# Patient Record
Sex: Female | Born: 1935 | Race: White | Hispanic: No | Smoking: Never smoker
Health system: Southern US, Community
[De-identification: ages and names within clinical notes are randomized; demographics above are authoritative.]

## PROBLEM LIST (undated history)

## (undated) DIAGNOSIS — M810 Age-related osteoporosis without current pathological fracture: Secondary | ICD-10-CM

## (undated) DIAGNOSIS — I219 Acute myocardial infarction, unspecified: Secondary | ICD-10-CM

## (undated) DIAGNOSIS — Z8614 Personal history of Methicillin resistant Staphylococcus aureus infection: Secondary | ICD-10-CM

## (undated) DIAGNOSIS — G4700 Insomnia, unspecified: Secondary | ICD-10-CM

## (undated) DIAGNOSIS — K2100 Gastro-esophageal reflux disease with esophagitis, without bleeding: Secondary | ICD-10-CM

## (undated) DIAGNOSIS — K219 Gastro-esophageal reflux disease without esophagitis: Secondary | ICD-10-CM

## (undated) DIAGNOSIS — R911 Solitary pulmonary nodule: Secondary | ICD-10-CM

## (undated) DIAGNOSIS — R0609 Other forms of dyspnea: Secondary | ICD-10-CM

## (undated) DIAGNOSIS — J90 Pleural effusion, not elsewhere classified: Secondary | ICD-10-CM

## (undated) DIAGNOSIS — F329 Major depressive disorder, single episode, unspecified: Secondary | ICD-10-CM

## (undated) DIAGNOSIS — J209 Acute bronchitis, unspecified: Secondary | ICD-10-CM

## (undated) DIAGNOSIS — R531 Weakness: Secondary | ICD-10-CM

## (undated) DIAGNOSIS — L02838 Carbuncle of other sites: Secondary | ICD-10-CM

## (undated) DIAGNOSIS — D72829 Elevated white blood cell count, unspecified: Secondary | ICD-10-CM

## (undated) DIAGNOSIS — J449 Chronic obstructive pulmonary disease, unspecified: Secondary | ICD-10-CM

## (undated) DIAGNOSIS — I4891 Unspecified atrial fibrillation: Secondary | ICD-10-CM

## (undated) DIAGNOSIS — S0100XA Unspecified open wound of scalp, initial encounter: Secondary | ICD-10-CM

## (undated) DIAGNOSIS — I517 Cardiomegaly: Secondary | ICD-10-CM

## (undated) DIAGNOSIS — R0989 Other specified symptoms and signs involving the circulatory and respiratory systems: Secondary | ICD-10-CM

## (undated) DIAGNOSIS — D649 Anemia, unspecified: Secondary | ICD-10-CM

## (undated) DIAGNOSIS — K209 Esophagitis, unspecified without bleeding: Secondary | ICD-10-CM

## (undated) DIAGNOSIS — R627 Adult failure to thrive: Secondary | ICD-10-CM

## (undated) DIAGNOSIS — R269 Unspecified abnormalities of gait and mobility: Secondary | ICD-10-CM

## (undated) DIAGNOSIS — H6120 Impacted cerumen, unspecified ear: Secondary | ICD-10-CM

## (undated) DIAGNOSIS — F411 Generalized anxiety disorder: Secondary | ICD-10-CM

## (undated) DIAGNOSIS — K56609 Unspecified intestinal obstruction, unspecified as to partial versus complete obstruction: Secondary | ICD-10-CM

## (undated) DIAGNOSIS — L02828 Furuncle of other sites: Secondary | ICD-10-CM

## (undated) DIAGNOSIS — I2510 Atherosclerotic heart disease of native coronary artery without angina pectoris: Secondary | ICD-10-CM

## (undated) DIAGNOSIS — I10 Essential (primary) hypertension: Secondary | ICD-10-CM

## (undated) DIAGNOSIS — K269 Duodenal ulcer, unspecified as acute or chronic, without hemorrhage or perforation: Secondary | ICD-10-CM

## (undated) DIAGNOSIS — K449 Diaphragmatic hernia without obstruction or gangrene: Secondary | ICD-10-CM

## (undated) DIAGNOSIS — W19XXXA Unspecified fall, initial encounter: Secondary | ICD-10-CM

## (undated) DIAGNOSIS — J4489 Other specified chronic obstructive pulmonary disease: Secondary | ICD-10-CM

## (undated) DIAGNOSIS — R609 Edema, unspecified: Secondary | ICD-10-CM

## (undated) DIAGNOSIS — F419 Anxiety disorder, unspecified: Secondary | ICD-10-CM

## (undated) DIAGNOSIS — F3289 Other specified depressive episodes: Secondary | ICD-10-CM

## (undated) DIAGNOSIS — J3489 Other specified disorders of nose and nasal sinuses: Secondary | ICD-10-CM

## (undated) DIAGNOSIS — E119 Type 2 diabetes mellitus without complications: Secondary | ICD-10-CM

## (undated) DIAGNOSIS — K5900 Constipation, unspecified: Secondary | ICD-10-CM

## (undated) DIAGNOSIS — R7989 Other specified abnormal findings of blood chemistry: Secondary | ICD-10-CM

## (undated) DIAGNOSIS — R259 Unspecified abnormal involuntary movements: Secondary | ICD-10-CM

## (undated) DIAGNOSIS — I509 Heart failure, unspecified: Secondary | ICD-10-CM

## (undated) DIAGNOSIS — R634 Abnormal weight loss: Secondary | ICD-10-CM

## (undated) HISTORY — DX: Furuncle of other sites: L02.828

## (undated) HISTORY — DX: Adult failure to thrive: R62.7

## (undated) HISTORY — DX: Other specified abnormal findings of blood chemistry: R79.89

## (undated) HISTORY — DX: Unspecified abnormal involuntary movements: R25.9

## (undated) HISTORY — DX: Personal history of Methicillin resistant Staphylococcus aureus infection: Z86.14

## (undated) HISTORY — DX: Pleural effusion, not elsewhere classified: J90

## (undated) HISTORY — DX: Insomnia, unspecified: G47.00

## (undated) HISTORY — DX: Unspecified fall, initial encounter: W19.XXXA

## (undated) HISTORY — DX: Esophagitis, unspecified: K20.9

## (undated) HISTORY — DX: Abnormal weight loss: R63.4

## (undated) HISTORY — DX: Elevated white blood cell count, unspecified: D72.829

## (undated) HISTORY — DX: Cardiomegaly: I51.7

## (undated) HISTORY — DX: Other specified depressive episodes: F32.89

## (undated) HISTORY — DX: Carbuncle of other sites: L02.838

## (undated) HISTORY — DX: Age-related osteoporosis without current pathological fracture: M81.0

## (undated) HISTORY — DX: Impacted cerumen, unspecified ear: H61.20

## (undated) HISTORY — DX: Unspecified abnormalities of gait and mobility: R26.9

## (undated) HISTORY — DX: Atherosclerotic heart disease of native coronary artery without angina pectoris: I25.10

## (undated) HISTORY — DX: Gastro-esophageal reflux disease with esophagitis, without bleeding: K21.00

## (undated) HISTORY — DX: Diaphragmatic hernia without obstruction or gangrene: K44.9

## (undated) HISTORY — DX: Weakness: R53.1

## (undated) HISTORY — DX: Gastro-esophageal reflux disease with esophagitis: K21.0

## (undated) HISTORY — DX: Unspecified intestinal obstruction, unspecified as to partial versus complete obstruction: K56.609

## (undated) HISTORY — DX: Essential (primary) hypertension: I10

## (undated) HISTORY — DX: Constipation, unspecified: K59.00

## (undated) HISTORY — DX: Other specified symptoms and signs involving the circulatory and respiratory systems: R06.09

## (undated) HISTORY — DX: Unspecified atrial fibrillation: I48.91

## (undated) HISTORY — DX: Chronic obstructive pulmonary disease, unspecified: J44.9

## (undated) HISTORY — DX: Major depressive disorder, single episode, unspecified: F32.9

## (undated) HISTORY — DX: Other specified chronic obstructive pulmonary disease: J44.89

## (undated) HISTORY — DX: Unspecified open wound of scalp, initial encounter: S01.00XA

## (undated) HISTORY — DX: Esophagitis, unspecified without bleeding: K20.90

## (undated) HISTORY — DX: Other specified symptoms and signs involving the circulatory and respiratory systems: R09.89

## (undated) HISTORY — DX: Acute bronchitis, unspecified: J20.9

## (undated) HISTORY — DX: Duodenal ulcer, unspecified as acute or chronic, without hemorrhage or perforation: K26.9

## (undated) HISTORY — DX: Type 2 diabetes mellitus without complications: E11.9

## (undated) HISTORY — PX: HIP SURGERY: SHX245

## (undated) HISTORY — DX: Solitary pulmonary nodule: R91.1

## (undated) HISTORY — DX: Other specified disorders of nose and nasal sinuses: J34.89

## (undated) HISTORY — DX: Generalized anxiety disorder: F41.1

---

## 1989-07-03 HISTORY — PX: ABDOMINAL HYSTERECTOMY: SHX81

## 1999-11-24 ENCOUNTER — Encounter: Admission: RE | Admit: 1999-11-24 | Discharge: 1999-11-24 | Payer: Self-pay | Admitting: Internal Medicine

## 1999-11-24 ENCOUNTER — Encounter: Payer: Self-pay | Admitting: Internal Medicine

## 2000-08-02 ENCOUNTER — Ambulatory Visit (HOSPITAL_COMMUNITY): Admission: RE | Admit: 2000-08-02 | Discharge: 2000-08-02 | Payer: Self-pay | Admitting: Internal Medicine

## 2000-08-02 ENCOUNTER — Encounter: Payer: Self-pay | Admitting: Internal Medicine

## 2001-01-08 ENCOUNTER — Encounter: Payer: Self-pay | Admitting: Internal Medicine

## 2001-01-08 ENCOUNTER — Encounter: Admission: RE | Admit: 2001-01-08 | Discharge: 2001-01-08 | Payer: Self-pay | Admitting: Internal Medicine

## 2002-01-16 ENCOUNTER — Encounter: Payer: Self-pay | Admitting: Internal Medicine

## 2002-01-16 ENCOUNTER — Encounter: Admission: RE | Admit: 2002-01-16 | Discharge: 2002-01-16 | Payer: Self-pay | Admitting: Internal Medicine

## 2002-04-25 ENCOUNTER — Encounter: Admission: RE | Admit: 2002-04-25 | Discharge: 2002-04-25 | Payer: Self-pay | Admitting: Internal Medicine

## 2002-04-25 ENCOUNTER — Encounter: Payer: Self-pay | Admitting: Internal Medicine

## 2003-10-30 ENCOUNTER — Encounter: Admission: RE | Admit: 2003-10-30 | Discharge: 2003-10-30 | Payer: Self-pay | Admitting: Internal Medicine

## 2005-08-25 ENCOUNTER — Encounter: Admission: RE | Admit: 2005-08-25 | Discharge: 2005-08-25 | Payer: Self-pay | Admitting: Internal Medicine

## 2009-07-03 HISTORY — PX: FRACTURE SURGERY: SHX138

## 2010-02-26 ENCOUNTER — Emergency Department (HOSPITAL_COMMUNITY): Admission: EM | Admit: 2010-02-26 | Discharge: 2010-02-26 | Payer: Self-pay | Admitting: Emergency Medicine

## 2010-02-27 ENCOUNTER — Inpatient Hospital Stay (HOSPITAL_COMMUNITY): Admission: EM | Admit: 2010-02-27 | Discharge: 2010-03-04 | Payer: Self-pay | Admitting: Emergency Medicine

## 2010-03-06 ENCOUNTER — Ambulatory Visit: Payer: Self-pay | Admitting: Internal Medicine

## 2010-03-06 ENCOUNTER — Inpatient Hospital Stay (HOSPITAL_COMMUNITY): Admission: EM | Admit: 2010-03-06 | Discharge: 2010-03-21 | Payer: Self-pay | Admitting: Emergency Medicine

## 2010-03-12 ENCOUNTER — Encounter (INDEPENDENT_AMBULATORY_CARE_PROVIDER_SITE_OTHER): Payer: Self-pay | Admitting: Internal Medicine

## 2010-03-12 ENCOUNTER — Encounter: Payer: Self-pay | Admitting: Internal Medicine

## 2010-04-13 ENCOUNTER — Ambulatory Visit (HOSPITAL_COMMUNITY): Admission: RE | Admit: 2010-04-13 | Discharge: 2010-04-13 | Payer: Self-pay | Admitting: Internal Medicine

## 2010-04-17 ENCOUNTER — Inpatient Hospital Stay (HOSPITAL_COMMUNITY): Admission: EM | Admit: 2010-04-17 | Discharge: 2010-04-19 | Payer: Self-pay | Admitting: Emergency Medicine

## 2010-05-31 ENCOUNTER — Ambulatory Visit: Payer: Self-pay | Admitting: Oncology

## 2010-06-14 LAB — CBC WITH DIFFERENTIAL/PLATELET
BASO%: 0.3 % (ref 0.0–2.0)
EOS%: 0.9 % (ref 0.0–7.0)
HCT: 33.9 % — ABNORMAL LOW (ref 34.8–46.6)
LYMPH%: 19.8 % (ref 14.0–49.7)
MCH: 26.3 pg (ref 25.1–34.0)
MCHC: 32.7 g/dL (ref 31.5–36.0)
MONO#: 1.5 10*3/uL — ABNORMAL HIGH (ref 0.1–0.9)
NEUT%: 72.5 % (ref 38.4–76.8)
Platelets: 505 10*3/uL — ABNORMAL HIGH (ref 145–400)

## 2010-06-14 LAB — COMPREHENSIVE METABOLIC PANEL
AST: 12 U/L (ref 0–37)
BUN: 28 mg/dL — ABNORMAL HIGH (ref 6–23)
Calcium: 9.3 mg/dL (ref 8.4–10.5)
Chloride: 104 mEq/L (ref 96–112)
Creatinine, Ser: 1.33 mg/dL — ABNORMAL HIGH (ref 0.40–1.20)

## 2010-06-17 LAB — BCR/ABL (LIO MMD)

## 2010-06-17 LAB — JAK-2 V617F

## 2010-07-13 ENCOUNTER — Ambulatory Visit: Payer: Self-pay | Admitting: Oncology

## 2010-07-15 LAB — CBC WITH DIFFERENTIAL/PLATELET
BASO%: 0.8 % (ref 0.0–2.0)
Basophils Absolute: 0.2 10*3/uL — ABNORMAL HIGH (ref 0.0–0.1)
EOS%: 0.9 % (ref 0.0–7.0)
Eosinophils Absolute: 0.2 10*3/uL (ref 0.0–0.5)
HCT: 34.8 % (ref 34.8–46.6)
HGB: 11.6 g/dL (ref 11.6–15.9)
LYMPH%: 29.1 % (ref 14.0–49.7)
MCH: 27 pg (ref 25.1–34.0)
MCHC: 33.3 g/dL (ref 31.5–36.0)
MCV: 80.9 fL (ref 79.5–101.0)
MONO#: 1.9 10*3/uL — ABNORMAL HIGH (ref 0.1–0.9)
MONO%: 7.9 % (ref 0.0–14.0)
NEUT#: 14.5 10*3/uL — ABNORMAL HIGH (ref 1.5–6.5)
NEUT%: 61.3 % (ref 38.4–76.8)
Platelets: 380 10*3/uL (ref 145–400)
RBC: 4.3 10*6/uL (ref 3.70–5.45)
RDW: 19.4 % — ABNORMAL HIGH (ref 11.2–14.5)
WBC: 23.6 10*3/uL — ABNORMAL HIGH (ref 3.9–10.3)
lymph#: 6.9 10*3/uL — ABNORMAL HIGH (ref 0.9–3.3)
nRBC: 0 % (ref 0–0)

## 2010-07-15 LAB — TECHNOLOGIST REVIEW

## 2010-07-16 ENCOUNTER — Inpatient Hospital Stay (HOSPITAL_COMMUNITY)
Admission: EM | Admit: 2010-07-16 | Discharge: 2010-07-20 | Disposition: A | Discharge status: Rehabilitation Facility | Payer: Self-pay | Source: Home / Self Care | Attending: Internal Medicine | Admitting: Internal Medicine

## 2010-07-18 LAB — CBC
HCT: 33.1 % — ABNORMAL LOW (ref 36.0–46.0)
HCT: 28.3 % — ABNORMAL LOW (ref 36.0–46.0)
Hemoglobin: 10.9 g/dL — ABNORMAL LOW (ref 12.0–15.0)
Hemoglobin: 9.2 g/dL — ABNORMAL LOW (ref 12.0–15.0)
MCH: 27.5 pg (ref 26.0–34.0)
MCH: 27.4 pg (ref 26.0–34.0)
MCHC: 32.9 g/dL (ref 30.0–36.0)
MCHC: 32.5 g/dL (ref 30.0–36.0)
MCV: 83.6 fL (ref 78.0–100.0)
MCV: 84.2 fL (ref 78.0–100.0)
Platelets: 396 10*3/uL (ref 150–400)
Platelets: 367 10*3/uL (ref 150–400)
RBC: 3.96 MIL/uL (ref 3.87–5.11)
RBC: 3.36 MIL/uL — ABNORMAL LOW (ref 3.87–5.11)
RDW: 19.2 % — ABNORMAL HIGH (ref 11.5–15.5)
RDW: 19.2 % — ABNORMAL HIGH (ref 11.5–15.5)
WBC: 21.1 10*3/uL — ABNORMAL HIGH (ref 4.0–10.5)
WBC: 20.2 10*3/uL — ABNORMAL HIGH (ref 4.0–10.5)

## 2010-07-18 LAB — URINALYSIS, ROUTINE W REFLEX MICROSCOPIC
Bilirubin Urine: NEGATIVE
Hgb urine dipstick: NEGATIVE
Ketones, ur: NEGATIVE mg/dL
Nitrite: NEGATIVE
Protein, ur: NEGATIVE mg/dL
Specific Gravity, Urine: 1.022 (ref 1.005–1.030)
Urine Glucose, Fasting: NEGATIVE mg/dL
Urobilinogen, UA: 0.2 mg/dL (ref 0.0–1.0)
pH: 5 (ref 5.0–8.0)

## 2010-07-18 LAB — DIFFERENTIAL
Basophils Absolute: 0.2 10*3/uL — ABNORMAL HIGH (ref 0.0–0.1)
Basophils Relative: 1 % (ref 0–1)
Eosinophils Absolute: 0.2 10*3/uL (ref 0.0–0.7)
Eosinophils Relative: 1 % (ref 0–5)
Lymphocytes Relative: 20 % (ref 12–46)
Lymphs Abs: 4.2 10*3/uL — ABNORMAL HIGH (ref 0.7–4.0)
Monocytes Absolute: 2.1 10*3/uL — ABNORMAL HIGH (ref 0.1–1.0)
Monocytes Relative: 10 % (ref 3–12)
Neutro Abs: 14.4 10*3/uL — ABNORMAL HIGH (ref 1.7–7.7)
Neutrophils Relative %: 68 % (ref 43–77)

## 2010-07-18 LAB — COMPREHENSIVE METABOLIC PANEL
ALT: 16 U/L (ref 0–35)
AST: 21 U/L (ref 0–37)
Albumin: 3.3 g/dL — ABNORMAL LOW (ref 3.5–5.2)
Alkaline Phosphatase: 76 U/L (ref 39–117)
BUN: 31 mg/dL — ABNORMAL HIGH (ref 6–23)
CO2: 24 mEq/L (ref 19–32)
Calcium: 8.9 mg/dL (ref 8.4–10.5)
Chloride: 108 mEq/L (ref 96–112)
Creatinine, Ser: 1.21 mg/dL — ABNORMAL HIGH (ref 0.4–1.2)
GFR calc Af Amer: 50 mL/min — ABNORMAL LOW (ref >60)
GFR calc non Af Amer: 42 mL/min — ABNORMAL LOW (ref >60)
Glucose, Bld: 138 mg/dL — ABNORMAL HIGH (ref 70–99)
Potassium: 4.5 mEq/L (ref 3.5–5.1)
Sodium: 139 mEq/L (ref 135–145)
Total Bilirubin: 0.6 mg/dL (ref 0.3–1.2)
Total Protein: 6.2 g/dL (ref 6.0–8.3)

## 2010-07-18 LAB — PROTIME-INR
INR: 1.01 (ref 0.00–1.49)
Prothrombin Time: 13.5 seconds (ref 11.6–15.2)

## 2010-07-18 LAB — URINE MICROSCOPIC-ADD ON

## 2010-07-18 LAB — BASIC METABOLIC PANEL
BUN: 27 mg/dL — ABNORMAL HIGH (ref 6–23)
CO2: 24 mEq/L (ref 19–32)
Calcium: 8.6 mg/dL (ref 8.4–10.5)
Chloride: 106 mEq/L (ref 96–112)
Creatinine, Ser: 1.28 mg/dL — ABNORMAL HIGH (ref 0.4–1.2)
GFR calc Af Amer: 47 mL/min — ABNORMAL LOW (ref >60)
GFR calc non Af Amer: 39 mL/min — ABNORMAL LOW (ref >60)
Glucose, Bld: 139 mg/dL — ABNORMAL HIGH (ref 70–99)
Potassium: 4.4 mEq/L (ref 3.5–5.1)
Sodium: 138 mEq/L (ref 135–145)

## 2010-07-18 LAB — MRSA PCR SCREENING: MRSA by PCR: POSITIVE — AB

## 2010-07-20 LAB — PROTIME-INR
INR: 1.18 (ref 0.00–1.49)
INR: 1.14 (ref 0.00–1.49)
Prothrombin Time: 15.2 seconds (ref 11.6–15.2)
Prothrombin Time: 14.8 seconds (ref 11.6–15.2)

## 2010-07-20 LAB — URINE CULTURE
Colony Count: NO GROWTH
Culture  Setup Time: 201201150322
Culture: NO GROWTH

## 2010-07-20 LAB — CBC
HCT: 25.5 % — ABNORMAL LOW (ref 36.0–46.0)
HCT: 22.6 % — ABNORMAL LOW (ref 36.0–46.0)
Hemoglobin: 8.2 g/dL — ABNORMAL LOW (ref 12.0–15.0)
Hemoglobin: 7.2 g/dL — ABNORMAL LOW (ref 12.0–15.0)
MCH: 27.2 pg (ref 26.0–34.0)
MCH: 27.4 pg (ref 26.0–34.0)
MCHC: 32.2 g/dL (ref 30.0–36.0)
MCHC: 31.9 g/dL (ref 30.0–36.0)
MCV: 84.7 fL (ref 78.0–100.0)
MCV: 85.9 fL (ref 78.0–100.0)
Platelets: 283 10*3/uL (ref 150–400)
Platelets: 271 10*3/uL (ref 150–400)
RBC: 3.01 MIL/uL — ABNORMAL LOW (ref 3.87–5.11)
RBC: 2.63 MIL/uL — ABNORMAL LOW (ref 3.87–5.11)
RDW: 19.3 % — ABNORMAL HIGH (ref 11.5–15.5)
RDW: 19.3 % — ABNORMAL HIGH (ref 11.5–15.5)
WBC: 20.5 10*3/uL — ABNORMAL HIGH (ref 4.0–10.5)
WBC: 21.5 10*3/uL — ABNORMAL HIGH (ref 4.0–10.5)

## 2010-07-20 LAB — BASIC METABOLIC PANEL
BUN: 17 mg/dL (ref 6–23)
BUN: 19 mg/dL (ref 6–23)
CO2: 25 mEq/L (ref 19–32)
CO2: 25 mEq/L (ref 19–32)
Calcium: 8.5 mg/dL (ref 8.4–10.5)
Calcium: 8.4 mg/dL (ref 8.4–10.5)
Chloride: 103 mEq/L (ref 96–112)
Chloride: 108 mEq/L (ref 96–112)
Creatinine, Ser: 1.2 mg/dL (ref 0.4–1.2)
Creatinine, Ser: 1.2 mg/dL (ref 0.4–1.2)
GFR calc Af Amer: 51 mL/min — ABNORMAL LOW (ref >60)
GFR calc Af Amer: 51 mL/min — ABNORMAL LOW (ref >60)
GFR calc non Af Amer: 42 mL/min — ABNORMAL LOW (ref >60)
GFR calc non Af Amer: 42 mL/min — ABNORMAL LOW (ref >60)
Glucose, Bld: 158 mg/dL — ABNORMAL HIGH (ref 70–99)
Glucose, Bld: 163 mg/dL — ABNORMAL HIGH (ref 70–99)
Potassium: 4.3 mEq/L (ref 3.5–5.1)
Potassium: 4.4 mEq/L (ref 3.5–5.1)
Sodium: 136 mEq/L (ref 135–145)
Sodium: 138 mEq/L (ref 135–145)

## 2010-07-22 NOTE — H&P (Signed)
NAME:  Bianca Khan, Bianca Khan                  ACCOUNT NO.:  [id number]  MEDICAL RECORD NO.:  [id number]          PATIENT TYPE:  INP  LOCATION:  0102                         FACILITY:  [facility]  PHYSICIAN:  Ruthy Dick, MD    DATE OF BIRTH:  12/12/1914  DATE OF ADMISSION:  07/16/2010 DATE OF DISCHARGE:                             HISTORY & PHYSICAL   The patient seen and examined in the emergency room.  CHIEF COMPLAINT:  Fall with injury to right hip today.  PRIMARY CARE PHYSICIAN:  Bufford Spikes, DO  HISTORY OF PRESENTING ILLNESS:  Ms. Jeancharles is a pleasant 77 year old lady with past medical history significant for chronic leukocytosis, chronic anemia, COPD, hypertension, several admissions last year for healthcare-associated pneumonia, coronary artery disease, and admission for left hip fracture last year in October.  This lady presented to the hospital today after falling in the assisted living facility where she stays.  According to her, she was watching the game on television and for some reason, she felt that somebody was at the door knocking and she decided to go to door with her walker.  While trying to negotiate a bend going towards the door, she slipped over and fell on the right hip. According to her, there were no other problems before this. Specifically, she had no chest pain and no other shortness of breath apart from what she gets with COPD.  She denies having any palpitations. Denied blacking out during this episode.  Denies fever or chills. Denies dysuria, frequency, and urgency.  Denies diarrhea, constipation, melanotic stool, or hematochezia.  Also denies nausea and vomiting.  PAST MEDICAL HISTORY: 1. Left hip fracture, October last year. 2. Recurrent admission for healthcare-associated pneumonia, last year. 3. Coronary artery disease. 4. COPD. 5. Chronic anemia. 6. Chronic leukocytosis of unclear etiology. 7. Anxiety disorder. 8. Hypertension. 9. History of upper  GI bleed secondary to ulcer.  SOCIAL HISTORY:  Resident of assisted living facility.  Denies alcohol, tobacco, and illicit drug use.  MEDICATIONS:  She is on, 1. Tylenol 650 mg at q.4 hours as needed for pain. 2. Alprazolam 0.5 mg every 12 hours as needed for anxiety. 3. Omeprazole 20 mg b.i.d. 4. Methocarbamol 500 mg b.i.d. 5. Carafate 1 g b.i.d. 6. Spiriva 1 inhale daily. 7. Megace 400 mg daily. 8. Cardizem 240 mg daily.  FAMILY HISTORY:  Father died of coronary artery disease, otherwise noncontributory.  ALLERGIES:  No known drug allergies.  REVIEW OF SYSTEMS:  All system reviewed, but negative except noted in the history of present illness.  PHYSICAL EXAMINATION:  The patient seen and examined in the emergency room. GENERAL:  She is alert and oriented x3, in no acute cardiopulmonary distress, in no painful distress either. HEENT:  Normocephalic, atraumatic.  Pupils equal, round, reactive to light.  Extraocular muscles intact.  Nares patent. NECK:  Supple.  No JVD.  No lymphadenopathy.  No thyromegaly. CHEST:  Clear to auscultation bilaterally.  No rhonchi.  No rales.  No wheezing. ABDOMEN:  Soft, nontender.  No hepatosplenomegaly. EXTREMITIES:  No clubbing.  No cyanosis.  No edema. CARDIOVASCULAR:  Some first and  second heart sounds only. CENTRAL NERVOUS SYSTEM:  Nonfocal.  Cranial nerves intact II through XII.  LABORATORY DATA AND INVESTIGATIONS:  WBC is 21.1 this is close to her baseline, hemoglobin is 10.9, hematocrit is 33.  Sodium 139, potassium 4.5, glucose 138, BUN 31, creatinine 1.21.  EKG shows sinus rhythm with some Q waves noted in the inferior leads.  No old EKG to compare at this time, but there is no acute ST-T wave abnormality noted at this time. There is also a slight left axis deviation.  Chest x-ray shows no acute disease at this time.  X-ray of the right hip shows acute right intertrochanteric fracture and old left femoral neck fracture.  DEXA scan  recommended and to be done as an outpatient by primary care physician.  ASSESSMENT: 1. Right femoral neck fracture. 2. History of left hip femoral neck fracture. 3. History of coronary artery disease. 4. Chronic obstructive pulmonary disease. 5. History of upper gastrointestinal bleed secondary to duodenal     ulcers. 6. Dehydration. 7. Chronic anemia. 8. Persistent leukocytosis of unclear etiology. 9. Anxiety disorder. 10.Hypertension.  PLAN OF CARE:  The patient will be admitted and DVT prophylaxis will be provided.  Pain management will also be covered.  We will give her nebulizations as needed.  Orthopedic consult is already in place and the patient was seen by Orthopedic and is being planned for an open reduction and internal fixation.  At this stage, we do realize that patient has mild-to-moderate risk as far as this surgery is concerned and we have discussed these risks with the patient and the patient's daughter.  They do understand and willing to proceed with the surgery. With this in mind, we approve this surgery having the above risk in mind and the EKG has been seen not to show any acute findings.  The patient also had a 2-D echo recently, which showed a normal ejection fraction. At that time, in August 2011, the ejection fraction was 55% to 60%.  The patient has no chest pain and shortness of breath is at baseline because of her COPD.  The patient remains a DNI/DNR, but this will be put aside if the patient must have some surgery.  I have also discussed this with the patient's daughter and the patient and they are both in agreement.  Total time used for admitting the patient is 1 hour.     Ruthy Dick, MD     GU/MEDQ  D:  07/16/2010  T:  07/16/2010  Job:  [id number]  cc:   Bufford Spikes, DO  Electronically Signed by Ruthy Dick  on 07/21/2010 06:58:38 PM

## 2010-07-23 ENCOUNTER — Encounter (HOSPITAL_BASED_OUTPATIENT_CLINIC_OR_DEPARTMENT_OTHER): Payer: Self-pay | Admitting: Internal Medicine

## 2010-07-24 ENCOUNTER — Encounter: Payer: Self-pay | Admitting: Internal Medicine

## 2010-07-25 LAB — TYPE AND SCREEN
ABO/RH(D): O POS
Antibody Screen: NEGATIVE
Unit division: 0
Unit division: 0
Unit division: 0

## 2010-07-25 LAB — BASIC METABOLIC PANEL
BUN: 20 mg/dL (ref 6–23)
Calcium: 8.5 mg/dL (ref 8.4–10.5)
Creatinine, Ser: 1.05 mg/dL (ref 0.4–1.2)
GFR calc non Af Amer: 49 mL/min — ABNORMAL LOW (ref >60)
Glucose, Bld: 139 mg/dL — ABNORMAL HIGH (ref 70–99)

## 2010-07-25 LAB — CBC
MCH: 28.6 pg (ref 26.0–34.0)
MCHC: 33.5 g/dL (ref 30.0–36.0)
Platelets: 277 10*3/uL (ref 150–400)
RBC: 3.98 MIL/uL (ref 3.87–5.11)

## 2010-07-25 LAB — PROTIME-INR: Prothrombin Time: 16 seconds — ABNORMAL HIGH (ref 11.6–15.2)

## 2010-07-25 NOTE — Discharge Summary (Addendum)
NAME:  Bianca Khan, Bianca Khan                  ACCOUNT NO.:  [id number]  MEDICAL RECORD NO.:  [id number]          PATIENT TYPE:  INP  LOCATION:  1612                         FACILITY:  [facility]  PHYSICIAN:  Erick Blinks, MD     DATE OF BIRTH:  July 10, 1914  DATE OF ADMISSION:  07/16/2010 DATE OF DISCHARGE:                              DISCHARGE SUMMARY   PRIMARY CARE PHYSICIAN:  Tiffany Renato Gails, D. O.  DISCHARGE DIAGNOSES: 1. Right hip intertrochanteric fracture status post open reduction     internal fixation by Dr. Turner Daniels, status post fall, mechanical. 2. Urinary tract infection. 3. Coronary artery disease. 4. Chronic leukocytosis without focus of infection, improving. 5. Acute blood loss anemia, improved after 2 units of packed red blood     cells. 6. Hypertension. 7. Chronic obstructive pulmonary disease. 8. Deconditioning.  DISCHARGE MEDICATIONS: 1. Albuterol nebulizers q.6 h p.r.n. 2. Vicodin 5/325 mg 1-2 tablets p.o. q.4 h p.r.n. 3. Ferrous sulfate 325 mg p.o. t.i.d. 4. Robaxin 500 mg p.o. q.6 h p.r.n. 5. MiraLax 17 grams p.o. daily p.r.n. 6. Lovenox 30 mg subcutaneous q.24 h until INR between 2 to 2.5. 7. Coumadin 4 mg p.o. daily for DVT prophylaxis. 8. Diltiazem CD 240 mg p.o. daily. 9. Spiriva 18 mcg 1 capsule p.o. daily. 10.Omeprazole 20 mg p.o. b.i.d. 11.Carafate 10 mL p.o. b.i.d. 12.Tylenol 325 mg 2 tablets p.o. q.4 h p.r.n. 13.Megace 10 mL p.o. daily. 14.Alprazolam 0.5 mg 1 tablet p.o. q.12 h p.r.n. 15.Viactiv chews one tablet p.o. b.i.d.  ADMISSION HISTORY:  This is a 77 year old female with history of chronic leukocytosis, anemia, COPD, hypertension, coronary artery disease who presents to the emergency room after having a fall at her assisted- living facility.  The patient had a mechanical fall. In the ER she was found to have a right hip fracture was subsequently referred for admission. For further details please refer the history and physical dictated by Dr. Abram Sander  on January 14th.  HOSPITAL COURSE: 1. Right hip fracture.  The patient was seen in consultation by Dr.     Turner Daniels from Orthopedics and underwent open reduction internal     fixation on January 15th without complications.  She will need to     follow up with orthopedic service after 10 days. 2. Urinary tract infection. The patient initially had a positive UA on     admission. Her urine culture did not show any growth.  She has     received 5 days of Rocephin and is currently afebrile. We will not     continue any further antibiotics at this time. 3. Anemia.  The patient had a hemoglobin of 10.9 on admission.  This     has trended down to 7.2 postoperatively. She was transfused a total     of 2 units of PRBCs and currently her hemoglobin is 11.4.  This can     further be followed as an outpatient. 4. Chronic leukocytosis.  The patient had a white count of 21.1 on     admission.  Currently this is trended down to 16.1.  Looking back  her leukocytosis has chronically ranged between 14 and 20.  This     can be further followed as an outpatient.  CONSULTATIONS:  Orthopedics, Dr. Turner Daniels.  PROCEDURES:  Open reduction internal fixation of the right hip on January 14th.  DIAGNOSTIC IMAGING: 1. Chest x-ray January 14th shows no acute disease. 2. X-ray of the right hip on January 14th shows acute right     intertrochanteric fracture, old left femoral neck fracture. 3. X-ray of the right hip on January 15th shows post ORIF of     intertrochanteric fracture right femur.  DISCHARGE INSTRUCTIONS:  The patient should continue on a heart-healthy low-calorie diet, conduct her activity as tolerated.  Her weightbearing status is touchdown weightbearing.  She will follow up with orthopedic service in the next 10 days.  She will need follow-up with primary care physician once she is discharged from skilled nursing facility.  Plan was discussed with the patient as well as her son who are in  agreement.     Erick Blinks, MD     JM/MEDQ  D:  07/20/2010  T:  07/20/2010  Job:  [id number]  cc:   Bufford Spikes, DO  Electronically Signed by Erick Blinks  on 07/25/2010 12:43:35 PM

## 2010-08-02 NOTE — Procedures (Signed)
Summary: Upper Endoscopy  Patient: Bianca Khan Note: All result statuses are Final unless otherwise noted.  Tests: (1) Upper Endoscopy (EGD)   EGD Upper Endoscopy       DONE     [facility]     [address]     [place], [place]  [address]           ENDOSCOPY PROCEDURE REPORT           PATIENT:  Bianca, Khan  MR#:  [id number]     BIRTHDATE:  August 21, 1914, 89 yrs. old  GENDER:  female           ENDOSCOPIST:  Hedwig Morton. Juanda Chance, MD     Referred by:  Jeani Hawking, M.D.           PROCEDURE DATE:  03/12/2010     PROCEDURE:  EGD with biopsy     ASA CLASS:  Class III     INDICATIONS:  anemia gradual drop in H/H 13 to 6.7. Hgb 9.6 today     after a blood Tx, pt at [facility] with pneumonia           MEDICATIONS:   Versed 2 mg, Fentanyl 12 mcg     TOPICAL ANESTHETIC:  Cetacaine Spray           DESCRIPTION OF PROCEDURE:   After the risks benefits and     alternatives of the procedure were thoroughly explained, informed     consent was obtained.  The EG-[id number] ([id number]) endoscope was     introduced through the mouth and advanced to the second portion of     the duodenum, without limitations.  The instrument was slowly     withdrawn as the mucosa was fully examined.     <<PROCEDUREIMAGES>>           Esophagitis was found in the mid esophagus (see image017). severe     chronic and acute esophagitis due to free reflux into the     esophagus, increase fibrosis  A stricture was found in the distal     esophagus. fibrotid benign appearing concentric stricture at g-e     junction at 31 cm, admitted the scope without difficulty, friable     mucosa at the stricture With standard forceps, a biopsy was     obtained and sent to pathology (see image014, image015, image016,     image013, image012, and image004).  A hiatal hernia was found (see     image003). large nonreducible hiatal hernia 31-38 cm, containing     blood  Multiple ulcers were found in the bulb of the duodenum (see  image010, image009, image008, image007, and image006). two 1 cm     clean base ulcer in the bulb and several erosions, no stigmata of     bleeding,no blood in the duodenum  The stomach was entered and     closely examined. The antrum, angularis, and lesser curvature were     well visualized, including a retroflexed view of the cardia and     fundus. The stomach wall was normally distensable. The scope     passed easily through the pylorus into the duodenum. With standard     forceps, a biopsy was obtained and sent to pathology. r/o H (see     image011 and image005).Pylori    Retroflexed views revealed no     abnormalities.    The scope was then withdrawn from the patient  and the procedure completed.           COMPLICATIONS:  None           ENDOSCOPIC IMPRESSION:     1) Esophagitis in the mid esophagus     2) Stricture in the distal esophagus     3) Hiatal hernia     4) Ulcers, multiple in the bulb of duodenum     5) Normal stomach     low grade gi bleed from esophagitis, giant hiatal hernia is     contributing to the GERD and possible aspiration     RECOMMENDATIONS:     1) Await pathology results     2) Anti-reflux regimen to be follow     strict HOB elevation     Carafate slurry     long term PPI's, may need speech path eval for aspirattion           REPEAT EXAM:  In 0 year(s) for.           ______________________________     Hedwig Morton. Juanda Chance, MD           CC:           n.     eSIGNED:   Hedwig Morton. Brodie at 03/12/2010 09:34 AM           Page 2 of 3   Floyd, [place], [phone]  Note: An exclamation mark (!) indicates a result that was not dispersed into the flowsheet. Document Creation Date: 03/14/2010 8:57 AM _______________________________________________________________________  (1) Order result status: Final Collection or observation date-time: 03/12/2010 09:18 Requested date-time:  Receipt date-time:  Reported date-time:  Referring Physician:   Ordering Physician:  Lina Sar [id number]) Specimen Source:  Source: Launa Grill Order Number: [id number] Lab site:

## 2010-08-25 ENCOUNTER — Other Ambulatory Visit: Payer: Self-pay | Admitting: Surgery

## 2010-09-14 LAB — URINALYSIS, ROUTINE W REFLEX MICROSCOPIC
Bilirubin Urine: NEGATIVE
Nitrite: NEGATIVE
Specific Gravity, Urine: 1.014 (ref 1.005–1.030)
pH: 6.5 (ref 5.0–8.0)

## 2010-09-14 LAB — BASIC METABOLIC PANEL
BUN: 7 mg/dL (ref 6–23)
CO2: 29 mEq/L (ref 19–32)
Calcium: 8.3 mg/dL — ABNORMAL LOW (ref 8.4–10.5)
Creatinine, Ser: 0.89 mg/dL (ref 0.4–1.2)
GFR calc Af Amer: >60 mL/min (ref >60)
GFR calc non Af Amer: 57 mL/min — ABNORMAL LOW (ref >60)
Glucose, Bld: 119 mg/dL — ABNORMAL HIGH (ref 70–99)
Potassium: 3.9 mEq/L (ref 3.5–5.1)
Sodium: 138 mEq/L (ref 135–145)

## 2010-09-14 LAB — MAGNESIUM
Magnesium: 1.5 mg/dL (ref 1.5–2.5)
Magnesium: 1.5 mg/dL (ref 1.5–2.5)

## 2010-09-14 LAB — URINE CULTURE: Culture  Setup Time: 201110161142

## 2010-09-14 LAB — ABO/RH: ABO/RH(D): O POS

## 2010-09-14 LAB — CBC
HCT: 24.5 % — ABNORMAL LOW (ref 36.0–46.0)
Hemoglobin: 8.3 g/dL — ABNORMAL LOW (ref 12.0–15.0)
Hemoglobin: 9.7 g/dL — ABNORMAL LOW (ref 12.0–15.0)
MCH: 28.6 pg (ref 26.0–34.0)
MCH: 28.6 pg (ref 26.0–34.0)
MCHC: 33.9 g/dL (ref 30.0–36.0)
MCV: 86.4 fL (ref 78.0–100.0)
Platelets: 534 10*3/uL — ABNORMAL HIGH (ref 150–400)
RBC: 2.85 MIL/uL — ABNORMAL LOW (ref 3.87–5.11)
RBC: 3.3 MIL/uL — ABNORMAL LOW (ref 3.87–5.11)
RBC: 3.4 MIL/uL — ABNORMAL LOW (ref 3.87–5.11)
RDW: 15.1 % (ref 11.5–15.5)
WBC: 17.2 10*3/uL — ABNORMAL HIGH (ref 4.0–10.5)

## 2010-09-14 LAB — DIFFERENTIAL
Basophils Absolute: 0.1 10*3/uL (ref 0.0–0.1)
Eosinophils Relative: 9 % — ABNORMAL HIGH (ref 0–5)
Lymphocytes Relative: 10 % — ABNORMAL LOW (ref 12–46)
Lymphocytes Relative: 11 % — ABNORMAL LOW (ref 12–46)
Lymphs Abs: 1.7 10*3/uL (ref 0.7–4.0)
Monocytes Absolute: 1.4 10*3/uL — ABNORMAL HIGH (ref 0.1–1.0)
Monocytes Relative: 8 % (ref 3–12)
Neutro Abs: 11.8 10*3/uL — ABNORMAL HIGH (ref 1.7–7.7)
Neutro Abs: 13.2 10*3/uL — ABNORMAL HIGH (ref 1.7–7.7)

## 2010-09-14 LAB — COMPREHENSIVE METABOLIC PANEL
BUN: 6 mg/dL (ref 6–23)
CO2: 28 mEq/L (ref 19–32)
Chloride: 101 mEq/L (ref 96–112)
Creatinine, Ser: 0.96 mg/dL (ref 0.4–1.2)
GFR calc non Af Amer: 54 mL/min — ABNORMAL LOW (ref >60)
Total Bilirubin: 0.7 mg/dL (ref 0.3–1.2)

## 2010-09-14 LAB — TYPE AND SCREEN
Antibody Screen: NEGATIVE
Unit division: 0

## 2010-09-14 LAB — APTT: aPTT: 37 seconds (ref 24–37)

## 2010-09-14 LAB — TSH: TSH: 1.987 u[IU]/mL (ref 0.350–4.500)

## 2010-09-15 LAB — EXPECTORATED SPUTUM ASSESSMENT W GRAM STAIN, RFLX TO RESP C

## 2010-09-15 LAB — LIPID PANEL
Total CHOL/HDL Ratio: 3.4 RATIO
VLDL: 27 mg/dL (ref 0–40)
VLDL: 37 mg/dL (ref 0–40)

## 2010-09-15 LAB — CBC
HCT: 23.2 % — ABNORMAL LOW (ref 36.0–46.0)
HCT: 24.6 % — ABNORMAL LOW (ref 36.0–46.0)
HCT: 20.6 % — ABNORMAL LOW (ref 36.0–46.0)
HCT: 23.7 % — ABNORMAL LOW (ref 36.0–46.0)
HCT: 26.8 % — ABNORMAL LOW (ref 36.0–46.0)
HCT: 28.3 % — ABNORMAL LOW (ref 36.0–46.0)
HCT: 28.6 % — ABNORMAL LOW (ref 36.0–46.0)
HCT: 29.5 % — ABNORMAL LOW (ref 36.0–46.0)
HCT: 33.8 % — ABNORMAL LOW (ref 36.0–46.0)
HCT: 34.5 % — ABNORMAL LOW (ref 36.0–46.0)
Hemoglobin: 7.7 g/dL — ABNORMAL LOW (ref 12.0–15.0)
Hemoglobin: 8 g/dL — ABNORMAL LOW (ref 12.0–15.0)
Hemoglobin: 8.1 g/dL — ABNORMAL LOW (ref 12.0–15.0)
Hemoglobin: 10.1 g/dL — ABNORMAL LOW (ref 12.0–15.0)
Hemoglobin: 10.2 g/dL — ABNORMAL LOW (ref 12.0–15.0)
Hemoglobin: 11.1 g/dL — ABNORMAL LOW (ref 12.0–15.0)
Hemoglobin: 11.3 g/dL — ABNORMAL LOW (ref 12.0–15.0)
Hemoglobin: 6.7 g/dL — CL (ref 12.0–15.0)
Hemoglobin: 8.8 g/dL — ABNORMAL LOW (ref 12.0–15.0)
Hemoglobin: 8.9 g/dL — ABNORMAL LOW (ref 12.0–15.0)
Hemoglobin: 9 g/dL — ABNORMAL LOW (ref 12.0–15.0)
Hemoglobin: 9.6 g/dL — ABNORMAL LOW (ref 12.0–15.0)
MCH: 30.2 pg (ref 26.0–34.0)
MCH: 30.2 pg (ref 26.0–34.0)
MCH: 30.8 pg (ref 26.0–34.0)
MCH: 30.8 pg (ref 26.0–34.0)
MCH: 31.4 pg (ref 26.0–34.0)
MCH: 30.4 pg (ref 26.0–34.0)
MCH: 30.5 pg (ref 26.0–34.0)
MCH: 30.8 pg (ref 26.0–34.0)
MCH: 31 pg (ref 26.0–34.0)
MCH: 31 pg (ref 26.0–34.0)
MCH: 31 pg (ref 26.0–34.0)
MCH: 31.4 pg (ref 26.0–34.0)
MCH: 31.5 pg (ref 26.0–34.0)
MCHC: 32.9 g/dL (ref 30.0–36.0)
MCHC: 33.2 g/dL (ref 30.0–36.0)
MCHC: 33.8 g/dL (ref 30.0–36.0)
MCHC: 33.8 g/dL (ref 30.0–36.0)
MCHC: 34.3 g/dL (ref 30.0–36.0)
MCHC: 32.5 g/dL (ref 30.0–36.0)
MCHC: 32.8 g/dL (ref 30.0–36.0)
MCHC: 32.9 g/dL (ref 30.0–36.0)
MCHC: 33.2 g/dL (ref 30.0–36.0)
MCHC: 33.6 g/dL (ref 30.0–36.0)
MCHC: 33.6 g/dL (ref 30.0–36.0)
MCHC: 33.9 g/dL (ref 30.0–36.0)
MCHC: 33.9 g/dL (ref 30.0–36.0)
MCV: 91 fL (ref 78.0–100.0)
MCV: 91.8 fL (ref 78.0–100.0)
MCV: 90.9 fL (ref 78.0–100.0)
MCV: 91.1 fL (ref 78.0–100.0)
MCV: 91.8 fL (ref 78.0–100.0)
MCV: 91.9 fL (ref 78.0–100.0)
MCV: 92.6 fL (ref 78.0–100.0)
MCV: 93.5 fL (ref 78.0–100.0)
MCV: 93.8 fL (ref 78.0–100.0)
MCV: 95.4 fL (ref 78.0–100.0)
Platelets: 417 10*3/uL — ABNORMAL HIGH (ref 150–400)
Platelets: 483 10*3/uL — ABNORMAL HIGH (ref 150–400)
Platelets: 295 10*3/uL (ref 150–400)
Platelets: 305 10*3/uL (ref 150–400)
Platelets: 323 10*3/uL (ref 150–400)
Platelets: 325 10*3/uL (ref 150–400)
Platelets: 347 10*3/uL (ref 150–400)
Platelets: 351 10*3/uL (ref 150–400)
Platelets: 355 10*3/uL (ref 150–400)
Platelets: 355 10*3/uL (ref 150–400)
Platelets: 356 10*3/uL (ref 150–400)
RBC: 2.86 MIL/uL — ABNORMAL LOW (ref 3.87–5.11)
RBC: 2.56 MIL/uL — ABNORMAL LOW (ref 3.87–5.11)
RBC: 2.86 MIL/uL — ABNORMAL LOW (ref 3.87–5.11)
RBC: 2.92 MIL/uL — ABNORMAL LOW (ref 3.87–5.11)
RBC: 2.96 MIL/uL — ABNORMAL LOW (ref 3.87–5.11)
RBC: 3.25 MIL/uL — ABNORMAL LOW (ref 3.87–5.11)
RDW: 14.6 % (ref 11.5–15.5)
RDW: 14.7 % (ref 11.5–15.5)
RDW: 12.4 % (ref 11.5–15.5)
RDW: 12.5 % (ref 11.5–15.5)
RDW: 12.6 % (ref 11.5–15.5)
RDW: 12.7 % (ref 11.5–15.5)
RDW: 13.2 % (ref 11.5–15.5)
RDW: 14 % (ref 11.5–15.5)
RDW: 14.7 % (ref 11.5–15.5)
RDW: 15.1 % (ref 11.5–15.5)
RDW: 15.1 % (ref 11.5–15.5)
RDW: 15.3 % (ref 11.5–15.5)
RDW: 15.4 % (ref 11.5–15.5)
WBC: 14.7 10*3/uL — ABNORMAL HIGH (ref 4.0–10.5)
WBC: 17.4 10*3/uL — ABNORMAL HIGH (ref 4.0–10.5)
WBC: 18.3 10*3/uL — ABNORMAL HIGH (ref 4.0–10.5)
WBC: 18.6 10*3/uL — ABNORMAL HIGH (ref 4.0–10.5)
WBC: 23.5 10*3/uL — ABNORMAL HIGH (ref 4.0–10.5)
WBC: 23.8 10*3/uL — ABNORMAL HIGH (ref 4.0–10.5)
WBC: 23.9 10*3/uL — ABNORMAL HIGH (ref 4.0–10.5)
WBC: 26.5 10*3/uL — ABNORMAL HIGH (ref 4.0–10.5)
WBC: 29.2 10*3/uL — ABNORMAL HIGH (ref 4.0–10.5)
WBC: 32.8 10*3/uL — ABNORMAL HIGH (ref 4.0–10.5)

## 2010-09-15 LAB — GLUCOSE, CAPILLARY
Glucose-Capillary: 101 mg/dL — ABNORMAL HIGH (ref 70–99)
Glucose-Capillary: 108 mg/dL — ABNORMAL HIGH (ref 70–99)
Glucose-Capillary: 110 mg/dL — ABNORMAL HIGH (ref 70–99)
Glucose-Capillary: 111 mg/dL — ABNORMAL HIGH (ref 70–99)
Glucose-Capillary: 144 mg/dL — ABNORMAL HIGH (ref 70–99)
Glucose-Capillary: 152 mg/dL — ABNORMAL HIGH (ref 70–99)
Glucose-Capillary: 96 mg/dL (ref 70–99)
Glucose-Capillary: 101 mg/dL — ABNORMAL HIGH (ref 70–99)
Glucose-Capillary: 102 mg/dL — ABNORMAL HIGH (ref 70–99)
Glucose-Capillary: 103 mg/dL — ABNORMAL HIGH (ref 70–99)
Glucose-Capillary: 119 mg/dL — ABNORMAL HIGH (ref 70–99)
Glucose-Capillary: 120 mg/dL — ABNORMAL HIGH (ref 70–99)
Glucose-Capillary: 131 mg/dL — ABNORMAL HIGH (ref 70–99)
Glucose-Capillary: 137 mg/dL — ABNORMAL HIGH (ref 70–99)
Glucose-Capillary: 138 mg/dL — ABNORMAL HIGH (ref 70–99)
Glucose-Capillary: 139 mg/dL — ABNORMAL HIGH (ref 70–99)
Glucose-Capillary: 139 mg/dL — ABNORMAL HIGH (ref 70–99)
Glucose-Capillary: 140 mg/dL — ABNORMAL HIGH (ref 70–99)
Glucose-Capillary: 148 mg/dL — ABNORMAL HIGH (ref 70–99)
Glucose-Capillary: 149 mg/dL — ABNORMAL HIGH (ref 70–99)
Glucose-Capillary: 153 mg/dL — ABNORMAL HIGH (ref 70–99)
Glucose-Capillary: 156 mg/dL — ABNORMAL HIGH (ref 70–99)
Glucose-Capillary: 161 mg/dL — ABNORMAL HIGH (ref 70–99)
Glucose-Capillary: 163 mg/dL — ABNORMAL HIGH (ref 70–99)
Glucose-Capillary: 182 mg/dL — ABNORMAL HIGH (ref 70–99)
Glucose-Capillary: 190 mg/dL — ABNORMAL HIGH (ref 70–99)
Glucose-Capillary: 196 mg/dL — ABNORMAL HIGH (ref 70–99)
Glucose-Capillary: 98 mg/dL (ref 70–99)

## 2010-09-15 LAB — BASIC METABOLIC PANEL
BUN: 6 mg/dL (ref 6–23)
BUN: 9 mg/dL (ref 6–23)
BUN: 23 mg/dL (ref 6–23)
BUN: 30 mg/dL — ABNORMAL HIGH (ref 6–23)
BUN: 36 mg/dL — ABNORMAL HIGH (ref 6–23)
BUN: 40 mg/dL — ABNORMAL HIGH (ref 6–23)
CO2: 27 mEq/L (ref 19–32)
CO2: 27 mEq/L (ref 19–32)
CO2: 27 mEq/L (ref 19–32)
CO2: 29 mEq/L (ref 19–32)
CO2: 27 mEq/L (ref 19–32)
Calcium: 7.5 mg/dL — ABNORMAL LOW (ref 8.4–10.5)
Calcium: 7.6 mg/dL — ABNORMAL LOW (ref 8.4–10.5)
Calcium: 7.9 mg/dL — ABNORMAL LOW (ref 8.4–10.5)
Calcium: 7.8 mg/dL — ABNORMAL LOW (ref 8.4–10.5)
Calcium: 8.1 mg/dL — ABNORMAL LOW (ref 8.4–10.5)
Calcium: 8.5 mg/dL (ref 8.4–10.5)
Chloride: 106 mEq/L (ref 96–112)
Chloride: 106 mEq/L (ref 96–112)
Chloride: 104 mEq/L (ref 96–112)
Chloride: 107 mEq/L (ref 96–112)
Chloride: 109 mEq/L (ref 96–112)
Chloride: 109 mEq/L (ref 96–112)
Creatinine, Ser: 0.89 mg/dL (ref 0.4–1.2)
Creatinine, Ser: 0.97 mg/dL (ref 0.4–1.2)
Creatinine, Ser: 0.99 mg/dL (ref 0.4–1.2)
Creatinine, Ser: 1.07 mg/dL (ref 0.4–1.2)
Creatinine, Ser: 1.32 mg/dL — ABNORMAL HIGH (ref 0.4–1.2)
GFR calc Af Amer: >60 mL/min (ref >60)
GFR calc non Af Amer: 38 mL/min — ABNORMAL LOW (ref >60)
GFR calc non Af Amer: 42 mL/min — ABNORMAL LOW (ref >60)
GFR calc non Af Amer: 46 mL/min — ABNORMAL LOW (ref >60)
GFR calc non Af Amer: 47 mL/min — ABNORMAL LOW (ref >60)
GFR calc non Af Amer: 48 mL/min — ABNORMAL LOW (ref >60)
GFR calc non Af Amer: 48 mL/min — ABNORMAL LOW (ref >60)
Glucose, Bld: 102 mg/dL — ABNORMAL HIGH (ref 70–99)
Glucose, Bld: 108 mg/dL — ABNORMAL HIGH (ref 70–99)
Glucose, Bld: 95 mg/dL (ref 70–99)
Glucose, Bld: 98 mg/dL (ref 70–99)
Glucose, Bld: 116 mg/dL — ABNORMAL HIGH (ref 70–99)
Glucose, Bld: 137 mg/dL — ABNORMAL HIGH (ref 70–99)
Glucose, Bld: 145 mg/dL — ABNORMAL HIGH (ref 70–99)
Glucose, Bld: 207 mg/dL — ABNORMAL HIGH (ref 70–99)
Potassium: 2.4 mEq/L — CL (ref 3.5–5.1)
Potassium: 3.8 mEq/L (ref 3.5–5.1)
Potassium: 3.9 mEq/L (ref 3.5–5.1)
Potassium: 3.9 mEq/L (ref 3.5–5.1)
Potassium: 4.6 mEq/L (ref 3.5–5.1)
Sodium: 138 mEq/L (ref 135–145)
Sodium: 141 mEq/L (ref 135–145)
Sodium: 141 mEq/L (ref 135–145)
Sodium: 140 mEq/L (ref 135–145)
Sodium: 140 mEq/L (ref 135–145)
Sodium: 146 mEq/L — ABNORMAL HIGH (ref 135–145)
Sodium: 146 mEq/L — ABNORMAL HIGH (ref 135–145)

## 2010-09-15 LAB — DIFFERENTIAL
Basophils Absolute: 0 10*3/uL (ref 0.0–0.1)
Basophils Absolute: 0.1 10*3/uL (ref 0.0–0.1)
Basophils Absolute: 0.3 10*3/uL — ABNORMAL HIGH (ref 0.0–0.1)
Lymphocytes Relative: 13 % (ref 12–46)
Lymphs Abs: 2.4 10*3/uL (ref 0.7–4.0)
Lymphs Abs: 3.3 10*3/uL (ref 0.7–4.0)
Monocytes Absolute: 3 10*3/uL — ABNORMAL HIGH (ref 0.1–1.0)
Monocytes Absolute: 3.1 10*3/uL — ABNORMAL HIGH (ref 0.1–1.0)
Monocytes Relative: 13 % — ABNORMAL HIGH (ref 3–12)
Monocytes Relative: 14 % — ABNORMAL HIGH (ref 3–12)
Neutro Abs: 16.5 10*3/uL — ABNORMAL HIGH (ref 1.7–7.7)
Neutro Abs: 17.8 10*3/uL — ABNORMAL HIGH (ref 1.7–7.7)
Neutrophils Relative %: 74 % (ref 43–77)

## 2010-09-15 LAB — POCT CARDIAC MARKERS
CKMB, poc: 1.3 ng/mL (ref 1.0–8.0)
Myoglobin, poc: 106 ng/mL (ref 12–200)
Troponin i, poc: <0.05 ng/mL (ref 0.00–0.09)
Troponin i, poc: <0.05 ng/mL (ref 0.00–0.09)

## 2010-09-15 LAB — CROSSMATCH

## 2010-09-15 LAB — COMPREHENSIVE METABOLIC PANEL
ALT: 15 U/L (ref 0–35)
Alkaline Phosphatase: 54 U/L (ref 39–117)
CO2: 27 mEq/L (ref 19–32)
Calcium: 8 mg/dL — ABNORMAL LOW (ref 8.4–10.5)
Chloride: 110 mEq/L (ref 96–112)
GFR calc non Af Amer: 51 mL/min — ABNORMAL LOW (ref >60)
Glucose, Bld: 150 mg/dL — ABNORMAL HIGH (ref 70–99)
Potassium: 4 mEq/L (ref 3.5–5.1)
Sodium: 141 mEq/L (ref 135–145)
Total Bilirubin: 0.3 mg/dL (ref 0.3–1.2)

## 2010-09-15 LAB — CULTURE, BLOOD (ROUTINE X 2)

## 2010-09-15 LAB — HEMOCCULT GUIAC POC 1CARD (OFFICE): Fecal Occult Bld: POSITIVE

## 2010-09-15 LAB — T4, FREE: Free T4: 1.65 ng/dL (ref 0.80–1.80)

## 2010-09-15 LAB — HEMOGLOBIN A1C
Hgb A1c MFr Bld: 6.4 % — ABNORMAL HIGH (ref <5.7)
Hgb A1c MFr Bld: 6.4 % — ABNORMAL HIGH (ref <5.7)
Mean Plasma Glucose: 137 mg/dL — ABNORMAL HIGH (ref <117)

## 2010-09-15 LAB — PHOSPHORUS: Phosphorus: 2.5 mg/dL (ref 2.3–4.6)

## 2010-09-15 LAB — VANCOMYCIN, TROUGH
Vancomycin Tr: 24.8 ug/mL — ABNORMAL HIGH (ref 10.0–20.0)
Vancomycin Tr: 5.4 ug/mL — ABNORMAL LOW (ref 10.0–20.0)

## 2010-09-15 LAB — BRAIN NATRIURETIC PEPTIDE: Pro B Natriuretic peptide (BNP): 330 pg/mL — ABNORMAL HIGH (ref 0.0–100.0)

## 2010-09-16 LAB — CBC
HCT: 33.6 % — ABNORMAL LOW (ref 36.0–46.0)
HCT: 44 % (ref 36.0–46.0)
Hemoglobin: 11.2 g/dL — ABNORMAL LOW (ref 12.0–15.0)
Hemoglobin: 12.4 g/dL (ref 12.0–15.0)
Hemoglobin: 15.3 g/dL — ABNORMAL HIGH (ref 12.0–15.0)
MCH: 29.9 pg (ref 26.0–34.0)
MCH: 30.5 pg (ref 26.0–34.0)
MCH: 30.8 pg (ref 26.0–34.0)
MCV: 87.9 fL (ref 78.0–100.0)
MCV: 89.4 fL (ref 78.0–100.0)
MCV: 89.6 fL (ref 78.0–100.0)
Platelets: 331 10*3/uL (ref 150–400)
Platelets: 346 10*3/uL (ref 150–400)
Platelets: 365 10*3/uL (ref 150–400)
RBC: 3.75 MIL/uL — ABNORMAL LOW (ref 3.87–5.11)
RBC: 4.06 MIL/uL (ref 3.87–5.11)
RBC: 4.23 MIL/uL (ref 3.87–5.11)
RBC: 4.97 MIL/uL (ref 3.87–5.11)
RDW: 12.5 % (ref 11.5–15.5)
WBC: 18.2 10*3/uL — ABNORMAL HIGH (ref 4.0–10.5)
WBC: 21.2 10*3/uL — ABNORMAL HIGH (ref 4.0–10.5)
WBC: 22 10*3/uL — ABNORMAL HIGH (ref 4.0–10.5)
WBC: 29.5 10*3/uL — ABNORMAL HIGH (ref 4.0–10.5)
WBC: 32.4 10*3/uL — ABNORMAL HIGH (ref 4.0–10.5)

## 2010-09-16 LAB — DIFFERENTIAL
Basophils Absolute: 0 10*3/uL (ref 0.0–0.1)
Basophils Relative: 0 % (ref 0–1)
Eosinophils Absolute: 0 10*3/uL (ref 0.0–0.7)
Eosinophils Relative: 0 % (ref 0–5)
Eosinophils Relative: 0 % (ref 0–5)
Eosinophils Relative: 0 % (ref 0–5)
Eosinophils Relative: 3 % (ref 0–5)
Lymphocytes Relative: 3 % — ABNORMAL LOW (ref 12–46)
Lymphocytes Relative: 4 % — ABNORMAL LOW (ref 12–46)
Lymphocytes Relative: 7 % — ABNORMAL LOW (ref 12–46)
Lymphs Abs: 0.9 10*3/uL (ref 0.7–4.0)
Monocytes Absolute: 1.9 10*3/uL — ABNORMAL HIGH (ref 0.1–1.0)
Monocytes Relative: 6 % (ref 3–12)
Monocytes Relative: 7 % (ref 3–12)
Neutro Abs: 17.2 10*3/uL — ABNORMAL HIGH (ref 1.7–7.7)
Neutrophils Relative %: 81 % — ABNORMAL HIGH (ref 43–77)
Neutrophils Relative %: 90 % — ABNORMAL HIGH (ref 43–77)
Neutrophils Relative %: 94 % — ABNORMAL HIGH (ref 43–77)

## 2010-09-16 LAB — BASIC METABOLIC PANEL
BUN: 17 mg/dL (ref 6–23)
BUN: 24 mg/dL — ABNORMAL HIGH (ref 6–23)
BUN: 46 mg/dL — ABNORMAL HIGH (ref 6–23)
CO2: 24 mEq/L (ref 19–32)
Calcium: 8.5 mg/dL (ref 8.4–10.5)
Calcium: 8.8 mg/dL (ref 8.4–10.5)
Calcium: 8.9 mg/dL (ref 8.4–10.5)
Chloride: 101 mEq/L (ref 96–112)
Chloride: 103 mEq/L (ref 96–112)
Chloride: 106 mEq/L (ref 96–112)
Chloride: 109 mEq/L (ref 96–112)
Creatinine, Ser: 1.11 mg/dL (ref 0.4–1.2)
Creatinine, Ser: 1.31 mg/dL — ABNORMAL HIGH (ref 0.4–1.2)
Creatinine, Ser: 1.43 mg/dL — ABNORMAL HIGH (ref 0.4–1.2)
Creatinine, Ser: 1.46 mg/dL — ABNORMAL HIGH (ref 0.4–1.2)
GFR calc Af Amer: 40 mL/min — ABNORMAL LOW (ref >60)
GFR calc Af Amer: 41 mL/min — ABNORMAL LOW (ref >60)
GFR calc Af Amer: 46 mL/min — ABNORMAL LOW (ref >60)
GFR calc Af Amer: 50 mL/min — ABNORMAL LOW (ref >60)
GFR calc non Af Amer: 33 mL/min — ABNORMAL LOW (ref >60)
GFR calc non Af Amer: 46 mL/min — ABNORMAL LOW (ref >60)
Glucose, Bld: 172 mg/dL — ABNORMAL HIGH (ref 70–99)
Potassium: 3.1 mEq/L — ABNORMAL LOW (ref 3.5–5.1)
Potassium: 3.3 mEq/L — ABNORMAL LOW (ref 3.5–5.1)
Sodium: 138 mEq/L (ref 135–145)
Sodium: 138 mEq/L (ref 135–145)

## 2010-09-16 LAB — POCT CARDIAC MARKERS
Myoglobin, poc: 114 ng/mL (ref 12–200)
Troponin i, poc: <0.05 ng/mL (ref 0.00–0.09)

## 2010-09-16 LAB — GLUCOSE, CAPILLARY
Glucose-Capillary: 160 mg/dL — ABNORMAL HIGH (ref 70–99)
Glucose-Capillary: 234 mg/dL — ABNORMAL HIGH (ref 70–99)

## 2010-09-16 LAB — BRAIN NATRIURETIC PEPTIDE
Pro B Natriuretic peptide (BNP): 205 pg/mL — ABNORMAL HIGH (ref 0.0–100.0)
Pro B Natriuretic peptide (BNP): 436 pg/mL — ABNORMAL HIGH (ref 0.0–100.0)

## 2010-09-16 LAB — CARDIAC PANEL(CRET KIN+CKTOT+MB+TROPI)
CK, MB: 10 ng/mL (ref 0.3–4.0)
Relative Index: 8.5 — ABNORMAL HIGH (ref 0.0–2.5)
Total CK: 157 U/L (ref 7–177)
Total CK: 359 U/L — ABNORMAL HIGH (ref 7–177)
Troponin I: 0.16 ng/mL — ABNORMAL HIGH (ref 0.00–0.06)

## 2010-09-16 LAB — CK TOTAL AND CKMB (NOT AT ARMC): Total CK: 135 U/L (ref 7–177)

## 2010-09-16 LAB — TSH: TSH: 0.644 u[IU]/mL (ref 0.350–4.500)

## 2010-09-16 LAB — CULTURE, BLOOD (ROUTINE X 2)
Culture: NO GROWTH
Culture: NO GROWTH

## 2010-09-16 LAB — HEPATIC FUNCTION PANEL
ALT: 24 U/L (ref 0–35)
AST: 27 U/L (ref 0–37)
Alkaline Phosphatase: 57 U/L (ref 39–117)
Bilirubin, Direct: 0.2 mg/dL (ref 0.0–0.3)
Indirect Bilirubin: 0.6 mg/dL (ref 0.3–0.9)
Total Bilirubin: 0.8 mg/dL (ref 0.3–1.2)

## 2010-10-14 ENCOUNTER — Encounter (HOSPITAL_BASED_OUTPATIENT_CLINIC_OR_DEPARTMENT_OTHER): Payer: Medicare Other | Admitting: Oncology

## 2010-10-14 ENCOUNTER — Other Ambulatory Visit: Payer: Self-pay | Admitting: Oncology

## 2010-10-14 DIAGNOSIS — D473 Essential (hemorrhagic) thrombocythemia: Secondary | ICD-10-CM

## 2010-10-14 DIAGNOSIS — D649 Anemia, unspecified: Secondary | ICD-10-CM

## 2010-10-14 DIAGNOSIS — D72829 Elevated white blood cell count, unspecified: Secondary | ICD-10-CM

## 2010-10-14 LAB — CBC WITH DIFFERENTIAL/PLATELET
Basophils Absolute: 0 10*3/uL (ref 0.0–0.1)
EOS%: 3.7 % (ref 0.0–7.0)
HGB: 12.1 g/dL (ref 11.6–15.9)
MCH: 30.2 pg (ref 25.1–34.0)
MONO#: 1.9 10*3/uL — ABNORMAL HIGH (ref 0.1–0.9)
NEUT#: 10 10*3/uL — ABNORMAL HIGH (ref 1.5–6.5)
RDW: 14.8 % — ABNORMAL HIGH (ref 11.2–14.5)
WBC: 14.9 10*3/uL — ABNORMAL HIGH (ref 3.9–10.3)
lymph#: 2.5 10*3/uL (ref 0.9–3.3)

## 2011-01-12 ENCOUNTER — Other Ambulatory Visit: Payer: Self-pay | Admitting: Dermatology

## 2011-03-02 ENCOUNTER — Emergency Department (HOSPITAL_COMMUNITY)
Admission: EM | Admit: 2011-03-02 | Discharge: 2011-03-02 | Disposition: A | Discharge status: Home / Self Care | Payer: Medicare Other | Attending: Emergency Medicine | Admitting: Emergency Medicine

## 2011-03-02 DIAGNOSIS — I4891 Unspecified atrial fibrillation: Secondary | ICD-10-CM | POA: Insufficient documentation

## 2011-03-02 DIAGNOSIS — S0990XA Unspecified injury of head, initial encounter: Secondary | ICD-10-CM | POA: Insufficient documentation

## 2011-03-02 DIAGNOSIS — S0100XA Unspecified open wound of scalp, initial encounter: Secondary | ICD-10-CM | POA: Insufficient documentation

## 2011-03-02 DIAGNOSIS — J4489 Other specified chronic obstructive pulmonary disease: Secondary | ICD-10-CM | POA: Insufficient documentation

## 2011-03-02 DIAGNOSIS — W010XXA Fall on same level from slipping, tripping and stumbling without subsequent striking against object, initial encounter: Secondary | ICD-10-CM | POA: Insufficient documentation

## 2011-03-02 DIAGNOSIS — J449 Chronic obstructive pulmonary disease, unspecified: Secondary | ICD-10-CM | POA: Insufficient documentation

## 2011-03-02 DIAGNOSIS — Z79899 Other long term (current) drug therapy: Secondary | ICD-10-CM | POA: Insufficient documentation

## 2011-03-02 DIAGNOSIS — I10 Essential (primary) hypertension: Secondary | ICD-10-CM | POA: Insufficient documentation

## 2011-03-09 ENCOUNTER — Encounter (HOSPITAL_BASED_OUTPATIENT_CLINIC_OR_DEPARTMENT_OTHER): Payer: Medicare Other | Admitting: Oncology

## 2011-03-09 ENCOUNTER — Other Ambulatory Visit: Payer: Self-pay | Admitting: Oncology

## 2011-03-09 DIAGNOSIS — D473 Essential (hemorrhagic) thrombocythemia: Secondary | ICD-10-CM

## 2011-03-09 DIAGNOSIS — D72829 Elevated white blood cell count, unspecified: Secondary | ICD-10-CM

## 2011-03-09 LAB — CBC WITH DIFFERENTIAL/PLATELET
Basophils Absolute: 0 10*3/uL (ref 0.0–0.1)
Eosinophils Absolute: 0.8 10*3/uL — ABNORMAL HIGH (ref 0.0–0.5)
HCT: 38.3 % (ref 34.8–46.6)
HGB: 13.1 g/dL (ref 11.6–15.9)
MCV: 88.8 fL (ref 79.5–101.0)
MONO%: 10.2 % (ref 0.0–14.0)
NEUT#: 8.4 10*3/uL — ABNORMAL HIGH (ref 1.5–6.5)
RDW: 12.9 % (ref 11.2–14.5)
lymph#: 2.9 10*3/uL (ref 0.9–3.3)

## 2011-05-12 IMAGING — CR DG CHEST 2V
2 series · 2 of 2 positions shown · non-contrast
Comparison: 03/15/2010

CLINICAL DATA: Follow-up pneumonia

CHEST - 2 VIEW

[w chest pa]
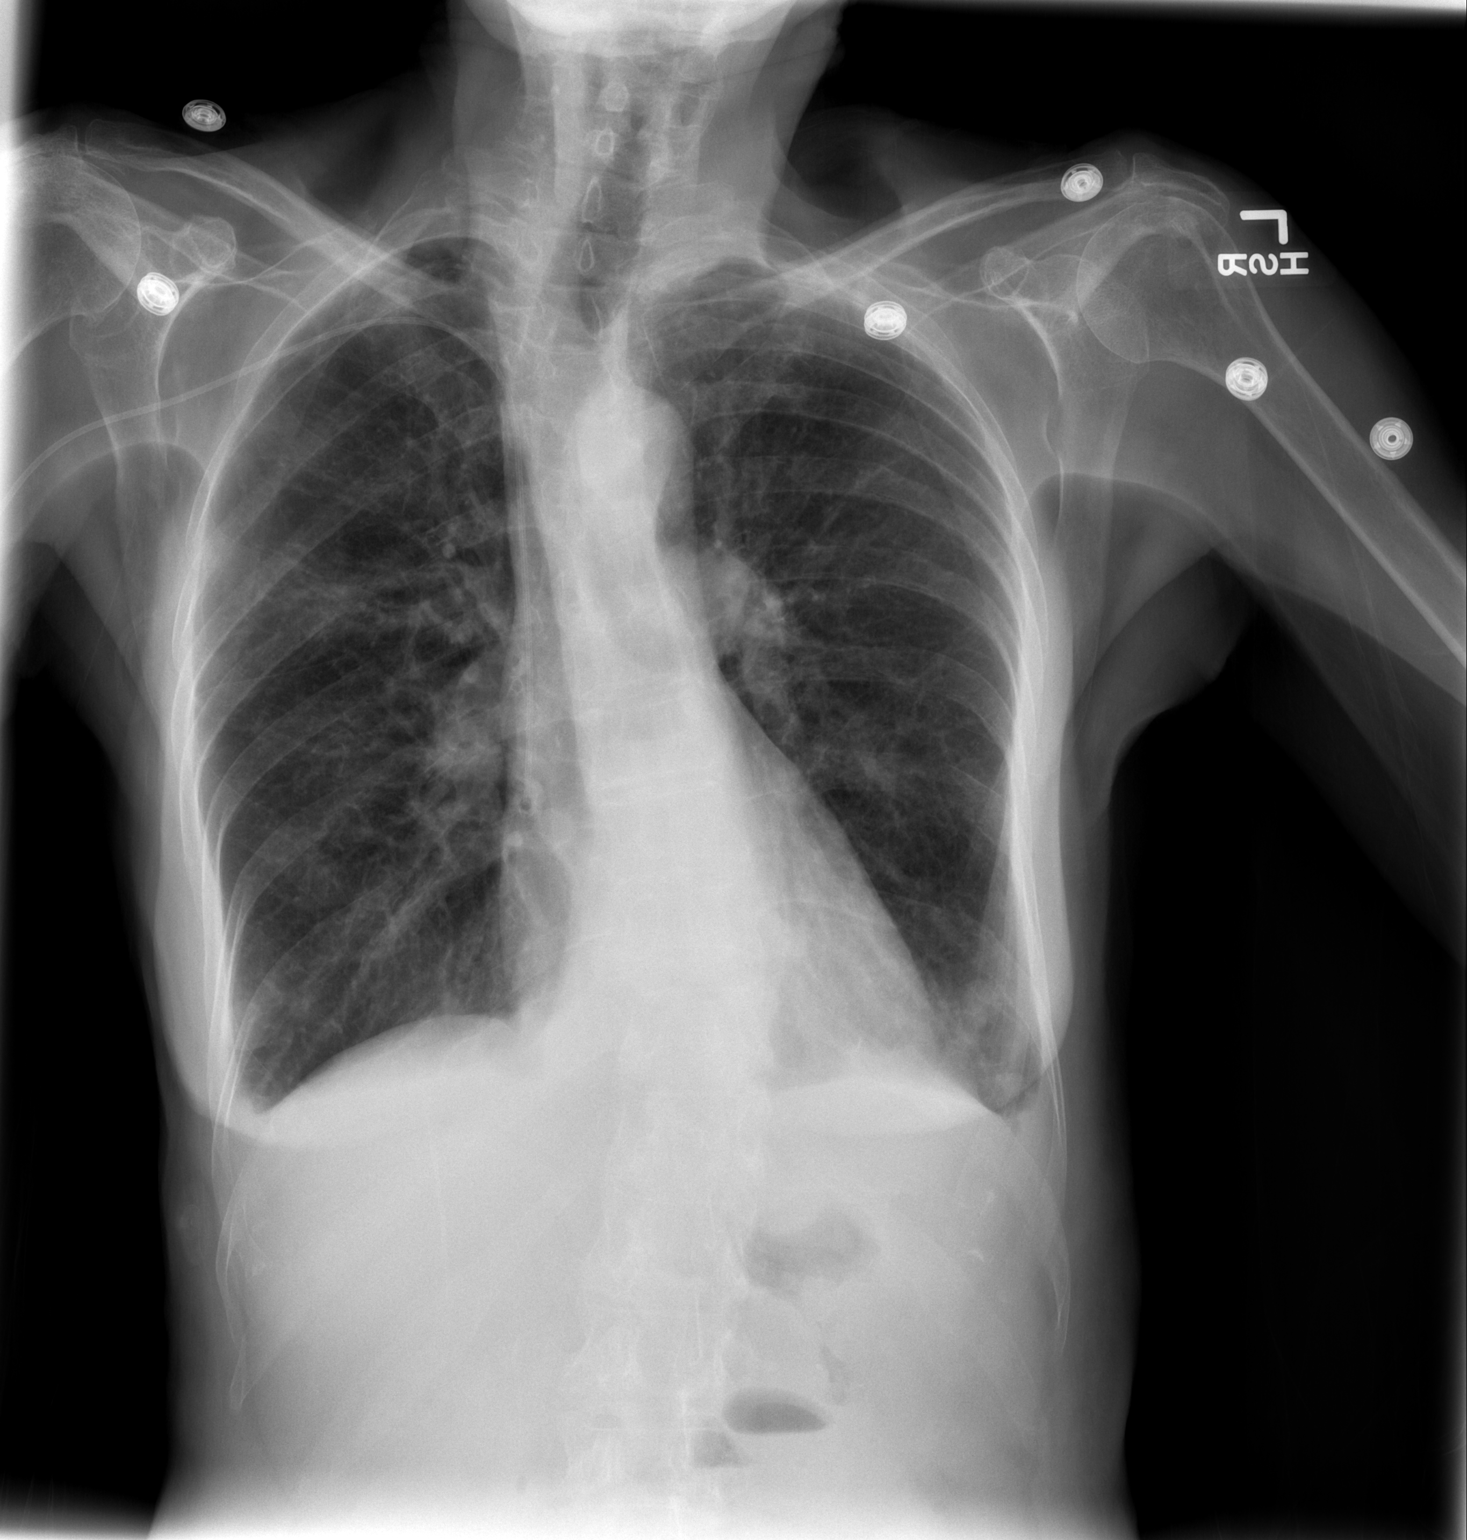

[w chest lat]
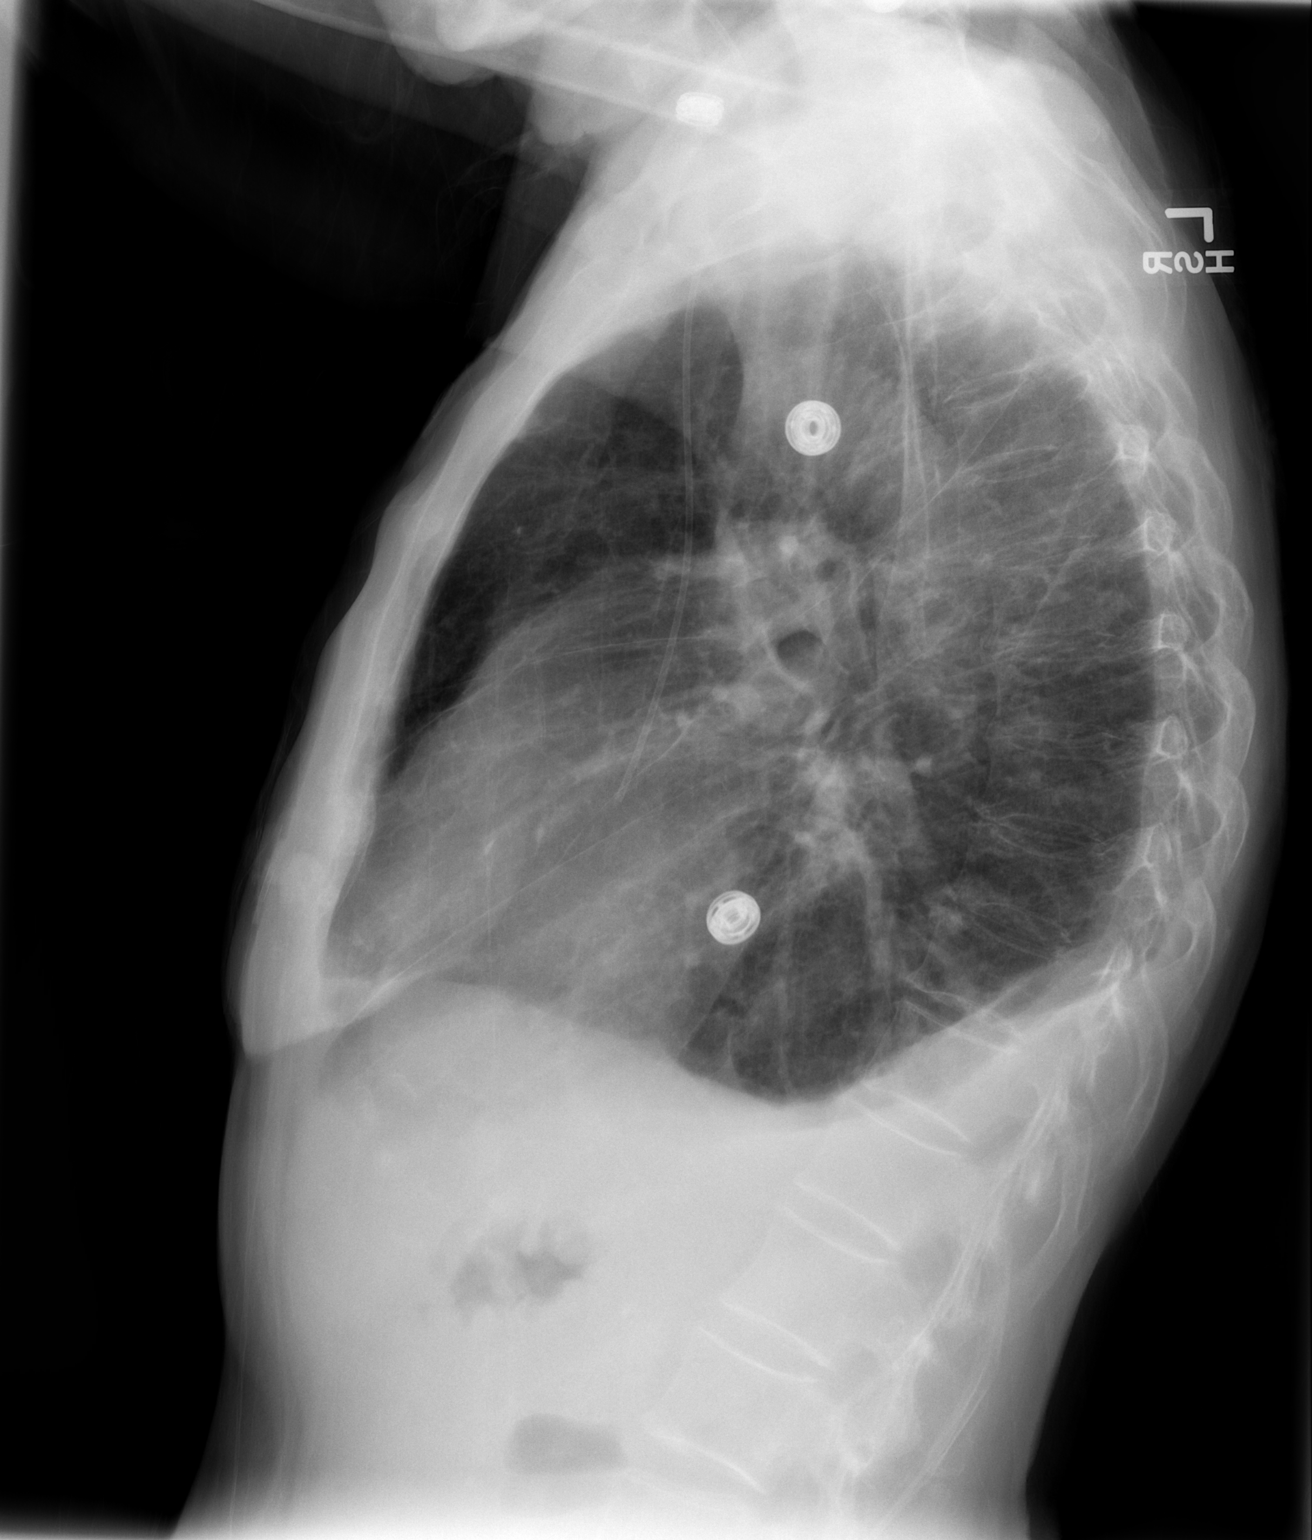

[2 of 2 positions shown; findings below may reference images not displayed]

FINDINGS: There is significant improved aeration at the right lung
base with minimal persistent increased opacity in the right upper
lobe.  Increased opacity seen in the left lung base, likely related
to atelectasis.  Small bilateral pleural effusions are seen.  There
is a background of hyperexpansion.  Right upper extremity PICC
remains in place with the tip at the cavoatrial junction.  The
heart is normal in size and contour.  The upper abdomen and osseous
structures are unchanged.
IMPRESSION: 1.  Persistent but improved airspace disease as detailed above with
small bilateral pleural effusions.
2.  Emphysema.

## 2011-05-14 ENCOUNTER — Emergency Department (HOSPITAL_COMMUNITY)
Admission: EM | Admit: 2011-05-14 | Discharge: 2011-05-14 | Disposition: A | Discharge status: Home / Self Care | Payer: Medicare Other | Attending: Emergency Medicine | Admitting: Emergency Medicine

## 2011-05-14 ENCOUNTER — Encounter: Payer: Self-pay | Admitting: *Deleted

## 2011-05-14 DIAGNOSIS — J4489 Other specified chronic obstructive pulmonary disease: Secondary | ICD-10-CM | POA: Insufficient documentation

## 2011-05-14 DIAGNOSIS — I252 Old myocardial infarction: Secondary | ICD-10-CM | POA: Insufficient documentation

## 2011-05-14 DIAGNOSIS — S99929A Unspecified injury of unspecified foot, initial encounter: Secondary | ICD-10-CM

## 2011-05-14 DIAGNOSIS — I10 Essential (primary) hypertension: Secondary | ICD-10-CM | POA: Insufficient documentation

## 2011-05-14 DIAGNOSIS — J449 Chronic obstructive pulmonary disease, unspecified: Secondary | ICD-10-CM | POA: Insufficient documentation

## 2011-05-14 DIAGNOSIS — S8990XA Unspecified injury of unspecified lower leg, initial encounter: Secondary | ICD-10-CM | POA: Insufficient documentation

## 2011-05-14 DIAGNOSIS — W278XXA Contact with other nonpowered hand tool, initial encounter: Secondary | ICD-10-CM | POA: Insufficient documentation

## 2011-05-14 HISTORY — DX: Chronic obstructive pulmonary disease, unspecified: J44.9

## 2011-05-14 HISTORY — DX: Edema, unspecified: R60.9

## 2011-05-14 HISTORY — DX: Essential (primary) hypertension: I10

## 2011-05-14 HISTORY — DX: Anemia, unspecified: D64.9

## 2011-05-14 HISTORY — DX: Acute myocardial infarction, unspecified: I21.9

## 2011-05-14 HISTORY — DX: Anxiety disorder, unspecified: F41.9

## 2011-05-14 HISTORY — DX: Elevated white blood cell count, unspecified: D72.829

## 2011-05-14 HISTORY — DX: Heart failure, unspecified: I50.9

## 2011-05-14 NOTE — ED Notes (Signed)
ZOX:[id number]<AV> Expected date:05/14/11<BR> Expected time: 3:40 PM<BR> Means of arrival:Ambulance<BR> Comments:<BR> EMS 40 GC - toe injury

## 2011-05-14 NOTE — ED Notes (Signed)
[facility] called for transport back to [facility], will monitor.

## 2011-05-14 NOTE — ED Provider Notes (Signed)
History     CSN: [id number] Arrival date & time: 05/14/2011  3:56 PM   First MD Initiated Contact with Patient 05/14/11 1622      Chief Complaint  Patient presents with  . Toe Injury    Right great toe    (Consider location/radiation/quality/duration/timing/severity/associated sxs/prior treatment) HPI Comments: Cutting toenails and accidentally cut her toe  Patient is a 77 y.o. female presenting with toe pain.  Toe Pain This is a new problem. The current episode started less than 1 hour ago. The problem occurs constantly. The problem has not changed since onset.The symptoms are aggravated by nothing. The symptoms are relieved by nothing. She has tried nothing for the symptoms.    Past Medical History  Diagnosis Date  . COPD (chronic obstructive pulmonary disease)   . CHF (congestive heart failure)   . Edema   . Myocardial infarction   . Anxiety   . Hypertension   . Anemia   . Leukocytosis     No past surgical history on file.  No family history on file.  History  Substance Use Topics  . Smoking status: Not on file  . Smokeless tobacco: Not on file  . Alcohol Use:     OB History    Grav Para Term Preterm Abortions TAB SAB Ect Mult Living                  Review of Systems  All other systems reviewed and are negative.    Allergies  Review of patient's allergies indicates no known allergies.  Home Medications  No current outpatient prescriptions on file.  BP 150/70  Pulse 80  Resp 18  Physical Exam  Nursing note and vitals reviewed. Constitutional: She is oriented to person, place, and time. She appears well-developed and well-nourished. No distress.  HENT:  Head: Normocephalic and atraumatic.  Eyes: EOM are normal. Pupils are equal, round, and reactive to light.  Musculoskeletal:       Feet:       Superficial laceration to the right great toe that is hemostatic without other injury  Neurological: She is alert and oriented to person, place,  and time.  Skin: Skin is warm and dry.    ED Course  Procedures (including critical care time)  Labs Reviewed - No data to display No results found.   No diagnosis found.    MDM   Pt cut her toe while trimming her nail and it started to bleed.  Pt denies any other injury.  Wound is superficial and does not need repair.        Gwyneth Sprout, MD 05/14/11 [phone]

## 2011-05-14 NOTE — ED Notes (Signed)
Per EMS and pt, pt was trimming toenails and accidentally cut skin instead of the toenail on the side of the right great toe, area wrapped and bleeding controlled.

## 2011-06-05 ENCOUNTER — Other Ambulatory Visit: Payer: Self-pay | Admitting: Internal Medicine

## 2011-06-05 DIAGNOSIS — Z1231 Encounter for screening mammogram for malignant neoplasm of breast: Secondary | ICD-10-CM

## 2011-06-05 DIAGNOSIS — Z780 Asymptomatic menopausal state: Secondary | ICD-10-CM

## 2011-06-30 ENCOUNTER — Telehealth: Payer: Self-pay | Admitting: Oncology

## 2011-06-30 NOTE — Telephone Encounter (Signed)
l/m with appt info for 1/29 and 2/6.  also info for ct sent     aom

## 2011-07-11 ENCOUNTER — Other Ambulatory Visit: Payer: 59

## 2011-07-11 ENCOUNTER — Ambulatory Visit: Payer: 59

## 2011-07-20 ENCOUNTER — Other Ambulatory Visit: Payer: Self-pay | Admitting: Internal Medicine

## 2011-07-20 ENCOUNTER — Ambulatory Visit
Admission: RE | Admit: 2011-07-20 | Discharge: 2011-07-20 | Disposition: A | Discharge status: Home / Self Care | Payer: Medicare Other | Source: Ambulatory Visit | Attending: Internal Medicine | Admitting: Internal Medicine

## 2011-07-20 DIAGNOSIS — M549 Dorsalgia, unspecified: Secondary | ICD-10-CM

## 2011-07-20 DIAGNOSIS — M545 Low back pain: Secondary | ICD-10-CM

## 2011-07-20 DIAGNOSIS — M810 Age-related osteoporosis without current pathological fracture: Secondary | ICD-10-CM | POA: Diagnosis not present

## 2011-07-20 DIAGNOSIS — M4120 Other idiopathic scoliosis, site unspecified: Secondary | ICD-10-CM | POA: Diagnosis not present

## 2011-07-20 DIAGNOSIS — W19XXXA Unspecified fall, initial encounter: Secondary | ICD-10-CM | POA: Diagnosis not present

## 2011-07-20 DIAGNOSIS — M4310 Spondylolisthesis, site unspecified: Secondary | ICD-10-CM | POA: Diagnosis not present

## 2011-07-21 DIAGNOSIS — M546 Pain in thoracic spine: Secondary | ICD-10-CM | POA: Diagnosis not present

## 2011-07-21 DIAGNOSIS — M545 Low back pain, unspecified: Secondary | ICD-10-CM | POA: Diagnosis not present

## 2011-07-31 ENCOUNTER — Encounter: Payer: Self-pay | Admitting: *Deleted

## 2011-07-31 DIAGNOSIS — M810 Age-related osteoporosis without current pathological fracture: Secondary | ICD-10-CM | POA: Diagnosis not present

## 2011-07-31 DIAGNOSIS — W19XXXA Unspecified fall, initial encounter: Secondary | ICD-10-CM | POA: Diagnosis not present

## 2011-08-06 DIAGNOSIS — G309 Alzheimer's disease, unspecified: Secondary | ICD-10-CM | POA: Insufficient documentation

## 2011-08-06 DIAGNOSIS — F0280 Dementia in other diseases classified elsewhere without behavioral disturbance: Secondary | ICD-10-CM | POA: Insufficient documentation

## 2011-08-14 ENCOUNTER — Ambulatory Visit
Admission: RE | Admit: 2011-08-14 | Discharge: 2011-08-14 | Disposition: A | Discharge status: Home / Self Care | Payer: 59 | Source: Ambulatory Visit | Attending: Internal Medicine | Admitting: Internal Medicine

## 2011-08-14 DIAGNOSIS — Z780 Asymptomatic menopausal state: Secondary | ICD-10-CM | POA: Diagnosis not present

## 2011-08-14 DIAGNOSIS — Z13820 Encounter for screening for osteoporosis: Secondary | ICD-10-CM | POA: Diagnosis not present

## 2011-08-14 DIAGNOSIS — Z1231 Encounter for screening mammogram for malignant neoplasm of breast: Secondary | ICD-10-CM

## 2011-08-14 LAB — HM DEXA SCAN

## 2011-08-15 ENCOUNTER — Other Ambulatory Visit: Payer: Medicare Other | Admitting: Lab

## 2011-08-15 ENCOUNTER — Telehealth: Payer: Self-pay | Admitting: Oncology

## 2011-08-15 ENCOUNTER — Ambulatory Visit (HOSPITAL_BASED_OUTPATIENT_CLINIC_OR_DEPARTMENT_OTHER): Payer: Medicare Other | Admitting: Oncology

## 2011-08-15 VITALS — BP 175/89 | HR 89 | Temp 96.9°F | Ht 61.0 in | Wt 92.5 lb

## 2011-08-15 DIAGNOSIS — D72829 Elevated white blood cell count, unspecified: Secondary | ICD-10-CM | POA: Diagnosis not present

## 2011-08-15 LAB — CBC WITH DIFFERENTIAL/PLATELET
BASO%: 0.3 % (ref 0.0–2.0)
EOS%: 8.6 % — ABNORMAL HIGH (ref 0.0–7.0)
HGB: 13.9 g/dL (ref 11.6–15.9)
MCH: 30.9 pg (ref 25.1–34.0)
MCHC: 34.5 g/dL (ref 31.5–36.0)
RDW: 13.1 % (ref 11.2–14.5)
WBC: 14.6 10*3/uL — ABNORMAL HIGH (ref 3.9–10.3)
lymph#: 2.9 10*3/uL (ref 0.9–3.3)

## 2011-08-15 NOTE — Telephone Encounter (Signed)
appts made and printed for 01/2013   aom

## 2011-08-15 NOTE — Progress Notes (Signed)
Hematology and Oncology Follow Up Visit  Bianca Khan [id number] April 18, 1915 78 y.o. 08/15/2011 10:02 AM GREEN, Bianca Ishihara, MD, MDGreen, Bianca Ishihara, MD   PRINCIPAL DIAGNOSIS:  This is a 78 year old female with leukocytosis, most likely reactive versus early sign of myeloproliferative disorder.  INTERIM HISTORY:  Bianca Khan presents today for an office followup visit.  Her son accompanies her.  Overall, Ms. Alexie reports she has been doing relatively well.  She recently had a fall and had to have some stitches placed in the back of her head, but other than that, has been doing quite well.  She has not reported any recent hospitalizations, any new medications,she continues on Pradaxa. She remains at [facility] and has continued to do rehab quite nicely.  Overall performance status and activity level continue to improve slowly.  She does use a rolling walker to get around with and ambulate.  She otherwise is not reporting any headaches, visual changes, any dysphagia, odynophagia, any nausea, vomiting, diarrhea, constipation, chest pain, shortness of breath, productive cough, abdominal pain, abdominal swelling, lower extremity paresthesias, bowel or bladder  incontinence.  She has not reported any fevers, chills, night sweats or any obvious bleeding such as melena, hematochezia or hematuria.  REVIEW OF SYSTEMS:  She denies changes in her vision, hearing, adenopathy, fever, chills, nausea, vomiting, diarrhea, constipation, chest pain, shortness of breath, passing blood, blacking out, passing out, changes in skin, joints, neurologic or psychiatric except as noted.  Medications:  Current outpatient prescriptions:acetaminophen (TYLENOL) 650 MG CR tablet, Take 650 mg by mouth as needed., Disp: , Rfl: ;  albuterol (PROVENTIL) (2.5 MG/3ML) 0.083% nebulizer solution, Take 2.5 mg by nebulization every 6 (six) hours as needed., Disp: , Rfl: ;  ALPRAZolam (XANAX) 0.5 MG tablet, Take 0.5 mg by mouth at  bedtime as needed., Disp: , Rfl: ;  Calcium-Vitamin D-Vitamin K (VIACTIV PO), Take by mouth daily., Disp: , Rfl:  diltiazem (CARDIZEM CD) 240 MG 24 hr capsule, Take 240 mg by mouth daily., Disp: , Rfl: ;  doxycycline (VIBRAMYCIN) 100 MG capsule, Take 100 mg by mouth 2 (two) times daily., Disp: , Rfl: ;  ergocalciferol (VITAMIN D2) 50000 UNITS capsule, Take 50,000 Units by mouth once a week., Disp: , Rfl: ;  ferrous sulfate 325 (65 FE) MG tablet, Take 325 mg by mouth daily with breakfast., Disp: , Rfl:  HYDROcodone-acetaminophen (NORCO) 5-325 MG per tablet, Take 1 tablet by mouth every 6 (six) hours as needed., Disp: , Rfl: ;  methocarbamol (ROBAXIN) 500 MG tablet, Take 500 mg by mouth 4 (four) times daily., Disp: , Rfl: ;  mupirocin ointment (BACTROBAN) 2 %, Apply 1 application topically as directed., Disp: , Rfl: ;  omeprazole (PRILOSEC) 20 MG capsule, Take 20 mg by mouth daily., Disp: , Rfl:  polyethylene glycol (MIRALAX / GLYCOLAX) packet, Take 17 g by mouth daily., Disp: , Rfl: ;  Protein (PROCEL) POWD, Take by mouth 2 (two) times daily., Disp: , Rfl: ;  sertraline (ZOLOFT) 25 MG tablet, Take 25 mg by mouth daily., Disp: , Rfl: ;  sucralfate (CARAFATE) 1 G tablet, Take 1 g by mouth 4 (four) times daily., Disp: , Rfl: ;  tiotropium (SPIRIVA) 18 MCG inhalation capsule, Place 18 mcg into inhaler and inhale daily., Disp: , Rfl:  Warfarin Sodium (COUMADIN PO), Take by mouth as directed., Disp: , Rfl:   Allergies: No Known Allergies  Past Medical History, Surgical history, Social history, and Family History were reviewed and updated.  Physical Exam:  There were no vitals taken for this visit. ECOG:   Lab Results:   Lab 08/15/11 0941  WBC 14.6*  HGB 13.9  HCT 40.3  PLT 510*  MCV 89.5  MCH 30.9  MCHC 34.5  RDW 13.1  LYMPHSABS 2.9  MONOABS 1.7*  EOSABS 1.3*  BASOSABS 0.0  BANDABS --    CMP   No results found for this basename:  NA:5,K:5,CL:5,CO2:5,GLUCOSE:5,BUN:5,CREATININE:5,GFRCGP,:5,CALCIUM:5,MG:5,AST:5,ALT:5,ALKPHOS:5,BILITOT:5 in the last 168 hours      Component Value Date/Time   BILITOT 0.6 07/16/2010 1757   BILIDIR 0.2 03/02/2010 0550   IBILI 0.6 03/02/2010 0550    Radiological Studies:  Dg Bone Density  08/14/2011  *RADIOLOGY REPORT*  Clinical Data: 78 year old postmenopausal female on Fosamax.  The patient has a history of bilateral hip fractures.  DUAL X-RAY ABSORPTIOMETRY (DXA) FOR BONE MINERAL DENSITY  AP LUMBAR SPINE (L1-L4)  Bone Mineral Density (BMD):            0.605 g/cm2 Young Adult T Score:                          -4.0  LEFT FOREARM (1/3 RADIUS)  Bone Mineral Density (BMD):                     0.514 g/cm2 Young Adult T Score:                                  -3.0  ASSESSMENT:  Patient's diagnostic category is OSTEOPOROSIS by WHO Criteria.  FRACTURE RISK: INCREASED  FRAX: World Health Organization FRAX assessment of absolute fracture risk is not calculated for this patient because the patient has osteoporosis.  Comparison: None  RECOMMENDATIONS:  Effective therapies are available in the form of bisphosphonates, selective estrogen receptor modulators, biologic agents, and hormone replacement therapy (for women).  All patients should ensure an adequate intake of dietary calcium (1200mg  daily) and vitamin D (800 IU daily) unless contraindicated.  All treatment decisions require clinical judgement and consideration of individual patient factors, including patient preferences, co-morbidities, previous drug use, risk factors not captured in the FRAX model (e.g., frailty, falls, vitamin D deficiency, increased bone turnover, interval significant decline in bone density) and possible under-or over-estimation of fracture risk by FRAX.  The National Osteoporosis Foundation recommends that FDA-approved medical therapies be considered in postmenopausal women and mean age 14 or older with a:        1)     Hip or vertebral  (clinical or morphometric) fracture.           2)    T-score of -2.5 or lower at the spine or hip. 3)    Ten-year fracture probability by FRAX of 3% or greater for hip fracture or 20% or greater for major osteoporotic fracture. FOLLOW-UP:  People with diagnosed cases of osteoporosis or at high risk for fracture should have regular bone mineral density tests.  For patients eligible for Medicare, routine testing is allowed once every 2 years.  The testing frequency can be increased to one year for patients who have rapidly progressing disease, those who are receiving or discontinuing medical therapy to restore bone mass, or have additional risk factors.  World Health Organization [facility]) Criteria:  Normal: T scores from +1.0 to -1.0 Low Bone Mass (Osteopenia): T scores between -1.0 and -2.5 Osteoporosis: T scores -2.5 and below  Comparison to Reference  Population:  T score is the key measure used in the diagnosis of osteoporosis and relative risk determination for fracture.  It provides a value for bone mass relative to the mean bone mass of a young adult reference population expressed in terms of standard deviation (SD).  Z score is the age-matched score showing the patient's values compared to a population matched for age, sex, and race.  This is also expressed in terms of standard deviation.  The patient may have values that compare favorably to the age-matched values and still be at increased risk for fracture.  Original Report Authenticated By: Littie Deeds. Judyann Munson, M.D.     Impression and Plan:  This is a 78 year old female with the following issues:   1. Leukocytosis and thrombocytosis, most likely reactive in nature.  She possibly could have signs of early myeloproliferative disorder.  However, her white count has actually decreased quite nicely from 26 down to  14.6.  Overall, there is really no indication for any further workup, and we will continue to monitor her for the time being.  2. Anemia.  This has  resolved.  3. Followup.  Bianca Khan will follow back up with Dr. Clelia Croft in 6 months but before then should there be questions or concerns.  Spent more than half the time coordinating care.    Marcos Eke, MD 2/12/201310:02 AM

## 2011-08-18 LAB — HM MAMMOGRAPHY

## 2011-09-08 DIAGNOSIS — R634 Abnormal weight loss: Secondary | ICD-10-CM | POA: Diagnosis not present

## 2011-09-08 DIAGNOSIS — I4891 Unspecified atrial fibrillation: Secondary | ICD-10-CM | POA: Diagnosis not present

## 2011-09-08 DIAGNOSIS — G4700 Insomnia, unspecified: Secondary | ICD-10-CM | POA: Diagnosis not present

## 2011-09-08 DIAGNOSIS — J209 Acute bronchitis, unspecified: Secondary | ICD-10-CM | POA: Diagnosis not present

## 2011-10-05 DIAGNOSIS — I2510 Atherosclerotic heart disease of native coronary artery without angina pectoris: Secondary | ICD-10-CM | POA: Diagnosis not present

## 2011-10-05 DIAGNOSIS — I4891 Unspecified atrial fibrillation: Secondary | ICD-10-CM | POA: Diagnosis not present

## 2011-10-05 DIAGNOSIS — R634 Abnormal weight loss: Secondary | ICD-10-CM | POA: Diagnosis not present

## 2011-10-05 DIAGNOSIS — D62 Acute posthemorrhagic anemia: Secondary | ICD-10-CM | POA: Diagnosis not present

## 2011-10-05 DIAGNOSIS — J4489 Other specified chronic obstructive pulmonary disease: Secondary | ICD-10-CM | POA: Diagnosis not present

## 2011-10-05 DIAGNOSIS — J449 Chronic obstructive pulmonary disease, unspecified: Secondary | ICD-10-CM | POA: Diagnosis not present

## 2011-10-10 DIAGNOSIS — R7989 Other specified abnormal findings of blood chemistry: Secondary | ICD-10-CM | POA: Diagnosis not present

## 2011-12-14 DIAGNOSIS — R0602 Shortness of breath: Secondary | ICD-10-CM | POA: Diagnosis not present

## 2011-12-19 ENCOUNTER — Telehealth: Payer: Self-pay | Admitting: Oncology

## 2011-12-19 NOTE — Telephone Encounter (Signed)
Returned dtr's call re r/s 8/13 appt. dtr not available. Lm w/relative for dtr to call back to r/s.

## 2011-12-21 ENCOUNTER — Telehealth: Payer: Self-pay | Admitting: Oncology

## 2011-12-21 NOTE — Telephone Encounter (Signed)
daughter l/m to r/s her 02/2012 to 01/16/12   aom

## 2011-12-22 DIAGNOSIS — R05 Cough: Secondary | ICD-10-CM | POA: Diagnosis not present

## 2011-12-23 ENCOUNTER — Encounter (HOSPITAL_COMMUNITY): Payer: Self-pay

## 2011-12-23 ENCOUNTER — Emergency Department (INDEPENDENT_AMBULATORY_CARE_PROVIDER_SITE_OTHER)
Admission: EM | Admit: 2011-12-23 | Discharge: 2011-12-23 | Disposition: A | Discharge status: Home / Self Care | Payer: Medicare Other | Source: Home / Self Care | Attending: Emergency Medicine | Admitting: Emergency Medicine

## 2011-12-23 DIAGNOSIS — R0982 Postnasal drip: Secondary | ICD-10-CM

## 2011-12-23 DIAGNOSIS — J329 Chronic sinusitis, unspecified: Secondary | ICD-10-CM

## 2011-12-23 HISTORY — DX: Gastro-esophageal reflux disease without esophagitis: K21.9

## 2011-12-23 NOTE — ED Notes (Signed)
Pt states she has had a sore throat for one week, states she just finished taking her antibiotic last night for bronchitis two week ago

## 2011-12-23 NOTE — Discharge Instructions (Signed)
Try using a neti pot to help your nasal drainage.  Use only distilled water and salt without iodine in it (such as kosher or sea salt).  You can also use the salt packets that come with some neti pots.  If you mix your own saline, the recipe is 1 cup warm water and 1/4 teaspoon salt.  You can use the neti pot as many times a day as you need to relieve your symptoms.    To soothe your sore throat, try chloraseptic spray or salt water gargles.  Fresh lemon is very soothing to sore throats.  You can squeeze it into water and drink the water or squeeze it into hot herbal tea; or you can just suck on a lemon slice.    Take Claritin (loratadine) for your nasal drainage once a day as currently scheduled.  Talk with Dr. Renato Gails on Tuesday if you are not feeling better.

## 2011-12-23 NOTE — ED Provider Notes (Signed)
History     CSN: [id number]  Arrival date & time 12/23/11  1143   First MD Initiated Contact with Patient 12/23/11 1404      Chief Complaint  Patient presents with  . Sore Throat    sore throat    (Consider location/radiation/quality/duration/timing/severity/associated sxs/prior treatment) HPI Comments: Pt reports sore throat all the time, worse when tries to eat food- feels "burning pain" when tries to eat food.  Better when drinks warm liquids like coffee or tea, also can drink milk and feels ok.  States has no appetite so hasn't been eating.  Also c/o coughing up clear mucus twice a day, in the evening and in the morning.    Patient is a 78 y.o. female presenting with pharyngitis. The history is provided by the patient and a relative.  Sore Throat This is a new problem. Episode onset: about 4-6 days ago. The problem occurs constantly. The problem has not changed since onset.Pertinent negatives include no chest pain, no abdominal pain, no headaches and no shortness of breath. Exacerbated by: eating. Relieved by: drinking warm liquids. Treatments tried: per pt's medicine record from assisted living, pt is taking claritin daily. The treatment provided no relief.    Past Medical History  Diagnosis Date  . COPD (chronic obstructive pulmonary disease)   . CHF (congestive heart failure)   . Edema   . Myocardial infarction   . Anxiety   . Hypertension   . Anemia   . Leukocytosis   . Acid reflux     Past Surgical History  Procedure Date  . Abdominal hysterectomy     History reviewed. No pertinent family history.  History  Substance Use Topics  . Smoking status: Not on file  . Smokeless tobacco: Not on file  . Alcohol Use:     OB History    Grav Para Term Preterm Abortions TAB SAB Ect Mult Living                  Review of Systems  Constitutional: Positive for appetite change. Negative for fever, chills and activity change.  HENT: Positive for sore throat and  postnasal drip. Negative for ear pain, congestion, rhinorrhea and sinus pressure.   Respiratory: Positive for cough. Negative for chest tightness and shortness of breath.   Cardiovascular: Negative for chest pain.  Gastrointestinal: Negative for abdominal pain.  Neurological: Negative for headaches.    Allergies  Review of patient's allergies indicates no known allergies.  Home Medications   Current Outpatient Rx  Name Route Sig Dispense Refill  . DABIGATRAN ETEXILATE MESYLATE 150 MG PO CAPS Oral Take 150 mg by mouth every 12 (twelve) hours.    Marland Kitchen DILTIAZEM HCL ER BEADS 240 MG PO CP24 Oral Take 240 mg by mouth daily.    Marland Kitchen DOXYCYCLINE HYCLATE 100 MG PO TBEC Oral Take 100 mg by mouth 2 (two) times daily.    . GUAIFENESIN ER 600 MG PO TB12 Oral Take 1,200 mg by mouth 2 (two) times daily.    Marland Kitchen HYDROCHLOROTHIAZIDE 25 MG PO TABS Oral Take 25 mg by mouth daily.    Marland Kitchen LORATADINE 10 MG PO TABS Oral Take 10 mg by mouth daily.    Marland Kitchen METFORMIN HCL 500 MG PO TABS Oral Take 500 mg by mouth 2 (two) times daily with a meal.    . MIRTAZAPINE 7.5 MG PO TABS Oral Take 7.5 mg by mouth at bedtime.    Marland Kitchen CEPASTAT 14.5 MG MT LOZG Oral Take 1  lozenge by mouth every 2 (two) hours as needed.    Marland Kitchen POLYETHYLENE GLYCOL 3350 PO PACK Oral Take 17 g by mouth daily.    Marland Kitchen TIOTROPIUM BROMIDE MONOHYDRATE 18 MCG IN CAPS Inhalation Place 18 mcg into inhaler and inhale daily.    . ACETAMINOPHEN ER 650 MG PO TBCR Oral Take 650 mg by mouth as needed.    . ALBUTEROL SULFATE (2.5 MG/3ML) 0.083% IN NEBU Nebulization Take 2.5 mg by nebulization every 6 (six) hours as needed.    . ALENDRONATE SODIUM 70 MG PO TABS Oral Take 70 mg by mouth every 7 (seven) days. Take with a full glass of water on an empty stomach.    . ALPRAZOLAM 0.5 MG PO TABS Oral Take 0.5 mg by mouth at bedtime as needed.    Marland Kitchen VIACTIV PO Oral Take by mouth daily.    Marland Kitchen DICLOFENAC SODIUM 1 % TD GEL Topical Apply topically.    Marland Kitchen DILTIAZEM HCL ER COATED BEADS 240 MG PO  CP24 Oral Take 240 mg by mouth daily.    Marland Kitchen DOXYCYCLINE HYCLATE 100 MG PO CAPS Oral Take 100 mg by mouth 2 (two) times daily.    . ERGOCALCIFEROL 50000 UNITS PO CAPS Oral Take 50,000 Units by mouth once a week.    Marland Kitchen FERROUS SULFATE 325 (65 FE) MG PO TABS Oral Take 325 mg by mouth daily with breakfast.    . HYDROCODONE-ACETAMINOPHEN 5-325 MG PO TABS Oral Take 1 tablet by mouth every 6 (six) hours as needed.    . METHOCARBAMOL 500 MG PO TABS Oral Take 500 mg by mouth 4 (four) times daily.    Marland Kitchen MUPIROCIN 2 % EX OINT Topical Apply 1 application topically as directed.    Marland Kitchen OMEPRAZOLE 20 MG PO CPDR Oral Take 20 mg by mouth daily.    Marland Kitchen ONDANSETRON HCL 4 MG PO TABS Oral Take 4 mg by mouth every 8 (eight) hours as needed.    Marland Kitchen PROCEL PO POWD Oral Take by mouth 2 (two) times daily.    . SENNA 8.6 MG PO TABS Oral Take 1 tablet by mouth.    . SERTRALINE HCL 25 MG PO TABS Oral Take 25 mg by mouth daily.    . SUCRALFATE 1 G PO TABS Oral Take 1 g by mouth 4 (four) times daily.    Marland Kitchen TIOTROPIUM BROMIDE MONOHYDRATE 18 MCG IN CAPS Inhalation Place 18 mcg into inhaler and inhale daily.    Marland Kitchen COUMADIN PO Oral Take by mouth as directed.      BP 125/56  Pulse 73  Temp 98.3 F (36.8 C) (Oral)  Resp 18  Wt 87 lb (39.463 kg)  SpO2 100%  Physical Exam  Constitutional: She appears well-developed and well-nourished. No distress.       Smiling, cooperative, relaxed during hx and exam.  Pt in no distress, does not appear ill.   HENT:  Right Ear: Tympanic membrane, external ear and ear canal normal.  Left Ear: External ear normal.  Nose: No mucosal edema or rhinorrhea. Right sinus exhibits no maxillary sinus tenderness and no frontal sinus tenderness. Left sinus exhibits no maxillary sinus tenderness and no frontal sinus tenderness.  Mouth/Throat: Oropharynx is clear and moist. No uvula swelling.       L canal with cerumen impaction, TM not seen.   Cardiovascular: Normal rate and regular rhythm.   Pulmonary/Chest:  Effort normal and breath sounds normal.  Lymphadenopathy:       Head (right side): No  submental, no submandibular, no tonsillar, no preauricular, no posterior auricular and no occipital adenopathy present.       Head (left side): No submental, no submandibular, no tonsillar, no preauricular, no posterior auricular and no occipital adenopathy present.    She has no cervical adenopathy.    ED Course  Procedures (including critical care time)  Labs Reviewed - No data to display No results found.   1. Post-nasal drainage       MDM  Exam benign, pt appears well.  Post nasal drainage (residual from recent URI) vs. GERD.  Pt already taking loratadine and prilosec; there are not any decongestants I would be comfortable using for this pt.  Recommended non-pharmacologic management strategies for nasal congestion/post nasal drainage.  Pt happy with tx plan.  Pt has f/u with pcp in 3 days and will discuss sx if no improvement.  Daughter in law concerned that pt doesn't have an appetite.  I suspect poor "appetite" is related to sore throat and that pt will be more interested in eating when not having throat pain or "burning" pain with eating.  Suggested soft, bland foods for now.        Cathlyn Parsons, NP 12/23/11 1526

## 2011-12-23 NOTE — ED Notes (Signed)
[facility] nurse called re: neti pot per daughter told to use 4 x a day - home needs this in writing - faxed discharge instructions wrote in  4x a day as needed per angela kabbe np

## 2011-12-24 NOTE — ED Provider Notes (Signed)
Medical screening examination/treatment/procedure(s) were performed by non-physician practitioner and as supervising physician I was immediately available for consultation/collaboration.  Luiz Blare MD   Luiz Blare, MD 12/24/11 2122

## 2011-12-26 DIAGNOSIS — R627 Adult failure to thrive: Secondary | ICD-10-CM | POA: Diagnosis not present

## 2011-12-26 DIAGNOSIS — R6889 Other general symptoms and signs: Secondary | ICD-10-CM | POA: Diagnosis not present

## 2011-12-26 DIAGNOSIS — R911 Solitary pulmonary nodule: Secondary | ICD-10-CM | POA: Diagnosis not present

## 2011-12-26 DIAGNOSIS — J209 Acute bronchitis, unspecified: Secondary | ICD-10-CM | POA: Diagnosis not present

## 2012-01-16 ENCOUNTER — Other Ambulatory Visit (HOSPITAL_BASED_OUTPATIENT_CLINIC_OR_DEPARTMENT_OTHER): Payer: Medicare Other

## 2012-01-16 ENCOUNTER — Ambulatory Visit (HOSPITAL_BASED_OUTPATIENT_CLINIC_OR_DEPARTMENT_OTHER): Payer: Medicare Other | Admitting: Oncology

## 2012-01-16 ENCOUNTER — Other Ambulatory Visit: Payer: Self-pay | Admitting: Oncology

## 2012-01-16 VITALS — BP 157/90 | HR 102 | Temp 98.7°F | Ht 61.0 in | Wt 86.5 lb

## 2012-01-16 DIAGNOSIS — D473 Essential (hemorrhagic) thrombocythemia: Secondary | ICD-10-CM | POA: Diagnosis not present

## 2012-01-16 DIAGNOSIS — D72829 Elevated white blood cell count, unspecified: Secondary | ICD-10-CM

## 2012-01-16 LAB — CBC WITH DIFFERENTIAL/PLATELET
Basophils Absolute: 0.1 10*3/uL (ref 0.0–0.1)
Eosinophils Absolute: 1.2 10*3/uL — ABNORMAL HIGH (ref 0.0–0.5)
HGB: 13.5 g/dL (ref 11.6–15.9)
LYMPH%: 22.6 % (ref 14.0–49.7)
MCV: 87.7 fL (ref 79.5–101.0)
MONO%: 11.9 % (ref 0.0–14.0)
NEUT#: 9 10*3/uL — ABNORMAL HIGH (ref 1.5–6.5)
Platelets: 406 10*3/uL — ABNORMAL HIGH (ref 145–400)

## 2012-01-16 LAB — COMPREHENSIVE METABOLIC PANEL
CO2: 30 mEq/L (ref 19–32)
Glucose, Bld: 99 mg/dL (ref 70–99)
Sodium: 138 mEq/L (ref 135–145)
Total Bilirubin: 0.4 mg/dL (ref 0.3–1.2)
Total Protein: 6 g/dL (ref 6.0–8.3)

## 2012-01-16 NOTE — Progress Notes (Signed)
Hematology and Oncology Follow Up Visit  Bianca Khan [id number] [birth year] 78 y.o. 01/16/2012 1:53 PM   PRINCIPAL DIAGNOSIS:  This is a 78 year old female with leukocytosis, most likely reactive versus early sign of myeloproliferative disorder.  INTERIM HISTORY:  Bianca Khan presents today for an office followup visit.  Overall, Bianca Khan reports she has been doing relatively well.  She has not reported any recent hospitalizations, any new medications,she continues on Pradaxa. [facility] at [facility] and has continued to do rehab quite nicely.  Overall performance status and activity level continue to improve slowly.  She does use a rolling walker to get around with and ambulate.  She otherwise is not reporting any headaches, visual changes, any dysphagia, odynophagia, any nausea, vomiting, diarrhea, constipation, chest pain, shortness of breath, productive cough, abdominal pain, abdominal swelling, lower extremity paresthesias, bowel or bladder  incontinence.  She has not reported any fevers, chills, night sweats or any obvious bleeding such as melena, hematochezia or hematuria.  REVIEW OF SYSTEMS:  She denies changes in her vision, hearing, adenopathy, fever, chills, nausea, vomiting, diarrhea, constipation, chest pain, shortness of breath, passing blood, blacking out, passing out, changes in skin, joints, neurologic or psychiatric except as noted.  Medications:  Current outpatient prescriptions:acetaminophen (TYLENOL) 650 MG CR tablet, Take 650 mg by mouth as needed., Disp: , Rfl: ;  albuterol (PROVENTIL) (2.5 MG/3ML) 0.083% nebulizer solution, Take 2.5 mg by nebulization every 6 (six) hours as needed., Disp: , Rfl: ;  alendronate (FOSAMAX) 70 MG tablet, Take 70 mg by mouth every 7 (seven) days. Take with a full glass of water on an empty stomach., Disp: , Rfl:  ALPRAZolam (XANAX) 0.5 MG tablet, Take 0.5 mg by mouth at bedtime as needed., Disp: , Rfl: ;  Calcium-Vitamin D-Vitamin  K (VIACTIV PO), Take by mouth daily., Disp: , Rfl: ;  dabigatran (PRADAXA) 150 MG CAPS, Take 150 mg by mouth every 12 (twelve) hours., Disp: , Rfl: ;  diclofenac sodium (VOLTAREN) 1 % GEL, Apply topically., Disp: , Rfl: ;  diltiazem (CARDIZEM CD) 240 MG 24 hr capsule, Take 240 mg by mouth daily., Disp: , Rfl:  diltiazem (TIAZAC) 240 MG 24 hr capsule, Take 240 mg by mouth daily., Disp: , Rfl: ;  doxycycline (DORYX) 100 MG EC tablet, Take 100 mg by mouth 2 (two) times daily., Disp: , Rfl: ;  doxycycline (VIBRAMYCIN) 100 MG capsule, Take 100 mg by mouth 2 (two) times daily., Disp: , Rfl: ;  ergocalciferol (VITAMIN D2) 50000 UNITS capsule, Take 50,000 Units by mouth once a week., Disp: , Rfl:  ferrous sulfate 325 (65 FE) MG tablet, Take 325 mg by mouth daily with breakfast., Disp: , Rfl: ;  guaiFENesin (MUCINEX) 600 MG 12 hr tablet, Take 1,200 mg by mouth 2 (two) times daily., Disp: , Rfl: ;  hydrochlorothiazide (HYDRODIURIL) 25 MG tablet, Take 25 mg by mouth daily., Disp: , Rfl: ;  HYDROcodone-acetaminophen (NORCO) 5-325 MG per tablet, Take 1 tablet by mouth every 6 (six) hours as needed., Disp: , Rfl:  loratadine (CLARITIN) 10 MG tablet, Take 10 mg by mouth daily., Disp: , Rfl: ;  metFORMIN (GLUCOPHAGE) 500 MG tablet, Take 500 mg by mouth 2 (two) times daily with a meal., Disp: , Rfl: ;  methocarbamol (ROBAXIN) 500 MG tablet, Take 500 mg by mouth 4 (four) times daily., Disp: , Rfl: ;  mirtazapine (REMERON) 7.5 MG tablet, Take 7.5 mg by mouth at bedtime., Disp: , Rfl:  mupirocin ointment (BACTROBAN)  2 %, Apply 1 application topically as directed., Disp: , Rfl: ;  omeprazole (PRILOSEC) 20 MG capsule, Take 20 mg by mouth daily., Disp: , Rfl: ;  ondansetron (ZOFRAN) 4 MG tablet, Take 4 mg by mouth every 8 (eight) hours as needed., Disp: , Rfl: ;  phenol-menthol (CEPASTAT) 14.5 MG lozenge, Take 1 lozenge by mouth every 2 (two) hours as needed., Disp: , Rfl:  polyethylene glycol (MIRALAX / GLYCOLAX) packet, Take 17 g  by mouth daily., Disp: , Rfl: ;  Protein (PROCEL) POWD, Take by mouth 2 (two) times daily., Disp: , Rfl: ;  senna (SENOKOT) 8.6 MG TABS, Take 1 tablet by mouth., Disp: , Rfl: ;  sertraline (ZOLOFT) 25 MG tablet, Take 25 mg by mouth daily., Disp: , Rfl: ;  sucralfate (CARAFATE) 1 G tablet, Take 1 g by mouth 4 (four) times daily., Disp: , Rfl:  tiotropium (SPIRIVA) 18 MCG inhalation capsule, Place 18 mcg into inhaler and inhale daily., Disp: , Rfl: ;  tiotropium (SPIRIVA) 18 MCG inhalation capsule, Place 18 mcg into inhaler and inhale daily., Disp: , Rfl: ;  Warfarin Sodium (COUMADIN PO), Take by mouth as directed., Disp: , Rfl:   Allergies: No Known Allergies  Past Medical History, Surgical history, Social history, and Family History were reviewed and updated.   Physical Exam:  Blood pressure 157/90, pulse 102, temperature 98.7 F (37.1 C), temperature source Oral, height 5\' 1"  (1.549 m), weight 86 lb 8 oz (39.236 kg). ECOG:   Lab Results:   Lab 01/16/12 1312  WBC 15.6*  HGB 13.5  HCT 39.1  PLT 406*  MCV 87.7  MCH 30.3  MCHC 34.5  RDW 13.0  LYMPHSABS 3.5*  MONOABS 1.9*  EOSABS 1.2*  BASOSABS 0.1  BANDABS --     Impression and Plan:  This is a 78 year old female with the following issues:   1. Leukocytosis and thrombocytosis, most likely reactive in nature.  She possibly could have signs of early myeloproliferative disorder.  However, her white count has actually decreased quite nicely from 26 down to  15.6.  Overall, there is really no indication for any further workup, and we will continue to monitor her for the time being. This has been noted for the last few years.  2. Anemia.  This has resolved.  3. Followup.  Bianca [facility] will follow up as needed. I do recommend annual CBC and if her counts change dramatically, we will revaluate her at that time.       Eli Hose, MD 7/16/20131:53 PM

## 2012-01-23 DIAGNOSIS — D62 Acute posthemorrhagic anemia: Secondary | ICD-10-CM | POA: Diagnosis not present

## 2012-01-23 DIAGNOSIS — I10 Essential (primary) hypertension: Secondary | ICD-10-CM | POA: Diagnosis not present

## 2012-01-25 DIAGNOSIS — I4891 Unspecified atrial fibrillation: Secondary | ICD-10-CM | POA: Diagnosis not present

## 2012-01-25 DIAGNOSIS — J4489 Other specified chronic obstructive pulmonary disease: Secondary | ICD-10-CM | POA: Diagnosis not present

## 2012-01-25 DIAGNOSIS — R609 Edema, unspecified: Secondary | ICD-10-CM | POA: Diagnosis not present

## 2012-01-25 DIAGNOSIS — R627 Adult failure to thrive: Secondary | ICD-10-CM | POA: Diagnosis not present

## 2012-01-25 DIAGNOSIS — J449 Chronic obstructive pulmonary disease, unspecified: Secondary | ICD-10-CM | POA: Diagnosis not present

## 2012-02-13 ENCOUNTER — Other Ambulatory Visit: Payer: Medicare Other | Admitting: Lab

## 2012-02-13 ENCOUNTER — Ambulatory Visit: Payer: Medicare Other | Admitting: Oncology

## 2012-03-12 DIAGNOSIS — J209 Acute bronchitis, unspecified: Secondary | ICD-10-CM | POA: Diagnosis not present

## 2012-03-14 DIAGNOSIS — R6889 Other general symptoms and signs: Secondary | ICD-10-CM | POA: Diagnosis not present

## 2012-03-22 DIAGNOSIS — R109 Unspecified abdominal pain: Secondary | ICD-10-CM | POA: Diagnosis not present

## 2012-03-23 ENCOUNTER — Emergency Department (HOSPITAL_COMMUNITY): Payer: Medicare Other

## 2012-03-23 ENCOUNTER — Inpatient Hospital Stay (HOSPITAL_COMMUNITY)
Admission: EM | Admit: 2012-03-23 | Discharge: 2012-04-11 | DRG: 335 | Disposition: A | Discharge status: Nursing Home (Includes ICF) | Payer: Medicare Other | Attending: Internal Medicine | Admitting: Internal Medicine

## 2012-03-23 ENCOUNTER — Encounter (HOSPITAL_COMMUNITY): Payer: Self-pay | Admitting: *Deleted

## 2012-03-23 DIAGNOSIS — Z01811 Encounter for preprocedural respiratory examination: Secondary | ICD-10-CM | POA: Diagnosis not present

## 2012-03-23 DIAGNOSIS — I10 Essential (primary) hypertension: Secondary | ICD-10-CM | POA: Diagnosis not present

## 2012-03-23 DIAGNOSIS — K56609 Unspecified intestinal obstruction, unspecified as to partial versus complete obstruction: Secondary | ICD-10-CM

## 2012-03-23 DIAGNOSIS — D473 Essential (hemorrhagic) thrombocythemia: Secondary | ICD-10-CM | POA: Diagnosis present

## 2012-03-23 DIAGNOSIS — J449 Chronic obstructive pulmonary disease, unspecified: Secondary | ICD-10-CM

## 2012-03-23 DIAGNOSIS — R1084 Generalized abdominal pain: Secondary | ICD-10-CM | POA: Diagnosis not present

## 2012-03-23 DIAGNOSIS — N179 Acute kidney failure, unspecified: Secondary | ICD-10-CM | POA: Diagnosis not present

## 2012-03-23 DIAGNOSIS — J438 Other emphysema: Secondary | ICD-10-CM | POA: Diagnosis not present

## 2012-03-23 DIAGNOSIS — E43 Unspecified severe protein-calorie malnutrition: Secondary | ICD-10-CM | POA: Diagnosis not present

## 2012-03-23 DIAGNOSIS — R112 Nausea with vomiting, unspecified: Secondary | ICD-10-CM | POA: Diagnosis not present

## 2012-03-23 DIAGNOSIS — K219 Gastro-esophageal reflux disease without esophagitis: Secondary | ICD-10-CM

## 2012-03-23 DIAGNOSIS — R110 Nausea: Secondary | ICD-10-CM | POA: Diagnosis not present

## 2012-03-23 DIAGNOSIS — K449 Diaphragmatic hernia without obstruction or gangrene: Secondary | ICD-10-CM | POA: Diagnosis not present

## 2012-03-23 DIAGNOSIS — E876 Hypokalemia: Secondary | ICD-10-CM | POA: Diagnosis not present

## 2012-03-23 DIAGNOSIS — R109 Unspecified abdominal pain: Secondary | ICD-10-CM

## 2012-03-23 DIAGNOSIS — Z09 Encounter for follow-up examination after completed treatment for conditions other than malignant neoplasm: Secondary | ICD-10-CM | POA: Diagnosis not present

## 2012-03-23 DIAGNOSIS — F411 Generalized anxiety disorder: Secondary | ICD-10-CM | POA: Diagnosis not present

## 2012-03-23 DIAGNOSIS — R000 Tachycardia, unspecified: Secondary | ICD-10-CM | POA: Diagnosis not present

## 2012-03-23 DIAGNOSIS — K565 Intestinal adhesions [bands], unspecified as to partial versus complete obstruction: Principal | ICD-10-CM | POA: Diagnosis present

## 2012-03-23 DIAGNOSIS — J4489 Other specified chronic obstructive pulmonary disease: Secondary | ICD-10-CM | POA: Diagnosis not present

## 2012-03-23 DIAGNOSIS — E119 Type 2 diabetes mellitus without complications: Secondary | ICD-10-CM | POA: Diagnosis present

## 2012-03-23 DIAGNOSIS — D72829 Elevated white blood cell count, unspecified: Secondary | ICD-10-CM | POA: Diagnosis not present

## 2012-03-23 DIAGNOSIS — E860 Dehydration: Secondary | ICD-10-CM | POA: Diagnosis not present

## 2012-03-23 DIAGNOSIS — E46 Unspecified protein-calorie malnutrition: Secondary | ICD-10-CM | POA: Diagnosis present

## 2012-03-23 DIAGNOSIS — R10819 Abdominal tenderness, unspecified site: Secondary | ICD-10-CM | POA: Diagnosis not present

## 2012-03-23 DIAGNOSIS — D649 Anemia, unspecified: Secondary | ICD-10-CM | POA: Diagnosis present

## 2012-03-23 DIAGNOSIS — D75839 Thrombocytosis, unspecified: Secondary | ICD-10-CM | POA: Diagnosis present

## 2012-03-23 DIAGNOSIS — I252 Old myocardial infarction: Secondary | ICD-10-CM | POA: Diagnosis not present

## 2012-03-23 DIAGNOSIS — K56600 Partial intestinal obstruction, unspecified as to cause: Secondary | ICD-10-CM | POA: Diagnosis present

## 2012-03-23 DIAGNOSIS — I509 Heart failure, unspecified: Secondary | ICD-10-CM | POA: Diagnosis not present

## 2012-03-23 DIAGNOSIS — J90 Pleural effusion, not elsewhere classified: Secondary | ICD-10-CM | POA: Diagnosis not present

## 2012-03-23 DIAGNOSIS — I272 Pulmonary hypertension, unspecified: Secondary | ICD-10-CM | POA: Diagnosis present

## 2012-03-23 DIAGNOSIS — I2510 Atherosclerotic heart disease of native coronary artery without angina pectoris: Secondary | ICD-10-CM | POA: Diagnosis present

## 2012-03-23 DIAGNOSIS — R143 Flatulence: Secondary | ICD-10-CM | POA: Diagnosis not present

## 2012-03-23 DIAGNOSIS — I5032 Chronic diastolic (congestive) heart failure: Secondary | ICD-10-CM | POA: Diagnosis not present

## 2012-03-23 DIAGNOSIS — F419 Anxiety disorder, unspecified: Secondary | ICD-10-CM | POA: Insufficient documentation

## 2012-03-23 DIAGNOSIS — Z9889 Other specified postprocedural states: Secondary | ICD-10-CM | POA: Diagnosis not present

## 2012-03-23 DIAGNOSIS — K2990 Gastroduodenitis, unspecified, without bleeding: Secondary | ICD-10-CM | POA: Diagnosis not present

## 2012-03-23 DIAGNOSIS — R1110 Vomiting, unspecified: Secondary | ICD-10-CM | POA: Diagnosis not present

## 2012-03-23 LAB — CBC WITH DIFFERENTIAL/PLATELET
Eosinophils Relative: 0 % (ref 0–5)
Lymphocytes Relative: 7 % — ABNORMAL LOW (ref 12–46)
Lymphs Abs: 1.6 10*3/uL (ref 0.7–4.0)
MCV: 90.3 fL (ref 78.0–100.0)
Neutro Abs: 19.3 10*3/uL — ABNORMAL HIGH (ref 1.7–7.7)
Neutrophils Relative %: 85 % — ABNORMAL HIGH (ref 43–77)
Platelets: 459 10*3/uL — ABNORMAL HIGH (ref 150–400)
RBC: 4.53 MIL/uL (ref 3.87–5.11)
WBC: 22.7 10*3/uL — ABNORMAL HIGH (ref 4.0–10.5)

## 2012-03-23 LAB — URINE MICROSCOPIC-ADD ON

## 2012-03-23 LAB — COMPREHENSIVE METABOLIC PANEL
ALT: 8 U/L (ref 0–35)
Alkaline Phosphatase: 77 U/L (ref 39–117)
CO2: 37 mEq/L — ABNORMAL HIGH (ref 19–32)
Chloride: 91 mEq/L — ABNORMAL LOW (ref 96–112)
GFR calc Af Amer: 46 mL/min — ABNORMAL LOW (ref >90)
GFR calc non Af Amer: 40 mL/min — ABNORMAL LOW (ref >90)
Glucose, Bld: 164 mg/dL — ABNORMAL HIGH (ref 70–99)
Potassium: 3.5 mEq/L (ref 3.5–5.1)
Sodium: 141 mEq/L (ref 135–145)
Total Bilirubin: 0.6 mg/dL (ref 0.3–1.2)

## 2012-03-23 LAB — POCT I-STAT, CHEM 8
BUN: 27 mg/dL — ABNORMAL HIGH (ref 6–23)
Calcium, Ion: 1.34 mmol/L — ABNORMAL HIGH (ref 1.13–1.30)
Chloride: 90 mEq/L — ABNORMAL LOW (ref 96–112)
Creatinine, Ser: 1.3 mg/dL — ABNORMAL HIGH (ref 0.50–1.10)
Glucose, Bld: 162 mg/dL — ABNORMAL HIGH (ref 70–99)

## 2012-03-23 LAB — CK: Total CK: 28 U/L (ref 7–177)

## 2012-03-23 LAB — URINALYSIS, ROUTINE W REFLEX MICROSCOPIC
Bilirubin Urine: NEGATIVE
Glucose, UA: NEGATIVE mg/dL
Hgb urine dipstick: NEGATIVE
Protein, ur: NEGATIVE mg/dL
Urobilinogen, UA: 1 mg/dL (ref 0.0–1.0)

## 2012-03-23 LAB — MRSA PCR SCREENING: MRSA by PCR: NEGATIVE

## 2012-03-23 LAB — TYPE AND SCREEN: Antibody Screen: NEGATIVE

## 2012-03-23 LAB — LACTIC ACID, PLASMA: Lactic Acid, Venous: 2.3 mmol/L — ABNORMAL HIGH (ref 0.5–2.2)

## 2012-03-23 LAB — GLUCOSE, CAPILLARY: Glucose-Capillary: 110 mg/dL — ABNORMAL HIGH (ref 70–99)

## 2012-03-23 LAB — POCT I-STAT TROPONIN I: Troponin i, poc: 0.01 ng/mL (ref 0.00–0.08)

## 2012-03-23 LAB — PROTIME-INR: INR: 1.96 — ABNORMAL HIGH (ref 0.00–1.49)

## 2012-03-23 MED ORDER — ENOXAPARIN SODIUM 40 MG/0.4ML ~~LOC~~ SOLN
40.0000 mg | Freq: Every day | SUBCUTANEOUS | Status: DC
  Filled 2012-03-23: qty 0.4

## 2012-03-23 MED ORDER — SODIUM CHLORIDE 0.9 % IV SOLN
INTRAVENOUS | Status: DC
  Administered 2012-03-23 – 2012-03-27 (×5): via INTRAVENOUS
  Administered 2012-03-28: 50 mL/h via INTRAVENOUS
  Administered 2012-03-29 – 2012-03-30 (×2): via INTRAVENOUS
  Administered 2012-03-31: 10 mL/h via INTRAVENOUS

## 2012-03-23 MED ORDER — IOHEXOL 300 MG/ML  SOLN
64.0000 mL | Freq: Once | INTRAMUSCULAR | Status: AC | PRN
  Administered 2012-03-23: 64 mL via INTRAVENOUS

## 2012-03-23 MED ORDER — ALBUTEROL SULFATE HFA 108 (90 BASE) MCG/ACT IN AERS: 2.0000 | INHALATION_SPRAY | Freq: Four times a day (QID) | RESPIRATORY_TRACT | Status: DC | PRN

## 2012-03-23 MED ORDER — ENOXAPARIN SODIUM 30 MG/0.3ML ~~LOC~~ SOLN
30.0000 mg | SUBCUTANEOUS | Status: DC
  Administered 2012-03-23 – 2012-04-01 (×10): 30 mg via SUBCUTANEOUS
  Filled 2012-03-23 (×12): qty 0.3

## 2012-03-23 MED ORDER — ONDANSETRON HCL 4 MG/2ML IJ SOLN
4.0000 mg | Freq: Four times a day (QID) | INTRAMUSCULAR | Status: DC | PRN
  Administered 2012-03-25 – 2012-04-02 (×5): 4 mg via INTRAVENOUS
  Filled 2012-03-23 (×5): qty 2

## 2012-03-23 MED ORDER — MORPHINE SULFATE 2 MG/ML IJ SOLN
0.5000 mg | INTRAMUSCULAR | Status: DC | PRN
  Administered 2012-03-23 – 2012-03-26 (×5): 0.5 mg via INTRAVENOUS
  Filled 2012-03-23 (×5): qty 1

## 2012-03-23 MED ORDER — SODIUM CHLORIDE 0.45 % IV SOLN: INTRAVENOUS | Status: DC

## 2012-03-23 MED ORDER — PANTOPRAZOLE SODIUM 40 MG IV SOLR
40.0000 mg | Freq: Every day | INTRAVENOUS | Status: DC
  Administered 2012-03-23 – 2012-04-01 (×10): 40 mg via INTRAVENOUS
  Filled 2012-03-23 (×10): qty 40

## 2012-03-23 MED ORDER — ONDANSETRON HCL 4 MG PO TABS
4.0000 mg | ORAL_TABLET | Freq: Four times a day (QID) | ORAL | Status: DC | PRN
  Administered 2012-04-02: 4 mg via ORAL
  Filled 2012-03-23 (×2): qty 1

## 2012-03-23 MED ORDER — POTASSIUM CHLORIDE 10 MEQ/100ML IV SOLN
10.0000 meq | INTRAVENOUS | Status: AC
  Administered 2012-03-23 (×2): 10 meq via INTRAVENOUS
  Filled 2012-03-23 (×2): qty 100

## 2012-03-23 MED ORDER — TIOTROPIUM BROMIDE MONOHYDRATE 18 MCG IN CAPS
18.0000 ug | ORAL_CAPSULE | Freq: Every day | RESPIRATORY_TRACT | Status: DC
  Administered 2012-03-23 – 2012-04-11 (×19): 18 ug via RESPIRATORY_TRACT
  Filled 2012-03-23 (×4): qty 5

## 2012-03-23 MED ORDER — SODIUM CHLORIDE 0.9 % IV BOLUS (SEPSIS)
1000.0000 mL | Freq: Once | INTRAVENOUS | Status: AC
  Administered 2012-03-23: 1000 mL via INTRAVENOUS

## 2012-03-23 MED ORDER — ONDANSETRON HCL 4 MG/2ML IJ SOLN
INTRAMUSCULAR | Status: AC
  Filled 2012-03-23: qty 2

## 2012-03-23 NOTE — Progress Notes (Signed)
Pt with decreased output since initiation to low suction on unit. Wall suction meter changed x 3 per maintenance. Tubing checked for placement via auscultation with second RN. Tubing flushed.  Abdomen soft, non tender. Telephoned rapid response for second eval  Of system. Question whether all has been drained out already. Continue to low intermittent suction per order.

## 2012-03-23 NOTE — ED Notes (Signed)
Pt lives at [facility] facility. She has been vomiting x3 days. Has been given PO zofran, unable to tolerate anything orally. IV established enroute, fluids infusing, zofran given.

## 2012-03-23 NOTE — Progress Notes (Signed)
Pt admitted to the unit. Pt is alert and oriented. Pt oriented to room, staff, and call bell. Bed in lowest position. Full assessment to Epic. Call bell with in reach. Told to call for assists. [facility] admitting paged via amion for notification of arrival to the floor. Will continue to monitor. Burley Saver, RN

## 2012-03-23 NOTE — ED Notes (Signed)
Pt. Currently does not have any pain or nausea.

## 2012-03-23 NOTE — ED Notes (Signed)
Patient is resting comfortably. 

## 2012-03-23 NOTE — ED Notes (Signed)
Gave report to nurse for [phone]

## 2012-03-23 NOTE — Plan of Care (Signed)
Name: STORMEE DUDA MRN: [id number]  PCP: Enrique Sack, MD  78 y/o with 24 hour history of nausea and vomiting. History of COPD and chronic leukocytosis. Imaging on CT of Abd and pelvis showed partial obstruction. Labs showed mild dehydration with elevated creatinine (acute renal insuffiencey). ED in the process of contacting general surgery. ED planning on placing NG.  Recommendation to the ED Admit to [facility] to Med-Surg Gentle hydration NPO Once we know the patient's unit will have the appropriate hospitalist service admit the patient.  BP 130/62  Pulse 79  Temp 97.7 F (36.5 C) (Oral)  Resp 18  SpO2 92%  CBC    Component Value Date/Time   WBC 22.7* 03/23/2012 0441   WBC 15.6* 01/16/2012 1312   RBC 4.53 03/23/2012 0441   RBC 4.46 01/16/2012 1312   HGB 14.6 03/23/2012 0453   HGB 13.5 01/16/2012 1312   HCT 43.0 03/23/2012 0453   HCT 39.1 01/16/2012 1312   PLT 459* 03/23/2012 0441   PLT 406* 01/16/2012 1312   MCV 90.3 03/23/2012 0441   MCV 87.7 01/16/2012 1312   MCH 31.3 03/23/2012 0441   MCH 30.3 01/16/2012 1312   MCHC 34.7 03/23/2012 0441   MCHC 34.5 01/16/2012 1312   RDW 12.3 03/23/2012 0441   RDW 13.0 01/16/2012 1312   LYMPHSABS 1.6 03/23/2012 0441   LYMPHSABS 3.5* 01/16/2012 1312   MONOABS 1.7* 03/23/2012 0441   MONOABS 1.9* 01/16/2012 1312   EOSABS 0.1 03/23/2012 0441   EOSABS 1.2* 01/16/2012 1312   BASOSABS 0.0 03/23/2012 0441   BASOSABS 0.1 01/16/2012 1312    BMET    Component Value Date/Time   NA 137 03/23/2012 0453   K 3.4* 03/23/2012 0453   CL 90* 03/23/2012 0453   CO2 37* 03/23/2012 0441   GLUCOSE 162* 03/23/2012 0453   BUN 27* 03/23/2012 0453   CREATININE 1.30* 03/23/2012 0453   CALCIUM 12.3* 03/23/2012 0441   GFRNONAA 40* 03/23/2012 0441   GFRAA 46* 03/23/2012 0441    Lilibeth Opie A, MD 03/23/2012, 7:58 AM

## 2012-03-23 NOTE — ED Provider Notes (Signed)
History     CSN: [id number]  Arrival date & time 03/23/12  0335   First MD Initiated Contact with Patient 03/23/12 0402      Chief Complaint  Patient presents with  . Emesis    (Consider location/radiation/quality/duration/timing/severity/associated sxs/prior treatment) HPI  Please note that this is a late entry. This patient was seen by me shortly after her arrival to the emergency department via EMS from her skilled nursing facility.  The patient presents with 24 hours of nausea, vomiting and diffuse but intermittent abdominal pain. The patient says she has bouts of abdominal pain which are relieved by vomiting. She is currently pain-free. She denies fever. She says she has been unable to keep down any solid or liquid by mouth intake for the past 24 hours. She denies history of small bowel obstruction. Her only previous abdominal surgery is total abdominal hysterectomy, remotely. The patient denies any genitourinary symptoms.  With regards to her abdominal pain, the patient describes her pain as cramping waxing and waning in severity. She has not really noticed any preciptiating or worsening factors. However, as noted above, the patient says that her pain is relieved by vomiting.    Past Medical History  Diagnosis Date  . COPD (chronic obstructive pulmonary disease)   . CHF (congestive heart failure)   . Edema   . Myocardial infarction   . Anxiety   . Hypertension   . Anemia   . Leukocytosis   . Acid reflux     Past Surgical History  Procedure Date  . Abdominal hysterectomy     No family history on file.  History  Substance Use Topics  . Smoking status: Not on file  . Smokeless tobacco: Not on file  . Alcohol Use:     OB History    Grav Para Term Preterm Abortions TAB SAB Ect Mult Living                  Review of Systems  Gen: no weight loss, fevers, chills, night sweats, the patient reports that she feels generally weak Eyes: no discharge or drainage,  no occular pain or visual changes Nose: no epistaxis or rhinorrhea Mouth: no dental pain, no sore throat Neck: no neck pain Lungs: no SOB, cough, wheezing CV: no chest pain, palpitations, dependent edema or orthopnea Abd: As per history of present illness, otherwise negative GU: no dysuria or gross hematuria MSK: no myalgias or arthralgias Neuro: no headache, no focal neurologic deficits Skin: no rash Psyche: negative.  Allergies  Review of patient's allergies indicates no known allergies.  Home Medications   Current Outpatient Rx  Name Route Sig Dispense Refill  . ACETAMINOPHEN 325 MG PO TABS Oral Take 650 mg by mouth every 4 (four) hours as needed. Headache or pain    . ALBUTEROL SULFATE HFA 108 (90 BASE) MCG/ACT IN AERS Inhalation Inhale 2 puffs into the lungs every 6 (six) hours as needed. For wheezing cough shortness of breath Use with adapter    . ALENDRONATE SODIUM 70 MG PO TABS Oral Take 70 mg by mouth every 7 (seven) days. Take with a full glass of water on an empty stomach. Usually on sunday    . BARRIER CREAM NON-SPECIFIED Topical Apply 1 application topically 2 (two) times daily as needed. Moister barrier cream  Apply to buttock as needed for redness    . VIACTIV PO Oral Take 1 each by mouth 2 (two) times daily.     Marland Kitchen DABIGATRAN ETEXILATE MESYLATE  150 MG PO CAPS Oral Take 150 mg by mouth every 12 (twelve) hours.    Marland Kitchen DICLOFENAC SODIUM 1 % TD GEL Topical Apply 2 g topically every 8 (eight) hours as needed. For joint pain    . DILTIAZEM HCL ER COATED BEADS 240 MG PO CP24 Oral Take 240 mg by mouth daily.    . ERGOCALCIFEROL 50000 UNITS PO CAPS Oral Take 50,000 Units by mouth once a week. Usually on Sunday    . GUAIFENESIN ER 600 MG PO TB12 Oral Take 1,200 mg by mouth 2 (two) times daily. Cough and congestion    . HYDROCHLOROTHIAZIDE 25 MG PO TABS Oral Take 25 mg by mouth daily.    Marland Kitchen MENTHOL 3 MG MT LOZG Oral Take 1 lozenge by mouth every 4 (four) hours as needed. For sore  throat    . METFORMIN HCL 500 MG PO TABS Oral Take 500 mg by mouth 2 (two) times daily with a meal.    . METHOCARBAMOL 500 MG PO TABS Oral Take 500 mg by mouth 4 (four) times daily.    Marland Kitchen MIRTAZAPINE 15 MG PO TABS Oral Take 15 mg by mouth at bedtime.    . OMEPRAZOLE 20 MG PO CPDR Oral Take 20 mg by mouth daily.    Marland Kitchen ONDANSETRON HCL 4 MG PO TABS Oral Take 4 mg by mouth every 8 (eight) hours as needed. nausea    . PHENOL 1.4 % MT LIQD Mouth/Throat Use as directed 1 spray in the mouth or throat as needed. Sore throat    . POLYETHYLENE GLYCOL 3350 PO PACK Oral Take 17 g by mouth daily as needed. constipation    . BENEPROTEIN PO POWD Oral Take 1 scoop by mouth 2 (two) times daily with a meal.    . SENNOSIDES-DOCUSATE SODIUM 8.6-50 MG PO TABS Oral Take 2 tablets by mouth daily as needed. For constipation    . TIOTROPIUM BROMIDE MONOHYDRATE 18 MCG IN CAPS Inhalation Place 18 mcg into inhaler and inhale daily.    Marland Kitchen DOXYCYCLINE HYCLATE 100 MG PO CAPS Oral Take 100 mg by mouth 2 (two) times daily.    Marland Kitchen PROCEL PO POWD Oral Take by mouth 2 (two) times daily.      BP 130/62  Pulse 79  Temp 97.7 F (36.5 C) (Oral)  Resp 18  SpO2 92%  Physical Exam  Gen: Thin and frail appearing. Alert and oriented, conversant and appropriate. Head: NCAT Eyes: PERL, EOMI Nose: no epistaixis or rhinorrhea Mouth/throat: mucosa is pink but dehydrated appearing Neck: supple, no stridor no JVD Lungs: Breath sounds are diminished at both bases but there is no wheezing, rhonchi or rales, respiratory rate is 16-20 per minute CV: RRR, no murmur, exrtemities appear well perfused.  Abd: soft, notender, nondistended Back: no ttp, no cva ttp, significant kyphosis present. Skin: no rashese, wnl Neuro: CN ii-xii grossly intact Psyche; normal affect,  calm and cooperative.   ED Course  Procedures (including critical care time)  Labs Reviewed  CBC WITH DIFFERENTIAL - Abnormal; Notable for the following:    WBC 22.7 (*)       Platelets 459 (*)     Neutrophils Relative 85 (*)     Neutro Abs 19.3 (*)     Lymphocytes Relative 7 (*)     Monocytes Absolute 1.7 (*)     All other components within normal limits  COMPREHENSIVE METABOLIC PANEL - Abnormal; Notable for the following:    Chloride 91 (*)  CO2 37 (*)     Glucose, Bld 164 (*)     BUN 28 (*)     Creatinine, Ser 1.14 (*)     Calcium 12.3 (*)     GFR calc non Af Amer 40 (*)     GFR calc Af Amer 46 (*)     All other components within normal limits  LACTIC ACID, PLASMA - Abnormal; Notable for the following:    Lactic Acid, Venous 2.3 (*)     All other components within normal limits  URINALYSIS, ROUTINE W REFLEX MICROSCOPIC - Abnormal; Notable for the following:    APPearance CLOUDY (*)     Leukocytes, UA TRACE (*)     All other components within normal limits  PROTIME-INR - Abnormal; Notable for the following:    Prothrombin Time 21.6 (*)     INR 1.96 (*)     All other components within normal limits  POCT I-STAT, CHEM 8 - Abnormal; Notable for the following:    Potassium 3.4 (*)     Chloride 90 (*)     BUN 27 (*)     Creatinine, Ser 1.30 (*)     Glucose, Bld 162 (*)     Calcium, Ion 1.34 (*)     All other components within normal limits  URINE MICROSCOPIC-ADD ON - Abnormal; Notable for the following:    Bacteria, UA FEW (*)     All other components within normal limits  LIPASE, BLOOD  TYPE AND SCREEN  CK  MAGNESIUM  POCT I-STAT TROPONIN I  CULTURE, BLOOD (ROUTINE X 2)  CULTURE, BLOOD (ROUTINE X 2)   Ct Abdomen Pelvis W Contrast  03/23/2012  *RADIOLOGY REPORT*  Clinical Data: Acute nausea and vomiting.  CT ABDOMEN AND PELVIS WITH CONTRAST  Technique:  Multidetector CT imaging of the abdomen and pelvis was performed following the standard protocol during bolus administration of intravenous contrast.  Contrast: 64mL OMNIPAQUE IOHEXOL 300 MG/ML  SOLN  Comparison: None  Findings: The lung bases are clear.  No pericardial or pleural effusion.   There is no focal liver abnormality.  The gallbladder appears normal.  Calcifications within the head and uncinate process of the pancreas identified.  The body and tail of pancreas appear normal.  The spleen is normal.  The adrenal glands are both unremarkable.  Small hypodensities within the inferior pole of the right kidney noted. Too small to characterize.  The left kidney is unremarkable. No evidence for obstructive uropathy.  The urinary bladder appears within normal limits.  Previous hysterectomy.  No enlarged upper abdominal lymph nodes.  No pelvic or inguinal adenopathy.  There is no free fluid or fluid collections within the upper abdomen.  No pelvic free fluid or pelvic fluid collections identified.  Large hiatal hernia is noted.  The mid small bowel loops are increased in caliber and contain multiple fluid levels.  These measure up to 2.5 cm.  The colon has a normal caliber.  There are multiple colonic diverticula without evidence for diverticulitis.  The appendix is not visualize separate from the right lower quadrant bowel loops. Review of the visualized osseous structures is significant for degenerative disc disease at the L5-S1 level.  The patient is status post bilateral hardware fixation of the proximal femurs.  IMPRESSION:  1.  Increased caliber of the small bowel loops which measure up to 2.5 cm.  Findings may reflect partial obstruction or small bowel ileus. 2.  No perforation or evidence of abscess formation.  Original Report Authenticated By: Rosealee Albee, M.D.    Dg Chest Port 1 View  03/23/2012  *RADIOLOGY REPORT*  Clinical Data: COPD, CHF  PORTABLE CHEST - 1 VIEW  Comparison: 07/16/2010  Findings: The heart size is normal.  No pleural effusion or edema. The lungs appear hyperinflated but clear.  No airspace consolidation.  Right posterior seventh rib fracture deformity is noted.  IMPRESSION:  1.  No acute cardiopulmonary abnormalities.   Original Report Authenticated By: Rosealee Albee, M.D.     EKG: NSR, no acute ischemic changes, normal intervals, normal QRS complex, no change from previous  DDX: gastritis, PUD, GERD, pancreatitis, gallbladder disease, SBO, colitis, UTI, enteritis.     MDM  ED work up is notable for significant leukocytosis to 22K. Patient has a history of chronically elevated WBC - usually in the 14K to 18K range. Etiology of this is thus far undetermined. Patient is noted to have an elevated lactic acid as well as acute kidney injury which is secondary to dehydration. CT scan suggests incomplete SBO. Fortunately, the patient has been comfortable in the ED and has not had any subsequent episodes of nausea, vomiting or abdominal pain. However, she has been NPO. She remains afebrile with normal VS. Order written for NG tube.   We are treating with gentle fluid resuscitation. Patient is on 2nd liter of NS. I have paged [facility] to request consultation and have not heard back. Page request sent > 40 minutes ago. I have discussed the patient with Dr. Betti Cruz of [facility] Hospitalist service. He has agreed to admit the patient and has asked me to write holding orders.   CRITICAL CARE Performed by: Brandt Loosen   Total critical care time: 40  Critical care time was exclusive of separately billable procedures and treating other patients.  Critical care was necessary to treat or prevent imminent or life-threatening deterioration.  Critical care was time spent personally by me on the following activities: development of treatment plan with patient and/or surrogate as well as nursing, discussions with consultants, evaluation of patient's response to treatment, examination of patient, obtaining history from patient or surrogate, ordering and performing treatments and interventions, ordering and review of laboratory studies, ordering and review of radiographic studies, pulse oximetry and re-evaluation of patient's condition.         Brandt Loosen, MD 03/23/12  [phone]

## 2012-03-23 NOTE — ED Notes (Signed)
Insert NG tube 12 Fr . Pt tolerated,  350 cc output

## 2012-03-23 NOTE — H&P (Addendum)
[facility]          History and Physical    PCP:   GREEN, [name], MD  Per patient her PCP is Dr. Bufford Spikes.  Chief Complaint:  Abdominal pain, nausea, vomiting  HPI: 78 y/o woman with PMH significant for COPD, HTN, GERD, reported CAD and MI, who presents to the hospital with a 3 day h/o epigastric abdominal pain with nausea and vomiting. Has vomited 3 times today already. In the ED a CT scan showed evidence for an SBO, an NG tube was inserted and we have been asked to admit her for further evaluation and management. Per ED notes a surgery consult was called, however, no notes in the system yet.  Allergies:  No Known Allergies    Past Medical History  Diagnosis Date  . COPD (chronic obstructive pulmonary disease)   . CHF (congestive heart failure)   . Edema   . Myocardial infarction   . Anxiety   . Hypertension   . Anemia   . Leukocytosis   . Acid reflux     Past Surgical History  Procedure Date  . Abdominal hysterectomy     Prior to Admission medications   Medication Sig Start Date End Date Taking? Authorizing Provider  acetaminophen (TYLENOL) 325 MG tablet Take 650 mg by mouth every 4 (four) hours as needed. Headache or pain   Yes Historical Provider, MD  albuterol (PROVENTIL HFA;VENTOLIN HFA) 108 (90 BASE) MCG/ACT inhaler Inhale 2 puffs into the lungs every 6 (six) hours as needed. For wheezing cough shortness of breath Use with adapter   Yes Historical Provider, MD  alendronate (FOSAMAX) 70 MG tablet Take 70 mg by mouth every 7 (seven) days. Take with a full glass of water on an empty stomach. Usually on sunday   Yes Historical Provider, MD  barrier cream (NON-SPECIFIED) CREA Apply 1 application topically 2 (two) times daily as needed. Moister barrier cream  Apply to buttock as needed for redness   Yes Historical Provider, MD  Calcium-Vitamin D-Vitamin K (VIACTIV PO) Take 1 each by mouth 2 (two) times daily.    Yes Historical Provider, MD    dabigatran (PRADAXA) 150 MG CAPS Take 150 mg by mouth every 12 (twelve) hours.   Yes Historical Provider, MD  diclofenac sodium (VOLTAREN) 1 % GEL Apply 2 g topically every 8 (eight) hours as needed. For joint pain   Yes Historical Provider, MD  diltiazem (CARDIZEM CD) 240 MG 24 hr capsule Take 240 mg by mouth daily.   Yes Historical Provider, MD  ergocalciferol (VITAMIN D2) 50000 UNITS capsule Take 50,000 Units by mouth once a week. Usually on Sunday   Yes Historical Provider, MD  guaiFENesin (MUCINEX) 600 MG 12 hr tablet Take 1,200 mg by mouth 2 (two) times daily. Cough and congestion   Yes Historical Provider, MD  hydrochlorothiazide (HYDRODIURIL) 25 MG tablet Take 25 mg by mouth daily.   Yes Historical Provider, MD  menthol-cetylpyridinium (CEPACOL) 3 MG lozenge Take 1 lozenge by mouth every 4 (four) hours as needed. For sore throat   Yes Historical Provider, MD  metFORMIN (GLUCOPHAGE) 500 MG tablet Take 500 mg by mouth 2 (two) times daily with a meal.   Yes Historical Provider, MD  methocarbamol (ROBAXIN) 500 MG tablet Take 500 mg by mouth 4 (four) times daily.   Yes Historical Provider, MD  mirtazapine (REMERON) 15 MG tablet Take 15 mg by mouth at bedtime.   Yes Historical Provider, MD  omeprazole (PRILOSEC) 20  MG capsule Take 20 mg by mouth daily.   Yes Historical Provider, MD  ondansetron (ZOFRAN) 4 MG tablet Take 4 mg by mouth every 8 (eight) hours as needed. nausea   Yes Historical Provider, MD  phenol (PHENASEPTIC) 1.4 % LIQD Use as directed 1 spray in the mouth or throat as needed. Sore throat   Yes Historical Provider, MD  polyethylene glycol (MIRALAX / GLYCOLAX) packet Take 17 g by mouth daily as needed. constipation   Yes Historical Provider, MD  protein supplement (RESOURCE BENEPROTEIN) POWD Take 1 scoop by mouth 2 (two) times daily with a meal.   Yes Historical Provider, MD  senna-docusate (SENOKOT-S) 8.6-50 MG per tablet Take 2 tablets by mouth daily as needed. For constipation    Yes Historical Provider, MD  tiotropium (SPIRIVA) 18 MCG inhalation capsule Place 18 mcg into inhaler and inhale daily.   Yes Historical Provider, MD  doxycycline (VIBRAMYCIN) 100 MG capsule Take 100 mg by mouth 2 (two) times daily.    Historical Provider, MD  Protein (PROCEL) POWD Take by mouth 2 (two) times daily.    Historical Provider, MD    Social History:  does not have a smoking history on file. She does not have any smokeless tobacco history on file. Her alcohol and drug histories not on file.  No family history on file.  Review of Systems:  Constitutional: Denies fever, chills, diaphoresis, and fatigue.  HEENT: Denies photophobia, eye pain, redness, hearing loss, ear pain, congestion, sore throat, rhinorrhea, sneezing, mouth sores, trouble swallowing, neck pain, neck stiffness and tinnitus.   Respiratory: Denies SOB, DOE, cough, chest tightness,  and wheezing.   Cardiovascular: Denies chest pain, palpitations and leg swelling.  Gastrointestinal: Denies diarrhea, constipation, blood in stool and abdominal distention.  Genitourinary: Denies dysuria, urgency, frequency, hematuria, flank pain and difficulty urinating.  Musculoskeletal: Denies myalgias, back pain, joint swelling, arthralgias and gait problem.  Skin: Denies pallor, rash and wound.  Neurological: Denies dizziness, seizures, syncope, weakness, light-headedness, numbness and headaches.  Hematological: Denies adenopathy. Easy bruising, personal or family bleeding history  Psychiatric/Behavioral: Denies suicidal ideation, mood changes, confusion, nervousness, sleep disturbance and agitation   Physical Exam: Blood pressure 155/79, pulse 94, temperature 97.9 F (36.6 C), temperature source Oral, resp. rate 16, weight 30 kg (66 lb 2.2 oz), SpO2 93.00%. Gen: AAOx3, appears in remarkable shape for her advanced age. Heent: [place]/AT/PERRL/EOMI/wears corrective lenses/dry mucous membranes. NG in place. Neck: supple, no JVD, no LAD,  no bruits/no goiter. CV: RRR, no M/R/G. Lungs: CTA B. Abd: S/NT to palpation/ND/hypoactive BS/no masses or organomegaly noted. Ext: no C/C/E, positive pedal pulses. Neuro: grossly intact and nonfocal. I have not ambulated her. Skin: No rashes identified on exam.  Labs on Admission:  Results for orders placed during the hospital encounter of 03/23/12 (from the past 48 hour(s))  CBC WITH DIFFERENTIAL     Status: Abnormal   Collection Time   03/23/12  4:41 AM      Component Value Range Comment   WBC 22.7 (*) 4.0 - 10.5 K/uL    RBC 4.53  3.87 - 5.11 MIL/uL    Hemoglobin 14.2  12.0 - 15.0 g/dL    HCT 91.4  78.2 - 95.6 %    MCV 90.3  78.0 - 100.0 fL    MCH 31.3  26.0 - 34.0 pg    MCHC 34.7  30.0 - 36.0 g/dL    RDW 21.3  08.6 - 57.8 %    Platelets 459 (*) 150 -  400 K/uL    Neutrophils Relative 85 (*) 43 - 77 %    Neutro Abs 19.3 (*) 1.7 - 7.7 K/uL    Lymphocytes Relative 7 (*) 12 - 46 %    Lymphs Abs 1.6  0.7 - 4.0 K/uL    Monocytes Relative 8  3 - 12 %    Monocytes Absolute 1.7 (*) 0.1 - 1.0 K/uL    Eosinophils Relative 0  0 - 5 %    Eosinophils Absolute 0.1  0.0 - 0.7 K/uL    Basophils Relative 0  0 - 1 %    Basophils Absolute 0.0  0.0 - 0.1 K/uL   COMPREHENSIVE METABOLIC PANEL     Status: Abnormal   Collection Time   03/23/12  4:41 AM      Component Value Range Comment   Sodium 141  135 - 145 mEq/L    Potassium 3.5  3.5 - 5.1 mEq/L    Chloride 91 (*) 96 - 112 mEq/L    CO2 37 (*) 19 - 32 mEq/L    Glucose, Bld 164 (*) 70 - 99 mg/dL    BUN 28 (*) 6 - 23 mg/dL    Creatinine, Ser 4.54 (*) 0.50 - 1.10 mg/dL    Calcium 09.8 (*) 8.4 - 10.5 mg/dL    Total Protein 7.1  6.0 - 8.3 g/dL    Albumin 3.9  3.5 - 5.2 g/dL    AST 17  0 - 37 U/L    ALT 8  0 - 35 U/L    Alkaline Phosphatase 77  39 - 117 U/L    Total Bilirubin 0.6  0.3 - 1.2 mg/dL    GFR calc non Af Amer 40 (*) >90 mL/min    GFR calc Af Amer 46 (*) >90 mL/min   LACTIC ACID, PLASMA     Status: Abnormal   Collection Time    03/23/12  4:41 AM      Component Value Range Comment   Lactic Acid, Venous 2.3 (*) 0.5 - 2.2 mmol/L   LIPASE, BLOOD     Status: Normal   Collection Time   03/23/12  4:41 AM      Component Value Range Comment   Lipase 11  11 - 59 U/L   PROTIME-INR     Status: Abnormal   Collection Time   03/23/12  4:41 AM      Component Value Range Comment   Prothrombin Time 21.6 (*) 11.6 - 15.2 seconds    INR 1.96 (*) 0.00 - 1.49   CK     Status: Normal   Collection Time   03/23/12  4:41 AM      Component Value Range Comment   Total CK 28  7 - 177 U/L   MAGNESIUM     Status: Normal   Collection Time   03/23/12  4:41 AM      Component Value Range Comment   Magnesium 1.6  1.5 - 2.5 mg/dL   POCT I-STAT TROPONIN I     Status: Normal   Collection Time   03/23/12  4:51 AM      Component Value Range Comment   Troponin i, poc 0.01  0.00 - 0.08 ng/mL    Comment 3            POCT I-STAT, CHEM 8     Status: Abnormal   Collection Time   03/23/12  4:53 AM      Component Value Range Comment  Sodium 137  135 - 145 mEq/L    Potassium 3.4 (*) 3.5 - 5.1 mEq/L    Chloride 90 (*) 96 - 112 mEq/L    BUN 27 (*) 6 - 23 mg/dL    Creatinine, Ser 3.24 (*) 0.50 - 1.10 mg/dL    Glucose, Bld 401 (*) 70 - 99 mg/dL    Calcium, Ion 0.27 (*) 1.13 - 1.30 mmol/L    TCO2 36  0 - 100 mmol/L    Hemoglobin 14.6  12.0 - 15.0 g/dL    HCT 25.3  66.4 - 40.3 %   TYPE AND SCREEN     Status: Normal   Collection Time   03/23/12  5:04 AM      Component Value Range Comment   ABO/RH(D) O POS      Antibody Screen NEG      Sample Expiration 03/26/2012     URINALYSIS, ROUTINE W REFLEX MICROSCOPIC     Status: Abnormal   Collection Time   03/23/12  6:36 AM      Component Value Range Comment   Color, Urine YELLOW  YELLOW    APPearance CLOUDY (*) CLEAR    Specific Gravity, Urine 1.024  1.005 - 1.030    pH 7.5  5.0 - 8.0    Glucose, UA NEGATIVE  NEGATIVE mg/dL    Hgb urine dipstick NEGATIVE  NEGATIVE    Bilirubin Urine NEGATIVE  NEGATIVE     Ketones, ur NEGATIVE  NEGATIVE mg/dL    Protein, ur NEGATIVE  NEGATIVE mg/dL    Urobilinogen, UA 1.0  0.0 - 1.0 mg/dL    Nitrite NEGATIVE  NEGATIVE    Leukocytes, UA TRACE (*) NEGATIVE   URINE MICROSCOPIC-ADD ON     Status: Abnormal   Collection Time   03/23/12  6:36 AM      Component Value Range Comment   Squamous Epithelial / LPF RARE  RARE    WBC, UA 0-2  <3 WBC/hpf    RBC / HPF 0-2  <3 RBC/hpf    Bacteria, UA FEW (*) RARE   GLUCOSE, CAPILLARY     Status: Abnormal   Collection Time   03/23/12 10:39 AM      Component Value Range Comment   Glucose-Capillary 131 (*) 70 - 99 mg/dL     Radiological Exams on Admission: Ct Abdomen Pelvis W Contrast  03/23/2012  *RADIOLOGY REPORT*  Clinical Data: Acute nausea and vomiting.  CT ABDOMEN AND PELVIS WITH CONTRAST  Technique:  Multidetector CT imaging of the abdomen and pelvis was performed following the standard protocol during bolus administration of intravenous contrast.  Contrast: 64mL OMNIPAQUE IOHEXOL 300 MG/ML  SOLN  Comparison: None  Findings: The lung bases are clear.  No pericardial or pleural effusion.  There is no focal liver abnormality.  The gallbladder appears normal.  Calcifications within the head and uncinate process of the pancreas identified.  The body and tail of pancreas appear normal.  The spleen is normal.  The adrenal glands are both unremarkable.  Small hypodensities within the inferior pole of the right kidney noted. Too small to characterize.  The left kidney is unremarkable. No evidence for obstructive uropathy.  The urinary bladder appears within normal limits.  Previous hysterectomy.  No enlarged upper abdominal lymph nodes.  No pelvic or inguinal adenopathy.  There is no free fluid or fluid collections within the upper abdomen.  No pelvic free fluid or pelvic fluid collections identified.  Large hiatal hernia is noted.  The  mid small bowel loops are increased in caliber and contain multiple fluid levels.  These measure up  to 2.5 cm.  The colon has a normal caliber.  There are multiple colonic diverticula without evidence for diverticulitis.  The appendix is not visualize separate from the right lower quadrant bowel loops. Review of the visualized osseous structures is significant for degenerative disc disease at the L5-S1 level.  The patient is status post bilateral hardware fixation of the proximal femurs.  IMPRESSION:  1.  Increased caliber of the small bowel loops which measure up to 2.5 cm.  Findings may reflect partial obstruction or small bowel ileus. 2.  No perforation or evidence of abscess formation.   Original Report Authenticated By: Rosealee Albee, M.D.    Dg Chest Port 1 View  03/23/2012  *RADIOLOGY REPORT*  Clinical Data: COPD, CHF  PORTABLE CHEST - 1 VIEW  Comparison: 07/16/2010  Findings: The heart size is normal.  No pleural effusion or edema. The lungs appear hyperinflated but clear.  No airspace consolidation.  Right posterior seventh rib fracture deformity is noted.  IMPRESSION:  1.  No acute cardiopulmonary abnormalities.   Original Report Authenticated By: Rosealee Albee, M.D.     Assessment/Plan Principal Problem:  *Partial small bowel obstruction Active Problems:  Nausea and vomiting  Dehydration  Hypokalemia  Leukocytosis  COPD (chronic obstructive pulmonary disease)  Hypertension  Acid reflux  ARF (acute renal failure)   Partial SBO -Keep NG to suction today. -NPO. -No h/o prior abdominal surgeries. -Surgery has been consulted by EDP. -If clinically improved tomorrow, may clamp NG and attempt trial of clears.  ARF -Saline at 75 cc/hr given her NPO state and NG to suction. -Follow renal function daily.  Chronic Leukocytosis -Followed by Dr. Clelia Croft (heme/onc) -May have early myeloproliferative disorder. -Only observation at this point.  GERD -Protonix IV given NPO state.  COPD -Stable. -Continue inhalers.  Hypokalemia -K is 3.4. -I expect it to drop even further  given her NG to suction. -Will give 2 runs of IV KCl and follow in the am.  DVT Prophylaxis -Lovenox. -Patient noted to be on pradaxa according to med rec, cannot find a true indication: will contact PCPs office on Monday for further info. -Pradaxa has been held given her NPO state.   Time Spent on Admission: 70 minutes.  Chaya Jan [facility] Pager: [phone] 03/23/2012, 11:45 AM

## 2012-03-24 ENCOUNTER — Encounter (HOSPITAL_COMMUNITY): Payer: Self-pay

## 2012-03-24 LAB — GLUCOSE, CAPILLARY
Glucose-Capillary: 87 mg/dL (ref 70–99)
Glucose-Capillary: 75 mg/dL (ref 70–99)
Glucose-Capillary: 65 mg/dL — ABNORMAL LOW (ref 70–99)
Glucose-Capillary: 87 mg/dL (ref 70–99)

## 2012-03-24 LAB — MAGNESIUM: Magnesium: 1.2 mg/dL — ABNORMAL LOW (ref 1.5–2.5)

## 2012-03-24 LAB — CBC
MCH: 31.4 pg (ref 26.0–34.0)
MCV: 91.7 fL (ref 78.0–100.0)
Platelets: 386 10*3/uL (ref 150–400)
RDW: 12.3 % (ref 11.5–15.5)
WBC: 20 10*3/uL — ABNORMAL HIGH (ref 4.0–10.5)

## 2012-03-24 LAB — BASIC METABOLIC PANEL
CO2: 29 mEq/L (ref 19–32)
Calcium: 9.9 mg/dL (ref 8.4–10.5)
Creatinine, Ser: 0.85 mg/dL (ref 0.50–1.10)
Glucose, Bld: 85 mg/dL (ref 70–99)

## 2012-03-24 MED ORDER — POTASSIUM CHLORIDE 10 MEQ/100ML IV SOLN
10.0000 meq | INTRAVENOUS | Status: AC
  Administered 2012-03-24 (×5): 10 meq via INTRAVENOUS
  Filled 2012-03-24 (×5): qty 100

## 2012-03-24 NOTE — Progress Notes (Signed)
[facility]             Progress Note   Subjective: Feels much better. Only small amount of NG drainage. Wants to try clears. Son, August Saucer present and updated on plan of care.  Objective: Vital signs in last 24 hours: Temp:  [97.9 F (36.6 C)-98.7 F (37.1 C)] 98.5 F (36.9 C) (09/22 0825) Pulse Rate:  [88-102] 99  (09/22 0825) Resp:  [16-18] 18  (09/22 0825) BP: (117-155)/(64-79) 130/64 mmHg (09/22 0825) SpO2:  [91 %-95 %] 95 % (09/22 0825) Weight:  [30 kg (66 lb 2.2 oz)-30.709 kg (67 lb 11.2 oz)] 30.709 kg (67 lb 11.2 oz) (09/21 2044) Weight change:  Last BM Date: 03/21/12  Intake/Output from previous day: 09/21 0701 - 09/22 0700 In: 1180 [I.V.:1180] Out: 900 [Emesis/NG output:900]     Physical Exam: General: Alert, awake, oriented x3, in no acute distress. HEENT: No bruits, no goiter, NG in place. Heart: Regular rate and rhythm, without murmurs, rubs, gallops. Lungs: Clear to auscultation bilaterally. Abdomen: Soft, nontender, nondistended, positive bowel sounds. Extremities: No clubbing cyanosis or edema with positive pedal pulses. Neuro: Grossly intact, nonfocal.    Lab Results: Basic Metabolic Panel:  Basename 03/24/12 0520 03/23/12 0453 03/23/12 0441  NA 139 137 --  K 2.9* 3.4* --  CL 96 90* --  CO2 29 -- 37*  GLUCOSE 85 162* --  BUN 20 27* --  CREATININE 0.85 1.30* --  CALCIUM 9.9 -- 12.3*  MG -- -- 1.6  PHOS -- -- --   Liver Function Tests:  [facility] 03/23/12 0441  AST 17  ALT 8  ALKPHOS 77  BILITOT 0.6  PROT 7.1  ALBUMIN 3.9    Basename 03/23/12 0441  LIPASE 11  AMYLASE --   CBC:  Basename 03/24/12 0520 03/23/12 0453 03/23/12 0441  WBC 20.0* -- 22.7*  NEUTROABS -- -- 19.3*  HGB 12.9 14.6 --  HCT 37.7 43.0 --  MCV 91.7 -- 90.3  PLT 386 -- 459*   Cardiac Enzymes:  Basename 03/23/12 0441  CKTOTAL 28  CKMB --  CKMBINDEX --  TROPONINI --   CBG:  Basename 03/24/12 0951 03/24/12 0355 03/24/12 0011 03/23/12 2040  03/23/12 1620 03/23/12 1039  GLUCAP 75 87 98 98 110* 131*   Coagulation:  Basename 03/23/12 0441  LABPROT 21.6*  INR 1.96*   Urinalysis:  Basename 03/23/12 0636  COLORURINE YELLOW  LABSPEC 1.024  PHURINE 7.5  GLUCOSEU NEGATIVE  HGBUR NEGATIVE  BILIRUBINUR NEGATIVE  KETONESUR NEGATIVE  PROTEINUR NEGATIVE  UROBILINOGEN 1.0  NITRITE NEGATIVE  LEUKOCYTESUR TRACE*    Recent Results (from the past 240 hour(s))  MRSA PCR SCREENING     Status: Normal   Collection Time   03/23/12 11:10 AM      Component Value Range Status Comment   MRSA by PCR NEGATIVE  NEGATIVE Final     Studies/Results: Ct Abdomen Pelvis W Contrast  03/23/2012  *RADIOLOGY REPORT*  Clinical Data: Acute nausea and vomiting.  CT ABDOMEN AND PELVIS WITH CONTRAST  Technique:  Multidetector CT imaging of the abdomen and pelvis was performed following the standard protocol during bolus administration of intravenous contrast.  Contrast: 64mL OMNIPAQUE IOHEXOL 300 MG/ML  SOLN  Comparison: None  Findings: The lung bases are clear.  No pericardial or pleural effusion.  There is no focal liver abnormality.  The gallbladder appears normal.  Calcifications within the head and uncinate process of the pancreas identified.  The body and tail of pancreas appear normal.  The spleen is normal.  The adrenal glands are both unremarkable.  Small hypodensities within the inferior pole of the right kidney noted. Too small to characterize.  The left kidney is unremarkable. No evidence for obstructive uropathy.  The urinary bladder appears within normal limits.  Previous hysterectomy.  No enlarged upper abdominal lymph nodes.  No pelvic or inguinal adenopathy.  There is no free fluid or fluid collections within the upper abdomen.  No pelvic free fluid or pelvic fluid collections identified.  Large hiatal hernia is noted.  The mid small bowel loops are increased in caliber and contain multiple fluid levels.  These measure up to 2.5 cm.  The colon has  a normal caliber.  There are multiple colonic diverticula without evidence for diverticulitis.  The appendix is not visualize separate from the right lower quadrant bowel loops. Review of the visualized osseous structures is significant for degenerative disc disease at the L5-S1 level.  The patient is status post bilateral hardware fixation of the proximal femurs.  IMPRESSION:  1.  Increased caliber of the small bowel loops which measure up to 2.5 cm.  Findings may reflect partial obstruction or small bowel ileus. 2.  No perforation or evidence of abscess formation.   Original Report Authenticated By: Rosealee Albee, M.D.    Dg Chest Port 1 View  03/23/2012  *RADIOLOGY REPORT*  Clinical Data: COPD, CHF  PORTABLE CHEST - 1 VIEW  Comparison: 07/16/2010  Findings: The heart size is normal.  No pleural effusion or edema. The lungs appear hyperinflated but clear.  No airspace consolidation.  Right posterior seventh rib fracture deformity is noted.  IMPRESSION:  1.  No acute cardiopulmonary abnormalities.   Original Report Authenticated By: Rosealee Albee, M.D.     Medications: Scheduled Meds:   . enoxaparin (LOVENOX) injection  30 mg Subcutaneous Q24H  . pantoprazole (PROTONIX) IV  40 mg Intravenous Q1200  . potassium chloride  10 mEq Intravenous Q1 Hr x 2  . tiotropium  18 mcg Inhalation Daily  . DISCONTD: sodium chloride   Intravenous STAT  . DISCONTD: enoxaparin (LOVENOX) injection  40 mg Subcutaneous Q1200   Continuous Infusions:   . sodium chloride 75 mL/hr at 03/24/12 0239   PRN Meds:.albuterol, morphine injection, ondansetron (ZOFRAN) IV, ondansetron  Assessment/Plan:  Principal Problem:  *Partial small bowel obstruction Active Problems:  Nausea and vomiting  Dehydration  Hypokalemia  Leukocytosis  COPD (chronic obstructive pulmonary disease)  Hypertension  Acid reflux  ARF (acute renal failure)   PSBO -Clamp NG. -Trial of clears.  ARF -2/2 prerenal azotemia with GI  losses. -Resolved with IVF.  Hypokalemia -2/2 GI losses and NG suction. -Replete IV. Will give 5 runs. Will prob not be enough and may need PO doses once PO route established. -K is 2.9 today. -Check Mg level and replete as necessary.  Chronic Leukocytosis -Followed by Dr. Clelia Croft (heme/onc)  -May have early myeloproliferative disorder.  -Only observation at this point.  GERD  -Protonix IV given NPO state.   COPD  -Stable.  -Continue inhalers.  DVT Prophylaxis  -Lovenox.  -Patient noted to be on pradaxa according to med rec, cannot find a true indication: will contact PCPs office on Monday for further info.  -Pradaxa has been held given her NPO state.   Time spent coordinating care: 35 minutes.   LOS: 1 day   [facility] Pager: [phone] 03/24/2012, 10:11 AM

## 2012-03-25 ENCOUNTER — Inpatient Hospital Stay (HOSPITAL_COMMUNITY): Payer: Medicare Other

## 2012-03-25 LAB — GLUCOSE, CAPILLARY
Glucose-Capillary: 76 mg/dL (ref 70–99)
Glucose-Capillary: 74 mg/dL (ref 70–99)
Glucose-Capillary: 107 mg/dL — ABNORMAL HIGH (ref 70–99)

## 2012-03-25 LAB — CBC
HCT: 36.1 % (ref 36.0–46.0)
MCHC: 33.5 g/dL (ref 30.0–36.0)
Platelets: 354 10*3/uL (ref 150–400)
RDW: 12.2 % (ref 11.5–15.5)
WBC: 16.1 10*3/uL — ABNORMAL HIGH (ref 4.0–10.5)

## 2012-03-25 LAB — BASIC METABOLIC PANEL
BUN: 18 mg/dL (ref 6–23)
Calcium: 8.6 mg/dL (ref 8.4–10.5)
Creatinine, Ser: 0.7 mg/dL (ref 0.50–1.10)
GFR calc Af Amer: 83 mL/min — ABNORMAL LOW (ref >90)

## 2012-03-25 MED ORDER — MAGNESIUM SULFATE 40 MG/ML IJ SOLN
2.0000 g | Freq: Once | INTRAMUSCULAR | Status: AC
  Administered 2012-03-25: 2 g via INTRAVENOUS
  Filled 2012-03-25: qty 50

## 2012-03-25 MED ORDER — BOOST / RESOURCE BREEZE PO LIQD
1.0000 | Freq: Two times a day (BID) | ORAL | Status: DC
  Administered 2012-03-25 – 2012-04-02 (×9): 1 via ORAL

## 2012-03-25 NOTE — Progress Notes (Signed)
INITIAL ADULT NUTRITION ASSESSMENT Date: 03/25/2012   Time: 11:09 AM  Reason for Assessment: Malnutrition Screening  INTERVENTION: 1. Resource Breeze po BID, each supplement provides 250 kcal and 9 grams of protein. 2. Monitor magnesium, potassium, and phosphorus daily for at least 3 days, MD to replete as needed, as pt is at risk for refeeding syndrome given dx of severe chronic malnutrition. 3. If unable to advance diet soon, recommend initiation of short-term nutrition support given dx of severe malnutrition and nutritionally incomplete clear liquid diet order 3. RD to continue to follow nutrition care plan  DOCUMENTATION CODES Per approved criteria  -Severe malnutrition in the context of chronic illness -Underweight   ASSESSMENT: Female 78 y.o.  Dx: Partial small bowel obstruction  Hx:  Past Medical History  Diagnosis Date  . COPD (chronic obstructive pulmonary disease)   . CHF (congestive heart failure)   . Edema   . Myocardial infarction   . Anxiety   . Hypertension   . Anemia   . Leukocytosis   . Acid reflux    Past Surgical History  Procedure Date  . Abdominal hysterectomy    Related Meds:     . enoxaparin (LOVENOX) injection  30 mg Subcutaneous Q24H  . magnesium sulfate 1 - 4 g bolus IVPB  2 g Intravenous Once  . pantoprazole (PROTONIX) IV  40 mg Intravenous Q1200  . potassium chloride  10 mEq Intravenous Q1 Hr x 5  . tiotropium  18 mcg Inhalation Daily   Ht: 5' 3.5" (161.3 cm)  Wt: 68 lb 3.2 oz (30.935 kg)  Ideal Wt: 118 lb/53.4 kg % Ideal Wt: 58%  Wt Readings from Last 15 Encounters:  03/24/12 68 lb 3.2 oz (30.935 kg)  01/16/12 86 lb 8 oz (39.236 kg)  12/23/11 87 lb (39.463 kg)  08/15/11 92 lb 8 oz (41.958 kg)  03/09/11 99 lb 6.4 oz (45.088 kg)  Usual Wt: 86 - 92 lb % Usual Wt: 76%; 24% wt loss x 2 months  Body mass index is 11.89 kg/(m^2). Underweight.  Food/Nutrition Related Hx: variable intake x 2 weeks  Labs:  CMP     Component  Value Date/Time   NA 136 03/25/2012 0628   K 3.6 03/25/2012 0628   CL 98 03/25/2012 0628   CO2 26 03/25/2012 0628   GLUCOSE 79 03/25/2012 0628   BUN 18 03/25/2012 0628   CREATININE 0.70 03/25/2012 0628   CALCIUM 8.6 03/25/2012 0628   PROT 7.1 03/23/2012 0441   ALBUMIN 3.9 03/23/2012 0441   AST 17 03/23/2012 0441   ALT 8 03/23/2012 0441   ALKPHOS 77 03/23/2012 0441   BILITOT 0.6 03/23/2012 0441   GFRNONAA 71* 03/25/2012 0628   GFRAA 83* 03/25/2012 0628   Magnesium  Date/Time Value Range Status  03/24/2012 10:19 AM 1.2* 1.5 - 2.5 mg/dL Final   No phosphorus available.   Intake/Output Summary (Last 24 hours) at 03/25/12 1111 Last data filed at 03/25/12 1610  Gross per 24 hour  Intake   3470 ml  Output    300 ml  Net   3170 ml  BM 9/19  Diet Order: Full Liquid  Supplements/Tube Feeding: none  IVF:    sodium chloride Last Rate: 75 mL/hr at 03/24/12 1606   Estimated Nutritional Needs:   Kcal: 950 - 1100 kcal Protein: 30 - 40 grams Fluid: at least 1.2 liters daily  Admitted with abdominal pain, n/v x 3 days. CT work-up shows SBO. NGT placed for suction x 1 day.  NGT clamped yesterday with trial of clears. Pt is tolerating clear liquid diet, NGT clamped for last 18 hours. Pt has had a BM.  Per home meds, pt on remeron at home.  Stage II pressure ulcer on coccyx.  Pt states that she has not been eating well for at least the past 2 weeks. At most, pt will consume 50% of two meals daily, has toast for breakfast everyday. States that her appetite hasn't been the best.  Will drink Ensure but doesn't really care for it. Discussed current weight of 68# and pt unable to state why she has had this 24% wt loss x 2 months. Pt meets criteria for severe malnutrition in the context of chronic illness as evidenced by 24% wt loss x 2 months and less than 75% of estimated energy intake x 1 month.  Current magnesium low and being repleted. Potassium now WNL. No phosphorus available.  NUTRITION  DIAGNOSIS: Inadequate oral intake r/t poor appetite AEB pt report of ongoing weight loss and diminished PO intake.  MONITORING/EVALUATION(Goals): Goal: Pt to meet >/= 90% of their estimated nutrition needs Monitor: weight trends, lab trends, I/O's, PO intake, supplement tolerance  EDUCATION NEEDS: -Education not appropriate at this time  Jarold Motto MS, RD, LDN Pager: [phone] After-hours pager: [phone]

## 2012-03-25 NOTE — Clinical Documentation Improvement (Signed)
MALNUTRITION DOCUMENTATION CLARIFICATION  THIS DOCUMENT IS NOT A PERMANENT PART OF THE MEDICAL RECORD  TO RESPOND TO THE THIS QUERY, FOLLOW THE INSTRUCTIONS BELOW:  1. If needed, update documentation for the patient's encounter via the notes activity.  2. Access this query again and click edit on the In [employer].  3. After updating, or not, click F2 to complete all highlighted (required) [place] concerning your review. Select "additional documentation in the medical record" OR "no additional documentation provided".  4. Click Sign note button.  5. The deficiency will fall out of your In Basket *Please let us know if you are not able to complete this workflow by phone or e-mail (listed below).  Please update your documentation within the medical record to reflect your response to this query.                                                                                        03/25/12   Dear Dr. Gwenlyn Perking / Associates,  In a better effort to capture your patient's severity of illness, reflect appropriate length of stay and utilization of resources, a review of the patient medical record has revealed the following indicators.    Based on your clinical judgment, please clarify and document in a progress note and/or discharge summary the clinical condition associated with the following supporting information:  In responding to this query please exercise your independent judgment.  The fact that a query is asked, does not imply that any particular answer is desired or expected.  Possible Clinical Conditions?   Severe Malnutrition    _X_Severe Protein Calorie Malnutrition  Other Condition  Cannot clinically determine    Supporting Information: Risk Factors:(As per notes)  "SBO"  " Underweight"  Signs & Symptoms:(As per Nutrition Consult)   -Severe malnutrition in the context of chronic illness  -Underweight   INITIAL ADULT NUTRITION ASSESSMENT Date: 03/25/2012   Time: 11:09  AM "Pt meets criteria for severe malnutrition in the context of chronic illness as evidenced by 24% wt loss x 2 months and less than 75% of estimated energy intake x 1 month."  Ht: 5' 3.5" (161.3 cm)  Wt: 68 lb 3.2 oz (30.935 kg)  Ideal Wt: 118 lb/53.4 kg % Ideal Wt: 58  Treatment : ( As per Nutrition Assessment) INTERVENTION: 1. Resource Breeze po BID, each supplement provides 250 kcal and 9 grams of protein. 2. Monitor magnesium, potassium, and phosphorus daily for at least 3 days, MD to replete as needed, as pt is at risk for refeeding syndrome given dx of severe chronic malnutrition. 3. If unable to advance diet soon, recommend initiation of short-term nutrition support given dx of severe malnutrition and nutritionally incomplete clear liquid diet order 3. RD to continue to follow nutrition care plan    You may use possible, probable, or suspect with inpatient documentation. possible, probable, suspected diagnoses MUST be documented at the time of discharge  Reviewed: Severe protein calorie malnutrition; problem added to patient progress notes. Thanks!!!  Thank You,  Joanette Gula Delk RN, BSN Clinical Documentation Specialist: [phone] Pager Health Information Management [place]

## 2012-03-25 NOTE — Clinical Social Work Psychosocial (Signed)
     Clinical Social Work Department BRIEF PSYCHOSOCIAL ASSESSMENT 03/25/2012  Patient:  Bianca Khan, Bianca Khan     Account Number:  [id number]     Admit date:  03/23/2012  Clinical Social Worker:  Lourdes Sledge  Date/Time:  03/25/2012 11:30 AM  Referred by:  Physician  Date Referred:  03/25/2012 Referred for  ALF Placement   Other Referral:   Interview type:  Patient Other interview type:   CSW also confirmed with facility that pt is a LT resident from the facility.    PSYCHOSOCIAL DATA Living Status:  FACILITY Admitted from facility:  [facility] Level of care:  Assisted Living Primary support name:  Yazmyn Valbuena [phone] Primary support relationship to patient:  CHILD, ADULT Degree of support available:   Pt reports her son is very involved in her care.    CURRENT CONCERNS Current Concerns  Post-Acute Placement   Other Concerns:    SOCIAL WORK ASSESSMENT / PLAN CSW received a referral as pt was admitted from [facility].    CSW visited pt room and explored pt living situation. Pt informed that pt is a LT resident of [facility] where she has lived for 1 year. Pt stated she enjoys her living situation and would like to return when she is medically stable. CSW also explored whether pt has any family support. Pt stated her sons are very involved in her care and are aware that pt is in the hosiptal.    CSW contacted Tanya at [facility] who informed CSW that CSW is to follow up with her when pt is stable in order to evaluate pt over the phone before dc. Kenney Houseman does not anticipate there being any issues with pt returning at dc.    Covering CSW will complete an FL2 and facilitate with dc when pt is medically stable.   Assessment/plan status:  Psychosocial Support/Ongoing Assessment of Needs Other assessment/ plan:   Information/referral to community resources:   Pt will dc back to [facility]. Pt does not request any resources at this  time.    PATIENTS/FAMILYS RESPONSE TO PLAN OF CARE: Pt laying in bed alert and oriented. Pt states she feels much better than she did yesterday. Pt answered CSW questions appropriately and is agreeable to return to [facility] when medically stable.

## 2012-03-25 NOTE — Progress Notes (Addendum)
[facility]             Progress Note   Subjective: Feeling better. Denies CP, SOB, abdominal pain, N/V. Reports having a BM today and is currently tolerating CLD. NGT clamped for the last 18 hours.  Objective: Vital signs in last 24 hours: Temp:  [98 F (36.7 C)-98.9 F (37.2 C)] 98.6 F (37 C) (09/23 0924) Pulse Rate:  [88-98] 91  (09/23 0924) Resp:  [18-20] 19  (09/23 0924) BP: (114-149)/(56-77) 145/75 mmHg (09/23 0924) SpO2:  [92 %-96 %] 96 % (09/23 0924) Weight:  [30.935 kg (68 lb 3.2 oz)] 30.935 kg (68 lb 3.2 oz) (09/22 2006) Weight change: 0.935 kg (2 lb 1 oz) Last BM Date: 03/21/12  Intake/Output from previous day: 09/22 0701 - 09/23 0700 In: 2990 [P.O.:840; I.V.:1650; IV Piggyback:500] Out: 300 [Emesis/NG output:300] Total I/O In: 480 [P.O.:480] Out: -    Physical Exam: General: Alert, awake, oriented x3, in no acute distress. HEENT: No bruits, no goiter, NG in place. Heart: Regular rate and rhythm, without murmurs, rubs, gallops. Lungs: Clear to auscultation bilaterally. Abdomen: Soft, nontender, nondistended, positive bowel sounds. Extremities: No clubbing cyanosis or edema with positive pedal pulses. Neuro: Grossly intact, nonfocal.    Lab Results: Basic Metabolic Panel:  Basename 03/25/12 0628 03/24/12 1019 03/24/12 0520 03/23/12 0441  NA 136 -- 139 --  K 3.6 -- 2.9* --  CL 98 -- 96 --  CO2 26 -- 29 --  GLUCOSE 79 -- 85 --  BUN 18 -- 20 --  CREATININE 0.70 -- 0.85 --  CALCIUM 8.6 -- 9.9 --  MG -- 1.2* -- 1.6  PHOS -- -- -- --   Liver Function Tests:  [facility] 03/23/12 0441  AST 17  ALT 8  ALKPHOS 77  BILITOT 0.6  PROT 7.1  ALBUMIN 3.9    Basename 03/23/12 0441  LIPASE 11  AMYLASE --   CBC:  Basename 03/25/12 0628 03/24/12 0520 03/23/12 0441  WBC 16.1* 20.0* --  NEUTROABS -- -- 19.3*  HGB 12.1 12.9 --  HCT 36.1 37.7 --  MCV 90.7 91.7 --  PLT 354 386 --   Cardiac Enzymes:  Basename 03/23/12 0441  CKTOTAL 28    CKMB --  CKMBINDEX --  TROPONINI --   CBG:  Basename 03/25/12 0729 03/25/12 0407 03/25/12 0019 03/24/12 2003 03/24/12 1702 03/24/12 1140  GLUCAP 74 76 84 87 110* 65*   Coagulation:  Basename 03/23/12 0441  LABPROT 21.6*  INR 1.96*   Urinalysis:  Basename 03/23/12 0636  COLORURINE YELLOW  LABSPEC 1.024  PHURINE 7.5  GLUCOSEU NEGATIVE  HGBUR NEGATIVE  BILIRUBINUR NEGATIVE  KETONESUR NEGATIVE  PROTEINUR NEGATIVE  UROBILINOGEN 1.0  NITRITE NEGATIVE  LEUKOCYTESUR TRACE*    Recent Results (from the past 240 hour(s))  CULTURE, BLOOD (ROUTINE X 2)     Status: Normal (Preliminary result)   Collection Time   03/23/12  4:30 AM      Component Value Range Status Comment   Specimen Description BLOOD LEFT ARM   Final    Special Requests BOTTLES DRAWN AEROBIC ONLY 10CC   Final    Culture  Setup Time 03/23/2012 11:36   Final    Culture     Final    Value:        BLOOD CULTURE RECEIVED NO GROWTH TO DATE CULTURE WILL BE HELD FOR 5 DAYS BEFORE ISSUING A FINAL NEGATIVE REPORT   Report Status PENDING   Incomplete   CULTURE, BLOOD (ROUTINE X  2)     Status: Normal (Preliminary result)   Collection Time   03/23/12  4:45 AM      Component Value Range Status Comment   Specimen Description BLOOD LEFT HAND   Final    Special Requests BOTTLES DRAWN AEROBIC ONLY 6CC   Final    Culture  Setup Time 03/23/2012 11:36   Final    Culture     Final    Value:        BLOOD CULTURE RECEIVED NO GROWTH TO DATE CULTURE WILL BE HELD FOR 5 DAYS BEFORE ISSUING A FINAL NEGATIVE REPORT   Report Status PENDING   Incomplete   MRSA PCR SCREENING     Status: Normal   Collection Time   03/23/12 11:10 AM      Component Value Range Status Comment   MRSA by PCR NEGATIVE  NEGATIVE Final     Studies/Results: No results found.  Medications: Scheduled Meds:    . enoxaparin (LOVENOX) injection  30 mg Subcutaneous Q24H  . magnesium sulfate 1 - 4 g bolus IVPB  2 g Intravenous Once  . pantoprazole (PROTONIX) IV   40 mg Intravenous Q1200  . potassium chloride  10 mEq Intravenous Q1 Hr x 5  . tiotropium  18 mcg Inhalation Daily   Continuous Infusions:    . sodium chloride 75 mL/hr at 03/24/12 1606   PRN Meds:.albuterol, morphine injection, ondansetron (ZOFRAN) IV, ondansetron  Assessment/Plan:  Principal Problem:  *Partial small bowel obstruction Active Problems:  Nausea and vomiting  Dehydration  Hypokalemia  Leukocytosis  COPD (chronic obstructive pulmonary disease)  Hypertension  Acid reflux  ARF (acute renal failure)   PSBO -NGT clamp for the last 18 hours -No nausea or vomiting -tolerated CLD -Will get KUB and if dilatation of her bowels resolved will d/c NGT and advance diet  ARF -2/2 prerenal azotemia with GI losses. -Resolved with IVF.  Hypokalemia and hypomagnesemia -2/2 GI losses and NG suction. -K is 3.6 today. -MG 1.2; will replete.  Chronic Leukocytosis -Followed by Dr. Clelia Croft (heme/onc)  -May have early myeloproliferative disorder.  -WBC's down to baseline for her; probably demargination/stress  Due to acute dehydration -Only observation at this point.  GERD  -Protonix IV given NPO state.   COPD  -Stable.  -Continue inhalers.  DVT Prophylaxis  -Continue Lovenox.  -Patient noted to be on pradaxa according to med rec; spoke with PCP and he will search on her records indication for this drug. If not needed will discontinue it; otherwise will resume when tolerating full diet.   Time spent coordinating care: 30 minutes.   LOS: 2 days   Cachet Mccutchen [facility] Pager: [phone] 03/25/2012, 10:36 AM

## 2012-03-26 LAB — BASIC METABOLIC PANEL
BUN: 12 mg/dL (ref 6–23)
Chloride: 100 mEq/L (ref 96–112)
GFR calc Af Amer: 83 mL/min — ABNORMAL LOW (ref >90)
GFR calc non Af Amer: 71 mL/min — ABNORMAL LOW (ref >90)
Glucose, Bld: 100 mg/dL — ABNORMAL HIGH (ref 70–99)
Potassium: 3.5 mEq/L (ref 3.5–5.1)
Sodium: 139 mEq/L (ref 135–145)

## 2012-03-26 LAB — GLUCOSE, CAPILLARY
Glucose-Capillary: 116 mg/dL — ABNORMAL HIGH (ref 70–99)
Glucose-Capillary: 124 mg/dL — ABNORMAL HIGH (ref 70–99)

## 2012-03-26 NOTE — Progress Notes (Addendum)
[facility]             Progress Note   Subjective: Patient overnight with episode of bloating and full sensation in her belly after full liquid diet; also with mild nausea but no vomiting. Diet was changed back to clear liquids. Patient remains afebrile and her abdominal x-ray is demonstrating no obstructions at this point. Will advance her diet again, NG tube has been removed; if if no further GI symptoms  After tolerating heart healthy diet we'll discharge back to facility in the morning.   Objective: Vital signs in last 24 hours: Temp:  [98.1 F (36.7 C)-98.7 F (37.1 C)] 98.1 F (36.7 C) (09/24 1413) Pulse Rate:  [98-104] 103  (09/24 1413) Resp:  [15-18] 17  (09/24 1413) BP: (116-146)/(65-82) 116/69 mmHg (09/24 1413) SpO2:  [96 %-98 %] 96 % (09/24 1413) Weight:  [31.4 kg (69 lb 3.6 oz)] 31.4 kg (69 lb 3.6 oz) (09/23 2008) Weight change: 0.465 kg (1 lb 0.4 oz) Last BM Date: 03/25/12  Intake/Output from previous day: 09/23 0701 - 09/24 0700 In: 1470.8 [P.O.:720; I.V.:750.8] Out: 425 [Urine:425] Total I/O In: 90 [P.O.:90] Out: 0    Physical Exam: General: Alert, awake, oriented x3, in no acute distress. HEENT: No bruits, no goiter, NG in place. Heart: Regular rate and rhythm, without murmurs, rubs, gallops. Lungs: Clear to auscultation bilaterally. Abdomen: Soft, nontender, nondistended, positive bowel sounds. Extremities: No clubbing cyanosis or edema with positive pedal pulses. Neuro: Grossly intact, nonfocal.  Lab Results: Basic Metabolic Panel:  Basename 03/26/12 0645 03/25/12 0628 03/24/12 1019  NA 139 136 --  K 3.5 3.6 --  CL 100 98 --  CO2 27 26 --  GLUCOSE 100* 79 --  BUN 12 18 --  CREATININE 0.70 0.70 --  CALCIUM 8.3* 8.6 --  MG -- -- 1.2*  PHOS -- -- --   CBC:  Basename 03/25/12 0628 03/24/12 0520  WBC 16.1* 20.0*  NEUTROABS -- --  HGB 12.1 12.9  HCT 36.1 37.7  MCV 90.7 91.7  PLT 354 386   CBG:  Basename 03/26/12 1149  03/26/12 0709 03/26/12 0406 03/26/12 0054 03/25/12 2013 03/25/12 1728  GLUCAP 124* 98 108* 116* 107* 128*     Recent Results (from the past 240 hour(s))  CULTURE, BLOOD (ROUTINE X 2)     Status: Normal (Preliminary result)   Collection Time   03/23/12  4:30 AM      Component Value Range Status Comment   Specimen Description BLOOD LEFT ARM   Final    Special Requests BOTTLES DRAWN AEROBIC ONLY 10CC   Final    Culture  Setup Time 03/23/2012 11:36   Final    Culture     Final    Value:        BLOOD CULTURE RECEIVED NO GROWTH TO DATE CULTURE WILL BE HELD FOR 5 DAYS BEFORE ISSUING A FINAL NEGATIVE REPORT   Report Status PENDING   Incomplete   CULTURE, BLOOD (ROUTINE X 2)     Status: Normal (Preliminary result)   Collection Time   03/23/12  4:45 AM      Component Value Range Status Comment   Specimen Description BLOOD LEFT HAND   Final    Special Requests BOTTLES DRAWN AEROBIC ONLY [facility]   Final    Culture  Setup Time 03/23/2012 11:36   Final    Culture     Final    Value:        BLOOD CULTURE RECEIVED  NO GROWTH TO DATE CULTURE WILL BE HELD FOR 5 DAYS BEFORE ISSUING A FINAL NEGATIVE REPORT   Report Status PENDING   Incomplete   MRSA PCR SCREENING     Status: Normal   Collection Time   03/23/12 11:10 AM      Component Value Range Status Comment   MRSA by PCR NEGATIVE  NEGATIVE Final     Studies/Results: Dg Abd 1 View  03/25/2012  *RADIOLOGY REPORT*  Clinical Data: Abdominal tenderness  ABDOMEN - 1 VIEW  Comparison: 03/23/2012 CT  Findings: NG tube tip projects over the descending portion of the duodenum.  Bowel gas pattern nonobstructive.  Calcific densities project over the pancreatic head.  Lung bases are clear.  Surgical hardware bilateral hips.  No acute osseous finding.  IMPRESSION: Bowel gas pattern nonobstructive.  NG tube tip projects over the duodenum.   Original Report Authenticated By: Waneta Martins, M.D.     Medications: Scheduled Meds:    . enoxaparin (LOVENOX)  injection  30 mg Subcutaneous Q24H  . feeding supplement  1 Container Oral BID BM  . pantoprazole (PROTONIX) IV  40 mg Intravenous Q1200  . tiotropium  18 mcg Inhalation Daily   Continuous Infusions:    . sodium chloride 20 mL/hr (03/25/12 1246)   PRN Meds:.albuterol, morphine injection, ondansetron (ZOFRAN) IV, ondansetron  Assessment/Plan:  Principal Problem:  *Partial small bowel obstruction Active Problems:  Nausea and vomiting  Dehydration  Hypokalemia  Leukocytosis  COPD (chronic obstructive pulmonary disease)  Hypertension  Acid reflux  ARF (acute renal failure)   PSBO -NGT removed on 03/26/12 -No further nausea or vomiting -tolerated CLD; KUB without obstruction. -Will advance a heart healthy diet and if tolerated discharge in the morning back to [facility].  ARF -2/2 prerenal azotemia with GI losses. -Resolved with IVF. -Creatinine within normal limits.  Hypokalemia and hypomagnesemia -2/2 GI losses and NG suction. -Repleted.  Chronic Leukocytosis -Followed by Dr. Clelia Croft (heme/onc)  -May have early myeloproliferative disorder.  -WBC's down to baseline for her; probably demargination/stress  Due to acute dehydration -Only observation at this point; CBC in the morning.  GERD  -Continue Protonix.   COPD  -Stable.  -Continue inhalers.  DVT Prophylaxis  -Continue Lovenox.  -Patient noted to be on pradaxa according to med rec; spoke with PCP and the reason for upper back that is secondary to paroxysmal atrial fibrillation. Based on the patient's weight and age the appropriate dose will be 75 mg by mouth twice a day. Will change medications and will allow primary care physician to review and decide on long-term treatment.  Severe protein calorie malnutrition -Continue feeding supplement -encourage PO intake when tolerating diet  Time spent coordinating care: 30 minutes.   LOS: 3 days   Gabbi Whetstone [facility] Pager:  [phone] 03/26/2012, 3:18 PM

## 2012-03-26 NOTE — Progress Notes (Signed)
Utilization review completed.  

## 2012-03-26 NOTE — Progress Notes (Signed)
Shift event:  78 yo female here for partial SBO. RN called NP 2/2 pt feeling bloated and constipated. Was nauseated earlier in shift. Had full liq dinner tonight for first time. KUB today showed no obstruction.  Given sx, will downgrade pt back to clears for now and see how she does. If worse, may need to unclamp NGT. She had a BM 9/23.  Maren Reamer, NP [facility]

## 2012-03-27 ENCOUNTER — Inpatient Hospital Stay (HOSPITAL_COMMUNITY): Payer: Medicare Other

## 2012-03-27 DIAGNOSIS — F411 Generalized anxiety disorder: Secondary | ICD-10-CM

## 2012-03-27 LAB — GLUCOSE, CAPILLARY
Glucose-Capillary: 94 mg/dL (ref 70–99)
Glucose-Capillary: 97 mg/dL (ref 70–99)
Glucose-Capillary: 149 mg/dL — ABNORMAL HIGH (ref 70–99)

## 2012-03-27 LAB — BASIC METABOLIC PANEL
BUN: 15 mg/dL (ref 6–23)
Calcium: 8.6 mg/dL (ref 8.4–10.5)
Creatinine, Ser: 0.71 mg/dL (ref 0.50–1.10)
GFR calc Af Amer: 82 mL/min — ABNORMAL LOW (ref >90)
GFR calc non Af Amer: 71 mL/min — ABNORMAL LOW (ref >90)

## 2012-03-27 LAB — CBC
HCT: 40.3 % (ref 36.0–46.0)
MCH: 30.9 pg (ref 26.0–34.0)
MCHC: 34.7 g/dL (ref 30.0–36.0)
MCV: 89 fL (ref 78.0–100.0)
RDW: 12.1 % (ref 11.5–15.5)

## 2012-03-27 MED ORDER — RANITIDINE HCL 150 MG/10ML PO SYRP
150.0000 mg | ORAL_SOLUTION | Freq: Two times a day (BID) | ORAL | Status: DC | PRN
Start: 2012-03-27 — End: 2012-04-11
  Filled 2012-03-27: qty 10

## 2012-03-27 MED ORDER — SIMETHICONE 80 MG PO CHEW
160.0000 mg | CHEWABLE_TABLET | Freq: Four times a day (QID) | ORAL | Status: DC | PRN
  Administered 2012-04-01: 160 mg via ORAL
  Filled 2012-03-27 (×3): qty 2

## 2012-03-27 MED ORDER — PROMETHAZINE HCL 25 MG/ML IJ SOLN: 12.5000 mg | Freq: Four times a day (QID) | INTRAMUSCULAR | Status: DC | PRN

## 2012-03-27 NOTE — Progress Notes (Signed)
Pharmacy Communication Regarding Pradaxa  I was asked by Dr. Jomarie Longs to document our conversation from earlier this morning regarding resuming Pradaxa in this patient.  It is not felt that this patient is a pradaxa candidate given her advanced age (78 years old), low body weight (31.4 kg), and reduced renal function (estimated CrCl~16 ml/min).   Increased bleeding (hemorrhage, hemorrhagic stroke) have been noted in patient's >73 years old and in patients with low body weight and reduced renal function. Per the package insert, it is recommended to use Pradaxa cautiously at CrCl<30 ml/min at a reduced dose of 75 mg bid and it is contraindicated to continue Pradaxa if CrCl<15 ml/min. However, ACCP recommends not using Pradaxa at all in CrCl<30 ml/min.   This patient matches all of the stated criteria and therefore would be considered extremely high risk to continue on pradaxa. Risks vs. benefits need to be weighed carefully prior to resuming pradaxa in this patient. It is felt to be in the patient's best interest to not continue pradaxa. Consider consulting cardiology to get their opinion on anticoagulation or consider having the patient follow up with their outpatient cardiologist upon discharge.   Thanks! Georgina Pillion, PharmD, BCPS 03/27/2012 4:19 PM

## 2012-03-27 NOTE — Progress Notes (Signed)
Pt. vomited approximately . of colored bile vomitus with some food particles, felt better after and denies any pain, abdomen softly distended with bowel sound. BP stable but remains tachycardic, HR-100's-115's. Leighton Parody made aware of pts. symptoms with orders made.

## 2012-03-27 NOTE — Progress Notes (Signed)
[facility]             Progress Note   Subjective: Nausea and vomiting this am  Objective: Vital signs in last 24 hours: Temp:  [97.8 F (36.6 C)-98.4 F (36.9 C)] 97.8 F (36.6 C) (09/25 0410) Pulse Rate:  [103-113] 113  (09/25 0410) Resp:  [16-18] 18  (09/25 0410) BP: (116-128)/(68-79) 123/68 mmHg (09/25 0410) SpO2:  [96 %-98 %] 97 % (09/25 0410) Weight:  [31.4 kg (69 lb 3.6 oz)] 31.4 kg (69 lb 3.6 oz) (09/24 1958) Weight change: 0 kg (0 lb) Last BM Date: 03/25/12  Intake/Output from previous day: 09/24 0701 - 09/25 0700 In: 586 [P.O.:90; I.V.:486; IV Piggyback:10] Out: 200 [Urine:200]     Physical Exam: General: Alert, awake, oriented x3, in no acute distress. HEENT: No bruits, no goiter, NG in place. Heart: Regular rate and rhythm, without murmurs, rubs, gallops. Lungs: Clear to auscultation bilaterally. Abdomen: Soft, nontender, slightly distended, positive bowel sounds. Extremities: No clubbing cyanosis or edema with positive pedal pulses. Neuro: Grossly intact, nonfocal.  Lab Results: Basic Metabolic Panel:  Basename 03/26/12 0645 03/25/12 0628 03/24/12 1019  NA 139 136 --  K 3.5 3.6 --  CL 100 98 --  CO2 27 26 --  GLUCOSE 100* 79 --  BUN 12 18 --  CREATININE 0.70 0.70 --  CALCIUM 8.3* 8.6 --  MG -- -- 1.2*  PHOS -- -- --   CBC:  Basename 03/27/12 0730 03/25/12 0628  WBC 18.0* 16.1*  NEUTROABS -- --  HGB 14.0 12.1  HCT 40.3 36.1  MCV 89.0 90.7  PLT 415* 354   CBG:  Basename 03/27/12 0416 03/27/12 0023 03/26/12 2002 03/26/12 1634 03/26/12 1149 03/26/12 0709  GLUCAP 121* 157* 143* 101* 124* 98     Recent Results (from the past 240 hour(s))  CULTURE, BLOOD (ROUTINE X 2)     Status: Normal (Preliminary result)   Collection Time   03/23/12  4:30 AM      Component Value Range Status Comment   Specimen Description BLOOD LEFT ARM   Final    Special Requests BOTTLES DRAWN AEROBIC ONLY 10CC   Final    Culture  Setup Time  03/23/2012 11:36   Final    Culture     Final    Value:        BLOOD CULTURE RECEIVED NO GROWTH TO DATE CULTURE WILL BE HELD FOR 5 DAYS BEFORE ISSUING A FINAL NEGATIVE REPORT   Report Status PENDING   Incomplete   CULTURE, BLOOD (ROUTINE X 2)     Status: Normal (Preliminary result)   Collection Time   03/23/12  4:45 AM      Component Value Range Status Comment   Specimen Description BLOOD LEFT HAND   Final    Special Requests BOTTLES DRAWN AEROBIC ONLY [facility]   Final    Culture  Setup Time 03/23/2012 11:36   Final    Culture     Final    Value:        BLOOD CULTURE RECEIVED NO GROWTH TO DATE CULTURE WILL BE HELD FOR 5 DAYS BEFORE ISSUING A FINAL NEGATIVE REPORT   Report Status PENDING   Incomplete   MRSA PCR SCREENING     Status: Normal   Collection Time   03/23/12 11:10 AM      Component Value Range Status Comment   MRSA by PCR NEGATIVE  NEGATIVE Final     Studies/Results: Dg Abd 1 View  03/25/2012  *  RADIOLOGY REPORT*  Clinical Data: Abdominal tenderness  ABDOMEN - 1 VIEW  Comparison: 03/23/2012 CT  Findings: NG tube tip projects over the descending portion of the duodenum.  Bowel gas pattern nonobstructive.  Calcific densities project over the pancreatic head.  Lung bases are clear.  Surgical hardware bilateral hips.  No acute osseous finding.  IMPRESSION: Bowel gas pattern nonobstructive.  NG tube tip projects over the duodenum.   Original Report Authenticated By: Waneta Martins, M.D.     Medications: Scheduled Meds:    . enoxaparin (LOVENOX) injection  30 mg Subcutaneous Q24H  . feeding supplement  1 Container Oral BID BM  . pantoprazole (PROTONIX) IV  40 mg Intravenous Q1200  . tiotropium  18 mcg Inhalation Daily   Continuous Infusions:    . sodium chloride 50 mL/hr at 03/27/12 0508   PRN Meds:.albuterol, morphine injection, ondansetron (ZOFRAN) IV, ondansetron, promethazine, ranitidine, simethicone  Assessment/Plan:  Principal Problem:  *Partial small bowel  obstruction Active Problems:  Nausea and vomiting  Dehydration  Hypokalemia  Leukocytosis  COPD (chronic obstructive pulmonary disease)  Hypertension  Acid reflux  ARF (acute renal failure)   PSBO -NGT removed on 03/26/12 --tolerated CLD; KUB without obstruction. -diet was advanced to regular yesterday, but with vomiting and nausea since, currently NPO again, await KUB before starting any diet  ARF -2/2 prerenal azotemia with GI losses. -Resolved with IVF. -Creatinine within normal limits.  Hypokalemia and hypomagnesemia -2/2 GI losses and NG suction. -Repleted.  Chronic Leukocytosis -Followed by Dr. Clelia Croft (heme/onc)  -May have early myeloproliferative disorder.  -WBC's down to baseline for her; probably demargination/stress  Due to acute dehydration -Only observation at this point; CBC in the morning.  GERD  -Continue Protonix.   COPD  -Stable.  -Continue inhalers.  DVT Prophylaxis  -Continue Lovenox.   P afib: resume pradaxa at DC - Dispo: back to ALF when tolerating diet, not today     LOS: 4 days   [facility] Pager: [phone] 03/27/2012, 8:14 AM

## 2012-03-27 NOTE — Progress Notes (Signed)
Pt. complained of mid abd. pain earlier, given morphine 0.5mg . IV with no relief. Pain now more describe as gas pain, abdomen soft, + bowel sound. Donnamarie Poag NP made aware with orders made. Felt seek to her stomach now, refuse the po meds given zofran 4mg . IV. Will cont. to monitor.

## 2012-03-27 NOTE — Progress Notes (Signed)
Patient had one episode of emesis this am after repositioning the patient in the bed. 4mg  zofran given IV with relief. Pt states abd pain improved after episode. Small amount of emesis (50 ml), thick, mucous like, brown in color.

## 2012-03-27 NOTE — Clinical Social Work Note (Signed)
Patient was to discharge back to [facility] on 9/24 and discharge later cancelled by MD due to patient's diet  needing to be progressed. Facility contacted to advise of change in discharge plans. CSW will continue to monitor patient's progress and facilitate discharge to ALF when patient medically stable.  Genelle Bal, MSW, LCSW [phone]

## 2012-03-28 ENCOUNTER — Inpatient Hospital Stay (HOSPITAL_COMMUNITY): Payer: Medicare Other

## 2012-03-28 LAB — GLUCOSE, CAPILLARY
Glucose-Capillary: 90 mg/dL (ref 70–99)
Glucose-Capillary: 82 mg/dL (ref 70–99)

## 2012-03-28 MED ORDER — POLYETHYLENE GLYCOL 3350 17 G PO PACK
17.0000 g | PACK | Freq: Every day | ORAL | Status: DC
  Administered 2012-03-28: 17 g via ORAL
  Filled 2012-03-28 (×3): qty 1

## 2012-03-28 NOTE — Progress Notes (Signed)
Pt c/o of a pain in upper abdomen when she eats. States it eases off when she stops eating but comes back when she eats again.

## 2012-03-28 NOTE — Progress Notes (Signed)
[facility]             Progress Note   Subjective: DOing well, tolerating clears  Objective: Vital signs in last 24 hours: Temp:  [97.9 F (36.6 C)-98.6 F (37 C)] 98.3 F (36.8 C) (09/26 0442) Pulse Rate:  [98-106] 98  (09/26 0442) Resp:  [16-18] 18  (09/26 0442) BP: (87-129)/(54-87) 125/85 mmHg (09/26 0442) SpO2:  [96 %-97 %] 97 % (09/26 0442) Weight:  [32 kg (70 lb 8.8 oz)] 32 kg (70 lb 8.8 oz) (09/25 2108) Weight change: 0.6 kg (1 lb 5.2 oz) Last BM Date: 03/25/12  Intake/Output from previous day: 09/25 0701 - 09/26 0700 In: 240 [P.O.:240] Out: 500 [Urine:500]     Physical Exam: General: Alert, awake, oriented x3, in no acute distress. HEENT: No bruits, no goiter, NG in place. Heart: Regular rate and rhythm, without murmurs, rubs, gallops. Lungs: Clear to auscultation bilaterally. Abdomen: Soft, nontender, slightly distended, positive bowel sounds. Extremities: No clubbing cyanosis or edema with positive pedal pulses. Neuro: Grossly intact, nonfocal.  Lab Results: Basic Metabolic Panel:  Basename 03/27/12 0730 03/26/12 0645  NA 139 139  K 3.6 3.5  CL 99 100  CO2 25 27  GLUCOSE 135* 100*  BUN 15 12  CREATININE 0.71 0.70  CALCIUM 8.6 8.3*  MG -- --  PHOS -- --   CBC:  Basename 03/27/12 0730  WBC 18.0*  NEUTROABS --  HGB 14.0  HCT 40.3  MCV 89.0  PLT 415*   CBG:  Basename 03/28/12 0737 03/28/12 0358 03/28/12 0007 03/27/12 2006 03/27/12 1726 03/27/12 1355  GLUCAP 82 90 106* 149* 97 94     Recent Results (from the past 240 hour(s))  CULTURE, BLOOD (ROUTINE X 2)     Status: Normal (Preliminary result)   Collection Time   03/23/12  4:30 AM      Component Value Range Status Comment   Specimen Description BLOOD LEFT ARM   Final    Special Requests BOTTLES DRAWN AEROBIC ONLY 10CC   Final    Culture  Setup Time 03/23/2012 11:36   Final    Culture     Final    Value:        BLOOD CULTURE RECEIVED NO GROWTH TO DATE CULTURE WILL BE  HELD FOR 5 DAYS BEFORE ISSUING A FINAL NEGATIVE REPORT   Report Status PENDING   Incomplete   CULTURE, BLOOD (ROUTINE X 2)     Status: Normal (Preliminary result)   Collection Time   03/23/12  4:45 AM      Component Value Range Status Comment   Specimen Description BLOOD LEFT HAND   Final    Special Requests BOTTLES DRAWN AEROBIC ONLY [facility]   Final    Culture  Setup Time 03/23/2012 11:36   Final    Culture     Final    Value:        BLOOD CULTURE RECEIVED NO GROWTH TO DATE CULTURE WILL BE HELD FOR 5 DAYS BEFORE ISSUING A FINAL NEGATIVE REPORT   Report Status PENDING   Incomplete   MRSA PCR SCREENING     Status: Normal   Collection Time   03/23/12 11:10 AM      Component Value Range Status Comment   MRSA by PCR NEGATIVE  NEGATIVE Final     Studies/Results: Dg Abd Portable 1v  03/27/2012  *RADIOLOGY REPORT*  Clinical Data: Follow up small bowel obstruction  PORTABLE ABDOMEN - 1 VIEW  Comparison: 03/25/2012  Findings:  Again noted are dilated loops of small bowel.  This is not significantly changed when compared with previous exam.  The nasogastric tube has been removed.  IMPRESSION:  1.  No change in small bowel obstruction pattern.   Original Report Authenticated By: Rosealee Albee, M.D.     Medications: Scheduled Meds:    . enoxaparin (LOVENOX) injection  30 mg Subcutaneous Q24H  . feeding supplement  1 Container Oral BID BM  . pantoprazole (PROTONIX) IV  40 mg Intravenous Q1200  . tiotropium  18 mcg Inhalation Daily   Continuous Infusions:    . sodium chloride 50 mL/hr (03/28/12 0110)   PRN Meds:.albuterol, morphine injection, ondansetron (ZOFRAN) IV, ondansetron, promethazine, ranitidine, simethicone  Assessment/Plan:  Principal Problem:  *Partial small bowel obstruction Active Problems:  Nausea and vomiting  Dehydration  Hypokalemia  Leukocytosis  COPD (chronic obstructive pulmonary disease)  Hypertension  Acid reflux  ARF (acute renal failure)   PSBO -NGT  removed on 03/26/12 --tolerated CLD; KUB without obstruction. -diet was advanced to regular yesterday, but with vomiting and nausea since, currently NPO again, await KUB before starting any diet -tolerated clears again 9/25, will advance to full liq today  ARF -2/2 prerenal azotemia with GI losses. -Resolved with IVF. -Creatinine within normal limits.  Hypokalemia and hypomagnesemia -2/2 GI losses and NG suction. -Repleted.  Chronic Leukocytosis -Followed by Dr. Clelia Croft (heme/onc)  -May have early myeloproliferative disorder.  -WBC's down to baseline for her; probably demargination/stress  Due to acute dehydration -Only observation at this point; CBC in the morning.  GERD  -Continue Protonix.   COPD  -Stable.  -Continue inhalers.  DVT Prophylaxis  -Continue Lovenox.   P afib: will not resume pradaxa at DC due to age, GFR - Dispo: back to ALF when tolerating regular diet, hopefully 9/26     LOS: 5 days   [facility] Pager: [phone] 03/28/2012, 8:11 AM

## 2012-03-29 ENCOUNTER — Inpatient Hospital Stay (HOSPITAL_COMMUNITY): Payer: Medicare Other

## 2012-03-29 DIAGNOSIS — K56609 Unspecified intestinal obstruction, unspecified as to partial versus complete obstruction: Secondary | ICD-10-CM

## 2012-03-29 LAB — CULTURE, BLOOD (ROUTINE X 2)

## 2012-03-29 LAB — BASIC METABOLIC PANEL
BUN: 10 mg/dL (ref 6–23)
CO2: 25 mEq/L (ref 19–32)
Calcium: 8.3 mg/dL — ABNORMAL LOW (ref 8.4–10.5)
GFR calc non Af Amer: 78 mL/min — ABNORMAL LOW (ref >90)
Glucose, Bld: 136 mg/dL — ABNORMAL HIGH (ref 70–99)
Sodium: 138 mEq/L (ref 135–145)

## 2012-03-29 MED ORDER — BISACODYL 10 MG RE SUPP
10.0000 mg | Freq: Once | RECTAL | Status: AC
  Administered 2012-03-29: 10 mg via RECTAL
  Filled 2012-03-29: qty 1

## 2012-03-29 NOTE — Consult Note (Signed)
Reason for Consult: PSBO Referring Physician: Dr. Bjorn Loser Bianca Khan is an 78 y.o. female.  HPI: 78 yr old year old female who presented to [facility] 6 days ago with vomiting, nausea and abdominal pain that had been on going for 3 days.  CT evaluation showed a SBO.  A NGt was initially placed.  She was put on bowel rest.  The NGT has been removed and the patient had her diet advanced but was unable to tolerate it so it was decreased to liquids.  Her films have showed little change over the past several days.  Her abdominal pain and nausea has improved over the last 2 days and she had a Bm last night and this AM.  She did have a supp at 0030 this am.  She denies abd pain, nausea or vomiting today.    Past Medical History  Diagnosis Date  . COPD (chronic obstructive pulmonary disease)   . CHF (congestive heart failure)   . Edema   . Myocardial infarction   . Anxiety   . Hypertension   . Anemia   . Leukocytosis   . Acid reflux     Past Surgical History  Procedure Date  . Abdominal hysterectomy     History reviewed. No pertinent family history.  Social History:  reports that she has quit smoking. She does not have any smokeless tobacco history on file. Her alcohol and drug histories not on file.  Allergies: No Known Allergies  Medications: I have reviewed the patient's current medications.  Results for orders placed during the hospital encounter of 03/23/12 (from the past 48 hour(s))  GLUCOSE, CAPILLARY     Status: Normal   Collection Time   03/27/12  1:55 PM      Component Value Range Comment   Glucose-Capillary 94  70 - 99 mg/dL    Comment 1 Notify RN     GLUCOSE, CAPILLARY     Status: Normal   Collection Time   03/27/12  5:26 PM      Component Value Range Comment   Glucose-Capillary 97  70 - 99 mg/dL    Comment 1 Notify RN     GLUCOSE, CAPILLARY     Status: Abnormal   Collection Time   03/27/12  8:06 PM      Component Value Range Comment   Glucose-Capillary 149 (*) 70 - 99  mg/dL   GLUCOSE, CAPILLARY     Status: Abnormal   Collection Time   03/28/12 12:07 AM      Component Value Range Comment   Glucose-Capillary 106 (*) 70 - 99 mg/dL   GLUCOSE, CAPILLARY     Status: Normal   Collection Time   03/28/12  3:58 AM      Component Value Range Comment   Glucose-Capillary 90  70 - 99 mg/dL   GLUCOSE, CAPILLARY     Status: Normal   Collection Time   03/28/12  7:37 AM      Component Value Range Comment   Glucose-Capillary 82  70 - 99 mg/dL   GLUCOSE, CAPILLARY     Status: Abnormal   Collection Time   03/28/12 12:15 PM      Component Value Range Comment   Glucose-Capillary 162 (*) 70 - 99 mg/dL   GLUCOSE, CAPILLARY     Status: Abnormal   Collection Time   03/28/12  3:55 PM      Component Value Range Comment   Glucose-Capillary 135 (*) 70 - 99 mg/dL  GLUCOSE, CAPILLARY     Status: Abnormal   Collection Time   03/28/12  8:09 PM      Component Value Range Comment   Glucose-Capillary 116 (*) 70 - 99 mg/dL   BASIC METABOLIC PANEL     Status: Abnormal   Collection Time   03/29/12  6:30 AM      Component Value Range Comment   Sodium 138  135 - 145 mEq/L    Potassium 3.1 (*) 3.5 - 5.1 mEq/L    Chloride 104  96 - 112 mEq/L    CO2 25  19 - 32 mEq/L    Glucose, Bld 136 (*) 70 - 99 mg/dL    BUN 10  6 - 23 mg/dL    Creatinine, Ser 1.61  0.50 - 1.10 mg/dL    Calcium 8.3 (*) 8.4 - 10.5 mg/dL    GFR calc non Af Amer 78 (*) >90 mL/min    GFR calc Af Amer >90  >90 mL/min     Dg Abd 1 View  03/29/2012  *RADIOLOGY REPORT*  Clinical Data: History of partial small-bowel obstruction.  Follow- up.  ABDOMEN - 1 VIEW  Comparison: 03/28/2012.  CT 03/23/2012.  Findings: On the supine image multiple dilated loops of small intestine are identified.  The largest has a caliber approximately 4 cm which is similar to the largest on the prior study.  There is a paucity of colonic gas.  There is a small amount of rectal gas. No opaque calculi are seen.  Pelvic phleboliths are present.  Previous hip ORIF has been performed with hardware in the proximal aspect of each femur without evidence of disruption.  IMPRESSION: Continued abnormal small bowel gas pattern with dilatation on supine image.  A paucity of colonic gas present. Air-fluid levels cannot be evaluated without erect or decubitus examination. Pattern is consistent on supine image with distal partial small- bowel obstruction without significant change.   Original Report Authenticated By: Crawford Givens, M.D.    Dg Abd 1 View  03/28/2012  *RADIOLOGY REPORT*  Clinical Data: Partial small bowel obstruction.  ABDOMEN - 1 VIEW  Comparison: 03/27/2012  Findings: Multiple moderately dilated small bowel loops are seen throughout the abdomen and pelvis without significant change. There remains a paucity of colonic gas, and this is consistent with a distal small bowel obstruction.  IMPRESSION: Bowel gas pattern consistent with distal small bowel obstruction, without significant change since prior.   Original Report Authenticated By: Danae Orleans, M.D.     Review of Systems  Constitutional: Negative.   HENT: Negative.   Eyes: Negative.   Respiratory: Negative.   Cardiovascular: Negative.   Gastrointestinal: Positive for nausea (on admission), vomiting (on admission) and abdominal pain (on admission).  Genitourinary: Negative.   Musculoskeletal: Negative.   Skin: Negative.   Neurological: Negative.   Endo/Heme/Allergies: Negative.   Psychiatric/Behavioral: Negative.    Blood pressure 100/51, pulse 97, temperature 97.9 F (36.6 C), temperature source Oral, resp. rate 18, height 5' 3.5" (1.613 m), weight 72 lb 12 oz (33 kg), SpO2 97.00%. Physical Exam  Constitutional: She appears well-developed and well-nourished. No distress.  HENT:  Head: Normocephalic and atraumatic.  Eyes: Conjunctivae normal are normal. Pupils are equal, round, and reactive to light.  Neck: Normal range of motion. Neck supple.  Cardiovascular: Normal rate  and regular rhythm.   Respiratory: Effort normal and breath sounds normal.  GI: She exhibits distension (mildly). There is no tenderness.       Bowel sounds  present  Musculoskeletal: She exhibits no edema and no tenderness.  Neurological: She is alert.  Skin: Skin is warm and dry.  Psychiatric: She has a normal mood and affect. Her behavior is normal.    Assessment/Plan: 1.  PSBO: her films from today are reviewed which are showing persistent PSBO.  She is having BMs and her symptoms have resolved however her films are not improving much.  Recommend going back to bowel rest for the next 24 hours and recheck her films in the AM.  If improving then advance diet, if worsening or persistent then should consider a small bowel follow through to better define her obstruction.  If surgical intervention becomes needed she will need cardiac clearance and medical clearance.  Will follow   Ariam Mol 03/29/2012, 11:22 AM

## 2012-03-29 NOTE — Clinical Social Work Note (Signed)
CSW continuing to monitor patient progress and she is not yet medically stable for discharge. CSW will facilitate discharge back to [facility] when medically stable.  Genelle Bal, MSW, LCSW [phone]

## 2012-03-29 NOTE — Progress Notes (Signed)
Nutrition Follow-up  Intervention:   1.  Modify diet; per MD discretion.  Pt previously tolerating full liquids, NPO today for imaging. 2.  Nutrition support; if pt unable to maintain full liquid diet or fails to advance diet in 48 hrs, recommend consideration of nutrition support.  Pt does not like available supplements and is therefore diet likely remains nutritionally incomplete at this time.  Assessment:   Pt with persistent SBO with little improvement. Pt has not been able to tolerate diet advancements and is currently on full liquids.  Pt has been refusing [employer].  Diet Order:  Full liquids, Resource Breeze BID  Meds: Scheduled Meds:   . bisacodyl  10 mg Rectal Once  . enoxaparin (LOVENOX) injection  30 mg Subcutaneous Q24H  . feeding supplement  1 Container Oral BID BM  . pantoprazole (PROTONIX) IV  40 mg Intravenous Q1200  . tiotropium  18 mcg Inhalation Daily  . DISCONTD: polyethylene glycol  17 g Oral Daily   Continuous Infusions:   . sodium chloride 50 mL/hr (03/28/12 0110)   PRN Meds:.albuterol, morphine injection, ondansetron (ZOFRAN) IV, ondansetron, promethazine, ranitidine, simethicone  Labs:  CMP     Component Value Date/Time   NA 138 03/29/2012 0630   K 3.1* 03/29/2012 0630   CL 104 03/29/2012 0630   CO2 25 03/29/2012 0630   GLUCOSE 136* 03/29/2012 0630   BUN 10 03/29/2012 0630   CREATININE 0.54 03/29/2012 0630   CALCIUM 8.3* 03/29/2012 0630   PROT 7.1 03/23/2012 0441   ALBUMIN 3.9 03/23/2012 0441   AST 17 03/23/2012 0441   ALT 8 03/23/2012 0441   ALKPHOS 77 03/23/2012 0441   BILITOT 0.6 03/23/2012 0441   GFRNONAA 78* 03/29/2012 0630   GFRAA >90 03/29/2012 0630   Sodium  Date/Time Value Range Status  03/29/2012  6:30 AM 138  135 - 145 mEq/L Final  03/27/2012  7:30 AM 139  135 - 145 mEq/L Final  03/26/2012  6:45 AM 139  135 - 145 mEq/L Final    Potassium  Date/Time Value Range Status  03/29/2012  6:30 AM 3.1* 3.5 - 5.1 mEq/L Final  03/27/2012  7:30 AM 3.6   3.5 - 5.1 mEq/L Final  03/26/2012  6:45 AM 3.5  3.5 - 5.1 mEq/L Final    Phosphorus  Date/Time Value Range Status  04/17/2010  9:50 PM 3.5  2.3 - 4.6 mg/dL Final  07/08/1094  0:45 AM 2.5  2.3 - 4.6 mg/dL Final    Magnesium  Date/Time Value Range Status  03/24/2012 10:19 AM 1.2* 1.5 - 2.5 mg/dL Final  10/09/8117  1:47 AM 1.6  1.5 - 2.5 mg/dL Final  82/95/6213  0:86 AM 1.5  1.5 - 2.5 mg/dL Final    Intake/Output Summary (Last 24 hours) at 03/29/12 1333 Last data filed at 03/29/12 0654  Gross per 24 hour  Intake 1204.67 ml  Output      0 ml  Net 1204.67 ml    Weight Status:  72 lbs, increasing trend. Admission wt: 66 lbs  Restatement of needs: [phone] kcal, 30-40g protein, ~1.2 L/day  Nutrition Dx:  Inadequate oral intake, ongoing  Goal: Pt to meet >/= 90% of their estimated nutrition needs.  Not met, pt continues on liquid diet with poor supplement intake.  Monitor: weight trends, lab trends, I/O's, PO intake, supplement tolerance   Loyce Dys, MS RD LDN Clinical Inpatient Dietitian Pager: [phone] Weekend/After hours pager: [phone]

## 2012-03-29 NOTE — Progress Notes (Signed)
Pt arrived to the unit with dx. Syncope. Pt alert and oriented x3. Ambulatory x1 assist with cane. Pt continent of bowel and bladder. Denies any discomfort. VS stable. Will cont to monitor.

## 2012-03-29 NOTE — Progress Notes (Addendum)
[facility]             Progress Note   Subjective: Felt sick with eating yesterday, had a BM this am and feels a little better, hasnt tried any breakfast yet  Objective: Vital signs in last 24 hours: Temp:  [97.9 F (36.6 C)-99 F (37.2 C)] 97.9 F (36.6 C) (09/27 1018) Pulse Rate:  [82-108] 97  (09/27 1018) Resp:  [16-20] 18  (09/27 1018) BP: (100-137)/(51-82) 100/51 mmHg (09/27 1018) SpO2:  [95 %-98 %] 97 % (09/27 1018) Weight:  [33 kg (72 lb 12 oz)] 33 kg (72 lb 12 oz) (09/26 2100) Weight change: 1 kg (2 lb 3.3 oz) Last BM Date: 03/29/12  Intake/Output from previous day: 09/26 0701 - 09/27 0700 In: 1204.7 [I.V.:1196.7; IV Piggyback:8] Out: -      Physical Exam: General: Alert, awake, oriented x3, in no acute distress. HEENT: No bruits, no goiter, NG in place. Heart: Regular rate and rhythm, without murmurs, rubs, gallops. Lungs: Clear to auscultation bilaterally. Abdomen: Soft, nontender, slightly distended, positive bowel sounds. Extremities: No clubbing cyanosis or edema with positive pedal pulses. Neuro: Grossly intact, nonfocal.  Lab Results: Basic Metabolic Panel:  Basename 03/29/12 0630 03/27/12 0730  NA 138 139  K 3.1* 3.6  CL 104 99  CO2 25 25  GLUCOSE 136* 135*  BUN 10 15  CREATININE 0.54 0.71  CALCIUM 8.3* 8.6  MG -- --  PHOS -- --   CBC:  Basename 03/27/12 0730  WBC 18.0*  NEUTROABS --  HGB 14.0  HCT 40.3  MCV 89.0  PLT 415*   CBG:  Basename 03/28/12 2009 03/28/12 1555 03/28/12 1215 03/28/12 0737 03/28/12 0358 03/28/12 0007  GLUCAP 116* 135* 162* 82 90 106*     Recent Results (from the past 240 hour(s))  CULTURE, BLOOD (ROUTINE X 2)     Status: Normal   Collection Time   03/23/12  4:30 AM      Component Value Range Status Comment   Specimen Description BLOOD LEFT ARM   Final    Special Requests BOTTLES DRAWN AEROBIC ONLY 10CC   Final    Culture  Setup Time 03/23/2012 11:36   Final    Culture NO GROWTH 5 DAYS    Final    Report Status 03/29/2012 FINAL   Final   CULTURE, BLOOD (ROUTINE X 2)     Status: Normal   Collection Time   03/23/12  4:45 AM      Component Value Range Status Comment   Specimen Description BLOOD LEFT HAND   Final    Special Requests BOTTLES DRAWN AEROBIC ONLY [facility]   Final    Culture  Setup Time 03/23/2012 11:36   Final    Culture NO GROWTH 5 DAYS   Final    Report Status 03/29/2012 FINAL   Final   MRSA PCR SCREENING     Status: Normal   Collection Time   03/23/12 11:10 AM      Component Value Range Status Comment   MRSA by PCR NEGATIVE  NEGATIVE Final     Studies/Results: Dg Abd 1 View  03/29/2012  *RADIOLOGY REPORT*  Clinical Data: History of partial small-bowel obstruction.  Follow- up.  ABDOMEN - 1 VIEW  Comparison: 03/28/2012.  CT 03/23/2012.  Findings: On the supine image multiple dilated loops of small intestine are identified.  The largest has a caliber approximately 4 cm which is similar to the largest on the prior study.  There is a  paucity of colonic gas.  There is a small amount of rectal gas. No opaque calculi are seen.  Pelvic phleboliths are present. Previous hip ORIF has been performed with hardware in the proximal aspect of each femur without evidence of disruption.  IMPRESSION: Continued abnormal small bowel gas pattern with dilatation on supine image.  A paucity of colonic gas present. Air-fluid levels cannot be evaluated without erect or decubitus examination. Pattern is consistent on supine image with distal partial small- bowel obstruction without significant change.   Original Report Authenticated By: Crawford Givens, M.D.    Dg Abd 1 View  03/28/2012  *RADIOLOGY REPORT*  Clinical Data: Partial small bowel obstruction.  ABDOMEN - 1 VIEW  Comparison: 03/27/2012  Findings: Multiple moderately dilated small bowel loops are seen throughout the abdomen and pelvis without significant change. There remains a paucity of colonic gas, and this is consistent with a distal small  bowel obstruction.  IMPRESSION: Bowel gas pattern consistent with distal small bowel obstruction, without significant change since prior.   Original Report Authenticated By: Danae Orleans, M.D.     Medications: Scheduled Meds:    . bisacodyl  10 mg Rectal Once  . enoxaparin (LOVENOX) injection  30 mg Subcutaneous Q24H  . feeding supplement  1 Container Oral BID BM  . pantoprazole (PROTONIX) IV  40 mg Intravenous Q1200  . tiotropium  18 mcg Inhalation Daily  . DISCONTD: polyethylene glycol  17 g Oral Daily   Continuous Infusions:    . sodium chloride 50 mL/hr (03/28/12 0110)   PRN Meds:.albuterol, morphine injection, ondansetron (ZOFRAN) IV, ondansetron, promethazine, ranitidine, simethicone  Assessment/Plan:  Principal Problem:  *Partial small bowel obstruction Active Problems:  Nausea and vomiting  Dehydration  Hypokalemia  Leukocytosis  COPD (chronic obstructive pulmonary disease)  Hypertension  Acid reflux  ARF (acute renal failure)   PSBO -NGT removed on 03/26/12 - KUB without  Much improvement. -diet was advanced to regular 9/25, but with vomiting and nausea since, then made NPO again, restarted clears 9/25  -tolerated clears again 9/25, was advanced to full liq 9/26 -Pt reluctant to have NGT replaced - I will request Surgical eval, i recognize that she is not really a good surgical candidate - will try miralax again today - may need CT enterography to eval small bowel if doesn't improve  ARF -2/2 prerenal azotemia with GI losses. -Resolved with IVF. -Creatinine within normal limits.  Hypokalemia and hypomagnesemia -2/2 GI losses and NG suction. -Repleted.  Chronic Leukocytosis -Followed by Dr. Clelia Croft (heme/onc)  -May have early myeloproliferative disorder.  -WBC's down to baseline for her; probably demargination/stress  Due to acute dehydration -Only observation at this point  GERD  -Continue Protonix.   COPD  -Stable.  -Continue inhalers.  DVT  Prophylaxis  -Continue Lovenox.   P afib: will not resume pradaxa at DC due to age, GFR - Dispo: back to ALF when better  Called and updated son August Saucer again, cell: [phone]      LOS: 6 days   [facility] Pager: [phone] 03/29/2012, 10:47 AM

## 2012-03-30 ENCOUNTER — Inpatient Hospital Stay (HOSPITAL_COMMUNITY): Payer: Medicare Other

## 2012-03-30 LAB — BASIC METABOLIC PANEL
BUN: 7 mg/dL (ref 6–23)
CO2: 22 mEq/L (ref 19–32)
Calcium: 7.8 mg/dL — ABNORMAL LOW (ref 8.4–10.5)
Glucose, Bld: 74 mg/dL (ref 70–99)
Potassium: 2.9 mEq/L — ABNORMAL LOW (ref 3.5–5.1)
Sodium: 141 mEq/L (ref 135–145)

## 2012-03-30 MED ORDER — POTASSIUM CHLORIDE 10 MEQ/100ML IV SOLN
10.0000 meq | INTRAVENOUS | Status: AC
  Administered 2012-03-30: 10 meq via INTRAVENOUS
  Filled 2012-03-30: qty 100

## 2012-03-30 MED ORDER — POTASSIUM CHLORIDE 10 MEQ/100ML IV SOLN
10.0000 meq | INTRAVENOUS | Status: AC
  Administered 2012-03-30 (×4): 10 meq via INTRAVENOUS
  Filled 2012-03-30 (×5): qty 100

## 2012-03-30 NOTE — Progress Notes (Signed)
Agree with A&P of EW,PA. Patient has benign abdomen and seems to be progressing Mild hypokalemia - to be addressed by primary care

## 2012-03-30 NOTE — Progress Notes (Signed)
[facility]             Progress Note   Subjective: Doing ok, wondering when she can eat, small BM yesterday  Objective: Vital signs in last 24 hours: Temp:  [97.9 F (36.6 C)-98.2 F (36.8 C)] 98.1 F (36.7 C) (09/28 0830) Pulse Rate:  [82-101] 82  (09/28 0830) Resp:  [17-18] 17  (09/28 0522) BP: (121-147)/(53-85) 145/81 mmHg (09/28 0830) SpO2:  [94 %-99 %] 99 % (09/28 0830) Weight:  [33.1 kg (72 lb 15.6 oz)] 33.1 kg (72 lb 15.6 oz) (09/27 2104) Weight change: 0.1 kg (3.5 oz) Last BM Date: 03/29/12  Intake/Output from previous day: 09/27 0701 - 09/28 0700 In: -  Out: 50 [Urine:50]     Physical Exam: General: Alert, awake, oriented x3, in no acute distress. HEENT: No bruits, no goiter, NG in place. Heart: Regular rate and rhythm, without murmurs, rubs, gallops. Lungs: Clear to auscultation bilaterally. Abdomen: Soft, nontender, slightly distended, positive bowel sounds. Extremities: No clubbing cyanosis or edema with positive pedal pulses. Neuro: Grossly intact, nonfocal.  Lab Results: Basic Metabolic Panel:  Basename 03/30/12 0735 03/29/12 0630  NA 141 138  K 2.9* 3.1*  CL 105 104  CO2 22 25  GLUCOSE 74 136*  BUN 7 10  CREATININE 0.61 0.54  CALCIUM 7.8* 8.3*  MG -- --  PHOS -- --   CBC: No results found for this basename: WBC:2,NEUTROABS:2,HGB:2,HCT:2,MCV:2,PLT:2 in the last 72 hours CBG:  Basename 03/28/12 2009 03/28/12 1555 03/28/12 1215 03/28/12 0737 03/28/12 0358 03/28/12 0007  GLUCAP 116* 135* 162* 82 90 106*     Recent Results (from the past 240 hour(s))  CULTURE, BLOOD (ROUTINE X 2)     Status: Normal   Collection Time   03/23/12  4:30 AM      Component Value Range Status Comment   Specimen Description BLOOD LEFT ARM   Final    Special Requests BOTTLES DRAWN AEROBIC ONLY 10CC   Final    Culture  Setup Time 03/23/2012 11:36   Final    Culture NO GROWTH 5 DAYS   Final    Report Status 03/29/2012 FINAL   Final   CULTURE, BLOOD  (ROUTINE X 2)     Status: Normal   Collection Time   03/23/12  4:45 AM      Component Value Range Status Comment   Specimen Description BLOOD LEFT HAND   Final    Special Requests BOTTLES DRAWN AEROBIC ONLY [facility]   Final    Culture  Setup Time 03/23/2012 11:36   Final    Culture NO GROWTH 5 DAYS   Final    Report Status 03/29/2012 FINAL   Final   MRSA PCR SCREENING     Status: Normal   Collection Time   03/23/12 11:10 AM      Component Value Range Status Comment   MRSA by PCR NEGATIVE  NEGATIVE Final     Studies/Results: Dg Abd 1 View  03/29/2012  *RADIOLOGY REPORT*  Clinical Data: History of partial small-bowel obstruction.  Follow- up.  ABDOMEN - 1 VIEW  Comparison: 03/28/2012.  CT 03/23/2012.  Findings: On the supine image multiple dilated loops of small intestine are identified.  The largest has a caliber approximately 4 cm which is similar to the largest on the prior study.  There is a paucity of colonic gas.  There is a small amount of rectal gas. No opaque calculi are seen.  Pelvic phleboliths are present. Previous hip ORIF  has been performed with hardware in the proximal aspect of each femur without evidence of disruption.  IMPRESSION: Continued abnormal small bowel gas pattern with dilatation on supine image.  A paucity of colonic gas present. Air-fluid levels cannot be evaluated without erect or decubitus examination. Pattern is consistent on supine image with distal partial small- bowel obstruction without significant change.   Original Report Authenticated By: Crawford Givens, M.D.    Dg Abd 2 Views  03/30/2012  *RADIOLOGY REPORT*  Clinical Data: Partial small bowel obstruction.  ABDOMEN - 2 VIEW  Comparison: 1 day prior and CT of 03/23/2012.  Findings: 2 supine and one right-side up decubitus view.  The right- sided decubitus view demonstrates no free intraperitoneal air. There are numerous air-fluid levels within small bowel loops within the right lower quadrant.  Supine images demonstrate  improving small bowel dilatation. Calcifications in the right side of the abdomen are vascular or due to old granulomatous disease in nodes, when correlated with prior CT.  No pneumatosis. Distal gas and stool.  Bilateral proximal femoral fixation.  Mild osteopenia.  IMPRESSION: Improvement in the small bowel obstruction pattern.  Persistent small bowel air fluid levels, without residual small bowel dilatation.   Original Report Authenticated By: Consuello Bossier, M.D.     Medications: Scheduled Meds:    . enoxaparin (LOVENOX) injection  30 mg Subcutaneous Q24H  . feeding supplement  1 Container Oral BID BM  . pantoprazole (PROTONIX) IV  40 mg Intravenous Q1200  . potassium chloride  10 mEq Intravenous Q1 Hr x 5  . tiotropium  18 mcg Inhalation Daily   Continuous Infusions:    . sodium chloride 50 mL/hr at 03/29/12 1852   PRN Meds:.albuterol, morphine injection, ondansetron (ZOFRAN) IV, ondansetron, promethazine, ranitidine, simethicone  Assessment/Plan:  Principal Problem:  *Partial small bowel obstruction Active Problems:  Nausea and vomiting  Dehydration  Hypokalemia  Leukocytosis  COPD (chronic obstructive pulmonary disease)  Hypertension  Acid reflux  ARF (acute renal failure)   PSBO -NGT removed on 03/26/12 - KUB without  Much improvement. -diet was advanced to regular 9/25, but with vomiting and nausea since, then made NPO again, restarted clears 9/25  -tolerated clears again 9/25, was advanced to full liq 9/26 -Pt reluctant to have NGT replaced -hopefully starting to turn the corner - appreciate surgical input, started back on clears again - may need CT enterography to eval small bowel if doesn't improve  ARF -2/2 prerenal azotemia with GI losses. -Resolved with IVF. -Creatinine within normal limits.  Hypokalemia and hypomagnesemia -2/2 GI losses and NG suction. -Repleted.  Chronic Leukocytosis -Followed by Dr. Clelia Croft (heme/onc)  -May have early  myeloproliferative disorder.  -WBC's down to baseline for her; probably demargination/stress  Due to acute dehydration -Only observation at this point  GERD  -Continue Protonix.   COPD  -Stable.  -Continue inhalers.  DVT Prophylaxis  -Continue Lovenox.   P afib: will not resume pradaxa at DC due to age, GFR - Dispo: back to ALF when better  Called and updated son August Saucer again, cell: [phone]      LOS: 7 days   [facility] Pager: [phone] 03/30/2012, 2:31 PM

## 2012-03-30 NOTE — Progress Notes (Signed)
Patient ID: Bianca Khan, female   DOB: Nov 11, 1914, 78 y.o.   MRN: [id number]    Subjective: Pt reports feeling better today, less pain, denies nausea or vomting  Objective: Vital signs in last 24 hours: Temp:  [97.9 F (36.6 C)-98.2 F (36.8 C)] 98.1 F (36.7 C) (09/28 0830) Pulse Rate:  [82-101] 82  (09/28 0830) Resp:  [17-18] 17  (09/28 0522) BP: (121-147)/(53-85) 145/81 mmHg (09/28 0830) SpO2:  [94 %-99 %] 99 % (09/28 0830) Weight:  [72 lb 15.6 oz (33.1 kg)] 72 lb 15.6 oz (33.1 kg) (09/27 2104) Last BM Date: 03/29/12  Intake/Output from previous day: 09/27 0701 - 09/28 0700 In: -  Out: 50 [Urine:50] Intake/Output this shift:    PE: Abd: soft, minam[place] tender, +BS  Lab Results:  No results found for this basename: WBC:2,HGB:2,HCT:2,PLT:2 in the last 72 hours BMET  Basename 03/30/12 0735 03/29/12 0630  NA 141 138  K 2.9* 3.1*  CL 105 104  CO2 22 25  GLUCOSE 74 136*  BUN 7 10  CREATININE 0.61 0.54  CALCIUM 7.8* 8.3*   PT/INR No results found for this basename: LABPROT:2,INR:2 in the last 72 hours CMP     Component Value Date/Time   NA 141 03/30/2012 0735   K 2.9* 03/30/2012 0735   CL 105 03/30/2012 0735   CO2 22 03/30/2012 0735   GLUCOSE 74 03/30/2012 0735   BUN 7 03/30/2012 0735   CREATININE 0.61 03/30/2012 0735   CALCIUM 7.8* 03/30/2012 0735   PROT 7.1 03/23/2012 0441   ALBUMIN 3.9 03/23/2012 0441   AST 17 03/23/2012 0441   ALT 8 03/23/2012 0441   ALKPHOS 77 03/23/2012 0441   BILITOT 0.6 03/23/2012 0441   GFRNONAA 75* 03/30/2012 0735   GFRAA 87* 03/30/2012 0735   Lipase     Component Value Date/Time   LIPASE 11 03/23/2012 0441       Studies/Results: Dg Abd 1 View  03/29/2012  *RADIOLOGY REPORT*  Clinical Data: History of partial small-bowel obstruction.  Follow- up.  ABDOMEN - 1 VIEW  Comparison: 03/28/2012.  CT 03/23/2012.  Findings: On the supine image multiple dilated loops of small intestine are identified.  The largest has a caliber approximately 4 cm  which is similar to the largest on the prior study.  There is a paucity of colonic gas.  There is a small amount of rectal gas. No opaque calculi are seen.  Pelvic phleboliths are present. Previous hip ORIF has been performed with hardware in the proximal aspect of each femur without evidence of disruption.  IMPRESSION: Continued abnormal small bowel gas pattern with dilatation on supine image.  A paucity of colonic gas present. Air-fluid levels cannot be evaluated without erect or decubitus examination. Pattern is consistent on supine image with distal partial small- bowel obstruction without significant change.   Original Report Authenticated By: Crawford Givens, M.D.    Dg Abd 2 Views  03/30/2012  *RADIOLOGY REPORT*  Clinical Data: Partial small bowel obstruction.  ABDOMEN - 2 VIEW  Comparison: 1 day prior and CT of 03/23/2012.  Findings: 2 supine and one right-side up decubitus view.  The right- sided decubitus view demonstrates no free intraperitoneal air. There are numerous air-fluid levels within small bowel loops within the right lower quadrant.  Supine images demonstrate improving small bowel dilatation. Calcifications in the right side of the abdomen are vascular or due to old granulomatous disease in nodes, when correlated with prior CT.  No pneumatosis. Distal gas and stool.  Bilateral proximal femoral fixation.  Mild osteopenia.  IMPRESSION: Improvement in the small bowel obstruction pattern.  Persistent small bowel air fluid levels, without residual small bowel dilatation.   Original Report Authenticated By: Consuello Bossier, M.D.     Anti-infectives: Anti-infectives    None       Assessment/Plan  1.  PSBO: x-rays and clinically improving now, will start clears, if tolerates this today then can advance as tolerated starting tomorrow.  Would repeat films in Am.     LOS: 7 days    Bianca Khan 03/30/2012

## 2012-03-31 LAB — BASIC METABOLIC PANEL
BUN: 7 mg/dL (ref 6–23)
Chloride: 104 mEq/L (ref 96–112)
Creatinine, Ser: 0.62 mg/dL (ref 0.50–1.10)
Glucose, Bld: 52 mg/dL — ABNORMAL LOW (ref 70–99)
Potassium: 3.5 mEq/L (ref 3.5–5.1)

## 2012-03-31 NOTE — Progress Notes (Signed)
Partial small bowel obstruction  Assessment: Partial small bowel obstruction Resolving small bowel obstruction; yesterday's x-ray pattern with improved as well.  Plan: Would cautiously advance diet. We will see again on an as-needed basis.   Subjective: She feels better today. She is having no nausea. She's having no abdominal pain. She tolerated a liquid diet yesterday. She is passing gas and had a small bowel movement.  Objective: Vital signs in last 24 hours: Temp:  [98.1 F (36.7 C)-98.7 F (37.1 C)] 98.7 F (37.1 C) (09/29 0509) Pulse Rate:  [78-82] 79  (09/29 0509) Resp:  [16-18] 18  (09/29 0509) BP: (139-145)/(75-81) 139/81 mmHg (09/29 0509) SpO2:  [96 %-99 %] 96 % (09/29 0509) Last BM Date: 03/30/12  Intake/Output from previous day: 09/28 0701 - 09/29 0700 In: 2855 [I.V.:2355; IV Piggyback:500] Out: 750 [Urine:750] Intake/Output this shift:    General appearance: alert, cooperative, appears stated age and no distress Resp: clear to auscultation bilaterally GI: soft, non-tender; bowel sounds normal; no masses,  no organomegaly  Lab Results:  Results for orders placed during the hospital encounter of 03/23/12 (from the past 24 hour(s))  BASIC METABOLIC PANEL     Status: Abnormal   Collection Time   03/30/12  7:35 AM      Component Value Range   Sodium 141  135 - 145 mEq/L   Potassium 2.9 (*) 3.5 - 5.1 mEq/L   Chloride 105  96 - 112 mEq/L   CO2 22  19 - 32 mEq/L   Glucose, Bld 74  70 - 99 mg/dL   BUN 7  6 - 23 mg/dL   Creatinine, Ser 1.61  0.50 - 1.10 mg/dL   Calcium 7.8 (*) 8.4 - 10.5 mg/dL   GFR calc non Af Amer 75 (*) >90 mL/min   GFR calc Af Amer 87 (*) >90 mL/min     Studies/Results Radiology     MEDS, Scheduled    . enoxaparin (LOVENOX) injection  30 mg Subcutaneous Q24H  . feeding supplement  1 Container Oral BID BM  . pantoprazole (PROTONIX) IV  40 mg Intravenous Q1200  . potassium chloride  10 mEq Intravenous Q1 Hr x 5  . potassium  chloride  10 mEq Intravenous Q1 Hr x 5  . tiotropium  18 mcg Inhalation Daily       LOS: 8 days    Currie Paris, MD, [facility], [place] [phone]   03/31/2012 7:34 AM

## 2012-03-31 NOTE — Progress Notes (Signed)
[facility]             Progress Note   Subjective: Feels better, no n/v/abd pain  Objective: Vital signs in last 24 hours: Temp:  [98.1 F (36.7 C)-98.7 F (37.1 C)] 98.7 F (37.1 C) (09/29 0509) Pulse Rate:  [78-82] 79  (09/29 0509) Resp:  [16-18] 18  (09/29 0509) BP: (139-145)/(75-81) 139/81 mmHg (09/29 0509) SpO2:  [96 %-99 %] 96 % (09/29 0509) Weight change:  Last BM Date: 03/30/12  Intake/Output from previous day: 09/28 0701 - 09/29 0700 In: 2855 [I.V.:2355; IV Piggyback:500] Out: 750 [Urine:750]     Physical Exam: General: Alert, awake, oriented x3, in no acute distress. HEENT: No bruits, no goiter, NG in place. Heart: Regular rate and rhythm, without murmurs, rubs, gallops. Lungs: Clear to auscultation bilaterally. Abdomen: Soft, nontender, slightly distended, positive bowel sounds. Extremities: No clubbing cyanosis or edema with positive pedal pulses. Neuro: Grossly intact, nonfocal.  Lab Results: Basic Metabolic Panel:  Basename 03/31/12 0635 03/30/12 0735  NA 141 141  K 3.5 2.9*  CL 104 105  CO2 18* 22  GLUCOSE 52* 74  BUN 7 7  CREATININE 0.62 0.61  CALCIUM 7.6* 7.8*  MG -- --  PHOS -- --   CBC: No results found for this basename: WBC:2,NEUTROABS:2,HGB:2,HCT:2,MCV:2,PLT:2 in the last 72 hours CBG:  Basename 03/28/12 2009 03/28/12 1555 03/28/12 1215  GLUCAP 116* 135* 162*     Recent Results (from the past 240 hour(s))  CULTURE, BLOOD (ROUTINE X 2)     Status: Normal   Collection Time   03/23/12  4:30 AM      Component Value Range Status Comment   Specimen Description BLOOD LEFT ARM   Final    Special Requests BOTTLES DRAWN AEROBIC ONLY 10CC   Final    Culture  Setup Time 03/23/2012 11:36   Final    Culture NO GROWTH 5 DAYS   Final    Report Status 03/29/2012 FINAL   Final   CULTURE, BLOOD (ROUTINE X 2)     Status: Normal   Collection Time   03/23/12  4:45 AM      Component Value Range Status Comment   Specimen  Description BLOOD LEFT HAND   Final    Special Requests BOTTLES DRAWN AEROBIC ONLY [facility]   Final    Culture  Setup Time 03/23/2012 11:36   Final    Culture NO GROWTH 5 DAYS   Final    Report Status 03/29/2012 FINAL   Final   MRSA PCR SCREENING     Status: Normal   Collection Time   03/23/12 11:10 AM      Component Value Range Status Comment   MRSA by PCR NEGATIVE  NEGATIVE Final     Studies/Results: Dg Abd 1 View  03/29/2012  *RADIOLOGY REPORT*  Clinical Data: History of partial small-bowel obstruction.  Follow- up.  ABDOMEN - 1 VIEW  Comparison: 03/28/2012.  CT 03/23/2012.  Findings: On the supine image multiple dilated loops of small intestine are identified.  The largest has a caliber approximately 4 cm which is similar to the largest on the prior study.  There is a paucity of colonic gas.  There is a small amount of rectal gas. No opaque calculi are seen.  Pelvic phleboliths are present. Previous hip ORIF has been performed with hardware in the proximal aspect of each femur without evidence of disruption.  IMPRESSION: Continued abnormal small bowel gas pattern with dilatation on supine image.  A  paucity of colonic gas present. Air-fluid levels cannot be evaluated without erect or decubitus examination. Pattern is consistent on supine image with distal partial small- bowel obstruction without significant change.   Original Report Authenticated By: Crawford Givens, M.D.    Dg Abd 2 Views  03/30/2012  *RADIOLOGY REPORT*  Clinical Data: Partial small bowel obstruction.  ABDOMEN - 2 VIEW  Comparison: 1 day prior and CT of 03/23/2012.  Findings: 2 supine and one right-side up decubitus view.  The right- sided decubitus view demonstrates no free intraperitoneal air. There are numerous air-fluid levels within small bowel loops within the right lower quadrant.  Supine images demonstrate improving small bowel dilatation. Calcifications in the right side of the abdomen are vascular or due to old granulomatous  disease in nodes, when correlated with prior CT.  No pneumatosis. Distal gas and stool.  Bilateral proximal femoral fixation.  Mild osteopenia.  IMPRESSION: Improvement in the small bowel obstruction pattern.  Persistent small bowel air fluid levels, without residual small bowel dilatation.   Original Report Authenticated By: Consuello Bossier, M.D.     Medications: Scheduled Meds:    . enoxaparin (LOVENOX) injection  30 mg Subcutaneous Q24H  . feeding supplement  1 Container Oral BID BM  . pantoprazole (PROTONIX) IV  40 mg Intravenous Q1200  . potassium chloride  10 mEq Intravenous Q1 Hr x 5  . potassium chloride  10 mEq Intravenous Q1 Hr x 5  . tiotropium  18 mcg Inhalation Daily   Continuous Infusions:    . sodium chloride 50 mL/hr at 03/30/12 1942   PRN Meds:.albuterol, morphine injection, ondansetron (ZOFRAN) IV, ondansetron, promethazine, ranitidine, simethicone  Assessment/Plan:  Principal Problem:  *Partial small bowel obstruction Active Problems:  Nausea and vomiting  Dehydration  Hypokalemia  Leukocytosis  COPD (chronic obstructive pulmonary disease)  Hypertension  Acid reflux  ARF (acute renal failure)   PSBO -NGT removed on 03/26/12 - KUB without  Much improvement. -diet was advanced to regular 9/25, but with vomiting and nausea since, then made NPO again, restarted clears 9/25  -tolerated clears again 9/25, was advanced to full liq 9/26 -Pt reluctant to have NGT replaced -hopefully starting to turn the corner - appreciate surgical input, started back on clears again, advance to full liq later today as tolerated - may need CT enterography to eval small bowel if doesn't improve  ARF -2/2 prerenal azotemia with GI losses. -Resolved with IVF. -Creatinine within normal limits.  Hypokalemia and hypomagnesemia -2/2 GI losses and NG suction. -Repleted.  Chronic Leukocytosis -Followed by Dr. Clelia Croft (heme/onc)  -May have early myeloproliferative disorder.    -WBC's down to baseline for her; probably demargination/stress  Due to acute dehydration -Only observation at this point  GERD  -Continue Protonix.   COPD  -Stable.  -Continue inhalers.  DVT Prophylaxis  -Continue Lovenox.   P afib: will not resume pradaxa at DC due to age, GFR - Dispo: back to ALF when better  Called and updated son August Saucer again, cell: [phone] Ambulate PT eval      LOS: 8 days   [facility] Pager: [phone] 03/31/2012, 8:10 AM

## 2012-04-01 LAB — BASIC METABOLIC PANEL
GFR calc non Af Amer: 76 mL/min — ABNORMAL LOW (ref >90)
Glucose, Bld: 84 mg/dL (ref 70–99)
Potassium: 2.7 mEq/L — CL (ref 3.5–5.1)
Sodium: 140 mEq/L (ref 135–145)

## 2012-04-01 MED ORDER — ASPIRIN 325 MG PO TABS
325.0000 mg | ORAL_TABLET | Freq: Every day | ORAL | Status: DC
  Administered 2012-04-01 – 2012-04-02 (×2): 325 mg via ORAL
  Filled 2012-04-01 (×3): qty 1

## 2012-04-01 MED ORDER — INFLUENZA VIRUS VACC SPLIT PF IM SUSP
0.5000 mL | INTRAMUSCULAR | Status: AC
  Administered 2012-04-02: 0.5 mL via INTRAMUSCULAR
  Filled 2012-04-01: qty 0.5

## 2012-04-01 MED ORDER — POTASSIUM CHLORIDE CRYS ER 20 MEQ PO TBCR: 40.0000 meq | EXTENDED_RELEASE_TABLET | Freq: Two times a day (BID) | ORAL | Status: DC

## 2012-04-01 MED ORDER — GUAIFENESIN ER 600 MG PO TB12: 1200.0000 mg | ORAL_TABLET | Freq: Two times a day (BID) | ORAL | Status: DC | PRN

## 2012-04-01 MED ORDER — ASPIRIN 325 MG PO TABS: 325.0000 mg | ORAL_TABLET | Freq: Every day | ORAL | Status: DC

## 2012-04-01 MED ORDER — POTASSIUM CHLORIDE CRYS ER 20 MEQ PO TBCR
40.0000 meq | EXTENDED_RELEASE_TABLET | Freq: Three times a day (TID) | ORAL | Status: DC
  Administered 2012-04-01 – 2012-04-02 (×4): 40 meq via ORAL
  Filled 2012-04-01 (×6): qty 2

## 2012-04-01 MED ORDER — PANTOPRAZOLE SODIUM 40 MG PO TBEC
40.0000 mg | DELAYED_RELEASE_TABLET | Freq: Every day | ORAL | Status: DC
Start: 2012-04-02 — End: 2012-04-02

## 2012-04-01 MED ORDER — METFORMIN HCL 500 MG PO TABS: 500.0000 mg | ORAL_TABLET | Freq: Every day | ORAL | Status: DC

## 2012-04-01 MED ORDER — DILTIAZEM HCL ER COATED BEADS 240 MG PO CP24
240.0000 mg | ORAL_CAPSULE | Freq: Every day | ORAL | Status: DC
  Administered 2012-04-01 – 2012-04-03 (×3): 240 mg via ORAL
  Filled 2012-04-01 (×5): qty 1

## 2012-04-01 NOTE — Progress Notes (Signed)
PHARMACIST - PHYSICIAN COMMUNICATION DR:   Jomarie Longs CONCERNING: Protonix IV to Oral Route Change Policy  RECOMMENDATION: This patient is receiving Protonix by the intravenous route.  Based on criteria approved by the Pharmacy and Therapeutics Committee, this drug is being converted to the equivalent oral dose form(s).  DESCRIPTION: These criteria include:  The patient is eating (either orally or via tube) and/or has been taking other orally administered medications for a least 24 hours  There is no active GI bleed or impaired GI absorption noted.   If you have questions about this conversion, please contact the Pharmacy Department  []   [phone] )  [facility] [x]   [phone] )  [facility]  []   [phone] )  [facility] []   [phone] )  [facility]   Georgina Pillion, PharmD, BCPS 04/01/2012 2:29 PM

## 2012-04-01 NOTE — Clinical Social Work Note (Signed)
Patient medically stable for discharge today, however due to the time discharge paperwork received, CSW advised by [facility] staff Marland Kitchen that d/c would have to be Tuesday, 10/2. MD notified.  Genelle Bal, MSW, LCSW [phone]

## 2012-04-01 NOTE — Progress Notes (Signed)
CRITICAL VALUE ALERT  Critical value received:  K 2.7  Date of notification:  04/01/12  Time of notification:  0704  Critical value read back:yes  Nurse who received alert:  C.Chanler Schreiter, RN  MD notified (1st page):  [facility] on call  Time of first page:  0705  MD notified (2nd page):  Time of second page:  Responding MD:  Junious Silk, NP  Time MD responded:  0715, orders received.

## 2012-04-01 NOTE — Discharge Summary (Signed)
Physician Discharge Summary  Patient ID: NYKERRIA MACCONNELL MRN: [id number] DOB/AGE: 1915-02-12 78 y.o.  Admit date: 03/23/2012 Discharge date: 04/01/2012  Primary Care Physician:  Enrique Sack, MD   Discharge Diagnoses:    Principal Problem:  *Partial small bowel obstruction Active Problems:  Nausea and vomiting  Dehydration  Hypokalemia  Chronic Leukocytosis  COPD (chronic obstructive pulmonary disease)  Hypertension  Acid reflux  ARF (acute renal failure)  Paroxysmal Afib      Medication List     As of 04/01/2012  3:28 PM    STOP taking these medications         dabigatran 150 MG Caps   Commonly known as: PRADAXA      doxycycline 100 MG capsule   Commonly known as: VIBRAMYCIN      methocarbamol 500 MG tablet   Commonly known as: ROBAXIN      TAKE these medications         acetaminophen 325 MG tablet   Commonly known as: TYLENOL   Take 650 mg by mouth every 4 (four) hours as needed. Headache or pain      albuterol 108 (90 BASE) MCG/ACT inhaler   Commonly known as: PROVENTIL HFA;VENTOLIN HFA   Inhale 2 puffs into the lungs every 6 (six) hours as needed. For wheezing cough shortness of breath  Use with adapter      alendronate 70 MG tablet   Commonly known as: FOSAMAX   Take 70 mg by mouth every 7 (seven) days. Take with a full glass of water on an empty stomach.  Usually on sunday      aspirin 325 MG tablet   Take 1 tablet (325 mg total) by mouth daily.      barrier cream Crea   Commonly known as: non-specified   Apply 1 application topically 2 (two) times daily as needed. Moister barrier cream   Apply to buttock as needed for redness      diclofenac sodium 1 % Gel   Commonly known as: VOLTAREN   Apply 2 g topically every 8 (eight) hours as needed. For joint pain      diltiazem 240 MG 24 hr capsule   Commonly known as: CARDIZEM CD   Take 240 mg by mouth daily.      ergocalciferol 50000 UNITS capsule   Commonly known as: VITAMIN D2   Take  50,000 Units by mouth once a week. Usually on Sunday      guaiFENesin 600 MG 12 hr tablet   Commonly known as: MUCINEX   Take 2 tablets (1,200 mg total) by mouth 2 (two) times daily as needed for congestion. Cough and congestion      hydrochlorothiazide 25 MG tablet   Commonly known as: HYDRODIURIL   Take 25 mg by mouth daily.      menthol-cetylpyridinium 3 MG lozenge   Commonly known as: CEPACOL   Take 1 lozenge by mouth every 4 (four) hours as needed. For sore throat      metFORMIN 500 MG tablet   Commonly known as: GLUCOPHAGE   Take 1 tablet (500 mg total) by mouth daily with breakfast.      mirtazapine 15 MG tablet   Commonly known as: REMERON   Take 15 mg by mouth at bedtime.      omeprazole 20 MG capsule   Commonly known as: PRILOSEC   Take 20 mg by mouth daily.      ondansetron 4 MG tablet   Commonly known  as: ZOFRAN   Take 4 mg by mouth every 8 (eight) hours as needed. nausea      PHENASEPTIC 1.4 % Liqd   Generic drug: phenol   Use as directed 1 spray in the mouth or throat as needed. Sore throat      polyethylene glycol packet   Commonly known as: MIRALAX / GLYCOLAX   Take 17 g by mouth daily as needed. constipation      ProCel Powd   Take by mouth 2 (two) times daily.      protein supplement Powd   Take 1 scoop by mouth 2 (two) times daily with a meal.      senna-docusate 8.6-50 MG per tablet   Commonly known as: Senokot-S   Take 2 tablets by mouth daily as needed. For constipation      tiotropium 18 MCG inhalation capsule   Commonly known as: SPIRIVA   Place 18 mcg into inhaler and inhale daily.      VIACTIV PO   Take 1 each by mouth 2 (two) times daily.         Disposition and Follow-up:  PCP in 1 week  Consults: Surgery, General  Significant Diagnostic Studies:  No results found.  Brief H and P: 78 y/o woman with PMH significant for COPD, HTN, GERD, reported CAD and MI, who presents to the hospital with a 3 day h/o epigastric abdominal  pain with nausea and vomiting. Has vomited 3 times today already. In the ED a CT scan showed evidence for an SBO, an NG tube was inserted and we have been asked to admit her for further evaluation and management.    Hospital Course:  PSBO  Initially treated with NGT decompression -NGT removed on 03/26/12  Clinically and based on KUB was very slow to improve  -diet was slowly advanced after multiple attempts -hopefully starting to turn the corner  - was seen by surgery in consultation who recommended supportive care only - finally tolerating a regular bland diet  ARF  -2/2 prerenal azotemia with GI losses.  -Resolved with IVF.  -Creatinine within normal limits.   Hypokalemia and hypomagnesemia  -2/2 GI losses  -Repleted.   P afib:  It is felt that this patient is not a pradaxa candidate given her advanced age (78 years old), low body weight (31.4 kg), and reduced renal function (estimated CrCl~16 ml/min).  Increased bleeding (hemorrhage, hemorrhagic stroke) have been noted in patient's >34 years old and in patients with low body weight and reduced renal function Hence started on ASA 325mg  daily   Chronic Leukocytosis  -Followed by Dr. Clelia Croft (heme/onc)  -May have early myeloproliferative disorder.  -WBC's down to baseline for her; probably demargination/stress Due to acute dehydration  -Only observation at this point   GERD  -Continue Protonix.   COPD  -Stable.  -Continue inhalers.      Time spent on Discharge:  Signed: Clemence Stillings [facility]  04/01/2012, 3:28 PM

## 2012-04-01 NOTE — Evaluation (Signed)
Physical Therapy Evaluation Patient Details Name: Bianca Khan MRN: [id number] DOB: October 01, 1914 Today's Date: 04/01/2012 Time: 9562-1308 PT Time Calculation (min): 24 min  PT Assessment / Plan / Recommendation Clinical Impression  78 y.o. female admitted to [facility] for SBO treated conservatively with NGT (now out) and liquid diet (no solids).  She presents physically with decreased balance during gait and generalized weakness from prolonged hospital stay putting her at risk for falls.      PT Assessment  Patient needs continued PT services    Follow Up Recommendations  Home health PT (with return to [facility])    Barriers to Discharge None      Equipment Recommendations  None recommended by PT    Recommendations for Other Services   NA  Frequency Min 3X/week    Precautions / Restrictions Precautions Precautions: Fall Precaution Comments: h/o falls with hip fractures Restrictions Weight Bearing Restrictions: No   Pertinent Vitals/Pain HR increased to 124 with gait, asymptomatic, likely due to hypokalemia and calcemia.  She is getting meds to correct.       Mobility  Transfers Transfers: Sit to Stand;Stand to Sit Sit to Stand: 5: Supervision;From elevated surface;With upper extremity assist;With armrests;From chair/3-in-1 Stand to Sit: 5: Supervision;With armrests;With upper extremity assist;To chair/3-in-1 Details for Transfer Assistance: supervision for safety Ambulation/Gait Ambulation/Gait Assistance: 4: Min assist Ambulation Distance (Feet): 250 Feet Assistive device: Rolling walker Ambulation/Gait Assistance Details: verbal cues to stay close to RW, as pt fatiges decreased foot clearance and 2-3 small LOB tripping over her left foot needing min assist to recover.   Gait Pattern: Step-to pattern;Shuffle;Trunk flexed       Exercises General Exercises - Upper Extremity Shoulder Flexion: AROM;Both;10 reps;Seated Elbow Flexion: AROM;Both;10 reps;Seated General  Exercises - Lower Extremity Long Arc Quad: AROM;Both;10 reps;Seated Hip Flexion/Marching: AROM;Both;10 reps;Seated Toe Raises: AROM;Both;10 reps;Seated Heel Raises: AROM;Both;10 reps;Seated   PT Diagnosis: Difficulty walking;Abnormality of gait;Generalized weakness  PT Problem List: Decreased strength;Decreased activity tolerance;Decreased balance PT Treatment Interventions: DME instruction;Gait training;Functional mobility training;Therapeutic activities;Therapeutic exercise;Balance training;Neuromuscular re-education;Patient/family education   PT Goals Acute Rehab PT Goals PT Goal Formulation: With patient Time For Goal Achievement: 04/15/12 Potential to Achieve Goals: Good Pt will go Supine/Side to Sit: with modified independence;with HOB 0 degrees PT Goal: Supine/Side to Sit - Progress: Goal set today Pt will go Sit to Supine/Side: with modified independence;with HOB 0 degrees PT Goal: Sit to Supine/Side - Progress: Goal set today Pt will go Sit to Stand: with modified independence PT Goal: Sit to Stand - Progress: Goal set today Pt will go Stand to Sit: with modified independence PT Goal: Stand to Sit - Progress: Goal set today Pt will Ambulate: >150 feet;with modified independence;with rolling walker PT Goal: Ambulate - Progress: Goal set today  Visit Information  Last PT Received On: 04/01/12 Assistance Needed: +1    Subjective Data  Subjective: Ptreports that she has not been up much untill yesterday and that she ate breakfast (solids) and was not nauseated and it stayed down.  She also haed a BM (loose).   Patient Stated Goal: to go back to ALF   Prior Functioning  Home Living Type of Home: Assisted living ([facility]) Home Access: Level entry Home Layout: One level Bathroom Shower/Tub: Health visitor: Handicapped height Home Adaptive Equipment: Shower chair with back;Grab bars in shower;Grab bars around toilet;Hand-held shower hose;Walker -  rolling;Wheelchair - manual;Straight cane;Bedside commode/3-in-1 Additional Comments: used RW for gait PTA and walked to and from dining hall for meals.  Prior Function Level of Independence: Independent with assistive device(s) Driving: No Comments: likes to play bingo Communication Communication: No difficulties Dominant Hand: Right    Cognition  Overall Cognitive Status: Appears within functional limits for tasks assessed/performed Arousal/Alertness: Awake/alert Orientation Level: Appears intact for tasks assessed Behavior During Session: [facility] for tasks performed Cognition - Other Comments: not specifically tested    Extremity/Trunk Assessment Right Upper Extremity Assessment RUE ROM/Strength/Tone: Deficits RUE ROM/Strength/Tone Deficits: right arm was injured in one of her falls, she is unable to raise it as high as the left arm.  She reports there was nofracture, but it looks like she may have injured her rotator cuff on this side.   Left Upper Extremity Assessment LUE ROM/Strength/Tone: WFL for tasks assessed Right Lower Extremity Assessment RLE ROM/Strength/Tone: Deficits RLE ROM/Strength/Tone Deficits: at least 3/5 Left Lower Extremity Assessment LLE ROM/Strength/Tone: Deficits LLE ROM/Strength/Tone Deficits: at least 3/5      End of Session PT - End of Session Activity Tolerance: Patient limited by fatigue Patient left: in chair;with call bell/phone within reach;with chair alarm set       Dannette Kinkaid B. Eshaan Titzer, PT, DPT [phone]   04/01/2012, 11:22 AM

## 2012-04-02 ENCOUNTER — Inpatient Hospital Stay (HOSPITAL_COMMUNITY): Payer: Medicare Other

## 2012-04-02 MED ORDER — SODIUM CHLORIDE 0.9 % IV SOLN
INTRAVENOUS | Status: DC
  Administered 2012-04-02: 75 mL/h via INTRAVENOUS
  Administered 2012-04-03: 1000 mL via INTRAVENOUS
  Administered 2012-04-03: 06:00:00 via INTRAVENOUS

## 2012-04-02 MED ORDER — IOHEXOL 300 MG/ML  SOLN
80.0000 mL | Freq: Once | INTRAMUSCULAR | Status: AC | PRN
  Administered 2012-04-02: 80 mL via INTRAVENOUS

## 2012-04-02 MED ORDER — POTASSIUM CHLORIDE CRYS ER 20 MEQ PO TBCR
40.0000 meq | EXTENDED_RELEASE_TABLET | Freq: Two times a day (BID) | ORAL | Status: DC
  Filled 2012-04-02 (×2): qty 2

## 2012-04-02 MED ORDER — ENOXAPARIN SODIUM 30 MG/0.3ML ~~LOC~~ SOLN
15.0000 mg | SUBCUTANEOUS | Status: DC
  Administered 2012-04-02: 15 mg via SUBCUTANEOUS
  Filled 2012-04-02 (×2): qty 0.15

## 2012-04-02 MED ORDER — PANTOPRAZOLE SODIUM 40 MG IV SOLR
40.0000 mg | INTRAVENOUS | Status: DC
  Administered 2012-04-02 – 2012-04-03 (×2): 40 mg via INTRAVENOUS
  Filled 2012-04-02 (×6): qty 40

## 2012-04-02 MED ORDER — POTASSIUM CHLORIDE 10 MEQ/100ML IV SOLN
10.0000 meq | INTRAVENOUS | Status: AC
  Administered 2012-04-02 (×3): 10 meq via INTRAVENOUS
  Filled 2012-04-02 (×4): qty 100

## 2012-04-02 NOTE — Progress Notes (Signed)
[facility]             Progress Note   Subjective: Sick again this am after doing relatively well for 2 days, vomited x2 today, greenish with brown specks  Objective: Vital signs in last 24 hours: Temp:  [97.8 F (36.6 C)-98.6 F (37 C)] 98.6 F (37 C) (10/01 1000) Pulse Rate:  [72-103] 73  (10/01 1000) Resp:  [17-18] 18  (10/01 1000) BP: (115-147)/(62-86) 129/67 mmHg (10/01 1000) SpO2:  [96 %-98 %] 96 % (10/01 1000) Weight:  [31.7 kg (69 lb 14.2 oz)] 31.7 kg (69 lb 14.2 oz) (09/30 2156) Weight change: -2.048 kg (-4 lb 8.2 oz) Last BM Date: 04/02/12 (small smear)  Intake/Output from previous day: 09/30 0701 - 10/01 0700 In: 240 [P.O.:240] Out: 550 [Urine:550] Total I/O In: 60 [P.O.:60] Out: 135 [Emesis/NG output:135]   Physical Exam: General: Alert, awake, oriented x3, in no acute distress. HEENT: No bruits, no goiter, NG in place. Heart: Regular rate and rhythm, without murmurs, rubs, gallops. Lungs: Clear to auscultation bilaterally. Abdomen: Soft, nontender, slightly distended, positive bowel sounds. Extremities: No clubbing cyanosis or edema with positive pedal pulses. Neuro: Grossly intact, nonfocal.  Lab Results: Basic Metabolic Panel:  Basename 04/01/12 0509 03/31/12 0635  NA 140 141  K 2.7* 3.5  CL 103 104  CO2 24 18*  GLUCOSE 84 52*  BUN 4* 7  CREATININE 0.58 0.62  CALCIUM 7.8* 7.6*  MG -- --  PHOS -- --   CBC: No results found for this basename: WBC:2,NEUTROABS:2,HGB:2,HCT:2,MCV:2,PLT:2 in the last 72 hours CBG: No results found for this basename: GLUCAP:6 in the last 72 hours   No results found for this or any previous visit (from the past 240 hour(s)).  Studies/Results: No results found.  Medications: Scheduled Meds:    . aspirin  325 mg Oral Daily  . diltiazem  240 mg Oral Daily  . enoxaparin (LOVENOX) injection  30 mg Subcutaneous Q24H  . feeding supplement  1 Container Oral BID BM  . influenza  inactive virus  vaccine  0.5 mL Intramuscular Tomorrow-1000  . pantoprazole (PROTONIX) IV  40 mg Intravenous Q24H  . potassium chloride  40 mEq Oral BID  . tiotropium  18 mcg Inhalation Daily  . DISCONTD: pantoprazole  40 mg Oral Q1200  . DISCONTD: pantoprazole (PROTONIX) IV  40 mg Intravenous Q1200  . DISCONTD: potassium chloride  40 mEq Oral TID   Continuous Infusions:    . DISCONTD: sodium chloride 10 mL/hr (03/31/12 0935)   PRN Meds:.albuterol, morphine injection, ondansetron (ZOFRAN) IV, ondansetron, promethazine, ranitidine, simethicone  Assessment/Plan:  Principal Problem:  *Partial small bowel obstruction Active Problems:  Nausea and vomiting  Dehydration  Hypokalemia  Leukocytosis  COPD (chronic obstructive pulmonary disease)  Hypertension  Acid reflux  ARF (acute renal failure)   PSBO -NGT removed on 03/26/12 - KUB without  Much improvement. - she has been very slow to improve -diet was advanced to regular 9/25, but with vomiting and nausea since, then made NPO again, restarted clears 9/25  -tolerated clears again 9/25, was advanced to full liq 9/26 --Was finally starting to turn the corner over the weekend, was seen by [facility] and slowly restarted on diet, but with vomiting again  Will repeat KUB and check UGI with small bowel follow through Will D/w Surgery again Restart clears  ARF -2/2 prerenal azotemia with GI losses. -Resolved with IVF. -Creatinine within normal limits.  Hypokalemia and hypomagnesemia -2/2 GI losses and NG suction. -Repleted.  Chronic Leukocytosis -Followed by Dr. Clelia Croft (heme/onc)  -May have early myeloproliferative disorder.  -WBC's down to baseline for her; probably demargination/stress  Due to acute dehydration -Only observation at this point  GERD  -Continue Protonix.   COPD  -Stable.  -Continue inhalers.  DVT Prophylaxis  -Continue Lovenox.   P afib: will not resume pradaxa at DC due to age, GFR, ASA 325mg  at DC - Dispo: back to  ALF when better, Hold DC  Will call and update son August Saucer , cell: [phone]      LOS: 10 days   [facility] Pager: [phone] 04/02/2012, 11:26 AM

## 2012-04-02 NOTE — Progress Notes (Signed)
NG tube inserted without difficulty per physicians order. Pt tolerated it well. Inserted in right nare. Checked per policy by two RN's. Mervin Hack.

## 2012-04-02 NOTE — Progress Notes (Signed)
General surgery attending note:  I agree with the assessment and plan outlined by Barnetta Chapel, PA.  This patient is distended once again. Her potassium level is 2.7 and that may be contributing.  X-rays showed recurrent small bowel distention, worrisome for SBO  Exam shows lower abdominal distention, mild diffuse tenderness, almost absent bowel sounds, but no peritoneal signs. She does not appear to be in any significant distress.  Assessment: Recurrent partial SBO.  The fact that this has recurred twice as an inpatient raises the likelihood that she will require surgical intervention for a resolution of the problem. There is no evidence of compromised bowel at this time.  Hypokalemia. I would strongly recommend that her potassium level being elevated to 4.0 to be sure that we eliminate the possibility of hypokalemia related ileus.   Plan: If she vomits again I would place NG tube. Because she has been vomiting and has stopped eating I would restart her IV fluids. I have taken the liberty of doing that. Treat hypokalemia and get K>4.0.I have taken the liberty of ordering some IV potassium runs. I agree with the small bowel follow-through, or even CT enterography would be helpful to see if there is any transition point We will continue to follow and discuss management options with the patient and the managing team. We may be faced with a surgical decision, although this is not emergent at this time.   Angelia Mould. Derrell Lolling, M.D., [facility] General and Minimally invasive Surgery Breast and Colorectal Surgery Office:   [phone] Pager:   [phone]

## 2012-04-02 NOTE — Progress Notes (Signed)
Pt clo nauseau and vomiting. Spitting moderate amount of thick mucus up and then vomited a small amount of greenish emesis with coffee grounds like specks

## 2012-04-02 NOTE — Clinical Social Work Note (Signed)
CSW received call from MD advising that patient is sick and will not discharge back to [facility] today. Call made to staff person, Marland Kitchen [phone]) to advise that patient not medically stable to return. CSW will continue to monitor patient progress and facilitate discharge to facility when stable.  Genelle Bal, MSW, LCSW [phone]

## 2012-04-02 NOTE — Progress Notes (Signed)
Pt not taking the resource nutrition drinks. Very little intake.

## 2012-04-02 NOTE — Progress Notes (Signed)
Patient ID: Bianca Khan, female   DOB: 1915-05-07, 78 y.o.   MRN: [id number]    Subjective: Asked to see patient again due to relapse in her PSBO.  She was supposed to be dc yesterday, but was not due to bed issues.  Last night around 12 midnight, she developed nausea and vomiting again.  She had 2 other episodes of emesis this morning that the nurse reports as being bilious.  The patient has not had much in the way of a BM and is not passing any flatus currently.  Objective: Vital signs in last 24 hours: Temp:  [97.8 F (36.6 C)-98.6 F (37 C)] 98.6 F (37 C) (10/01 1000) Pulse Rate:  [72-103] 73  (10/01 1000) Resp:  [17-18] 18  (10/01 1000) BP: (115-147)/(62-86) 129/67 mmHg (10/01 1000) SpO2:  [96 %-98 %] 96 % (10/01 1000) Weight:  [69 lb 14.2 oz (31.7 kg)] 69 lb 14.2 oz (31.7 kg) (09/30 2156) Last BM Date: 04/02/12 (small smear)  Intake/Output from previous day: 09/30 0701 - 10/01 0700 In: 240 [P.O.:240] Out: 550 [Urine:550] Intake/Output this shift: Total I/O In: 60 [P.O.:60] Out: 135 [Emesis/NG output:135]  PE: Abd: soft, some lower abdominal distention with tympany, hypoactive BS, nontender Heart: regular Lungs: CTAB  Lab Results:  No results found for this basename: WBC:2,HGB:2,HCT:2,PLT:2 in the last 72 hours BMET  [facility] 04/01/12 0509 03/31/12 0635  NA 140 141  K 2.7* 3.5  CL 103 104  CO2 24 18*  GLUCOSE 84 52*  BUN 4* 7  CREATININE 0.58 0.62  CALCIUM 7.8* 7.6*   PT/INR No results found for this basename: LABPROT:2,INR:2 in the last 72 hours CMP     Component Value Date/Time   NA 140 04/01/2012 0509   K 2.7* 04/01/2012 0509   CL 103 04/01/2012 0509   CO2 24 04/01/2012 0509   GLUCOSE 84 04/01/2012 0509   BUN 4* 04/01/2012 0509   CREATININE 0.58 04/01/2012 0509   CALCIUM 7.8* 04/01/2012 0509   PROT 7.1 03/23/2012 0441   ALBUMIN 3.9 03/23/2012 0441   AST 17 03/23/2012 0441   ALT 8 03/23/2012 0441   ALKPHOS 77 03/23/2012 0441   BILITOT 0.6 03/23/2012 0441   GFRNONAA 76* 04/01/2012 0509   GFRAA 88* 04/01/2012 0509   Lipase     Component Value Date/Time   LIPASE 11 03/23/2012 0441       Studies/Results: No results found.  Anti-infectives: Anti-infectives    None       Assessment/Plan  1. PSBO, worsening again 2. Hypokalemia  Plan: 1. Replace K as this can affect bowel function 2. KUB pending, if PSBO recurrent patient may need an NGT again.  If she does not improve over all she may require surgical intervention.  A SBFT has been ordered for tomorrow, which I think is a good idea to better get an idea of where her problem may arise from.  We will continue to follow with you.   LOS: 10 days    Lucianne Smestad E 04/02/2012, 11:56 AM Pager: [phone]

## 2012-04-03 ENCOUNTER — Inpatient Hospital Stay (HOSPITAL_COMMUNITY): Payer: Medicare Other

## 2012-04-03 LAB — BASIC METABOLIC PANEL
BUN: 8 mg/dL (ref 6–23)
Chloride: 98 mEq/L (ref 96–112)
GFR calc Af Amer: 87 mL/min — ABNORMAL LOW (ref >90)
GFR calc non Af Amer: 75 mL/min — ABNORMAL LOW (ref >90)
Potassium: 4 mEq/L (ref 3.5–5.1)
Sodium: 138 mEq/L (ref 135–145)

## 2012-04-03 LAB — SURGICAL PCR SCREEN: Staphylococcus aureus: NEGATIVE

## 2012-04-03 MED ORDER — CHLORHEXIDINE GLUCONATE 4 % EX LIQD
1.0000 "application " | Freq: Once | CUTANEOUS | Status: AC
  Administered 2012-04-04: 1 via TOPICAL
  Filled 2012-04-03: qty 15

## 2012-04-03 MED ORDER — DEXTROSE 5 % IV SOLN
2.0000 g | INTRAVENOUS | Status: AC
  Administered 2012-04-04 (×2): 2 g via INTRAVENOUS
  Filled 2012-04-03: qty 2

## 2012-04-03 NOTE — Progress Notes (Signed)
[facility]             Progress Note   Subjective: States she feels better this a.m., ambulated with PT.  Objective: Vital signs in last 24 hours: Temp:  [97.8 F (36.6 C)-98.5 F (36.9 C)] 98.5 F (36.9 C) (10/02 1338) Pulse Rate:  [75-94] 91  (10/02 1338) Resp:  [16-18] 18  (10/02 1338) BP: (107-145)/(65-76) 107/65 mmHg (10/02 1338) SpO2:  [96 %-98 %] 96 % (10/02 1338) Weight:  [30.89 kg (68 lb 1.6 oz)] 30.89 kg (68 lb 1.6 oz) (10/01 2038) Weight change: -0.81 kg (-1 lb 12.6 oz) Last BM Date: 04/03/12 (small )  Intake/Output from previous day: 10/01 0701 - 10/02 0700 In: 60 [P.O.:60] Out: 1735 [Urine:500; Emesis/NG output:1235] Total I/O In: 0  Out: 150 [Urine:150]   Physical Exam: General: Alert, awake, oriented x3, in no acute distress. HEENT: No bruits, no goiter, NG in place. Heart: Irreegular rate and rhythm, rate controlled. without murmurs, rubs, gallops. Lungs: Clear to auscultation bilaterally. Abdomen: Soft, nontender, slightly distended, decreased bowel sounds. Extremities: No clubbing cyanosis or edema with positive pedal pulses. Neuro: Grossly intact, nonfocal.  Lab Results: Basic Metabolic Panel:  Basename 04/03/12 0630 04/01/12 0509  NA 138 140  K 4.0 2.7*  CL 98 103  CO2 22 24  GLUCOSE 120* 84  BUN 8 4*  CREATININE 0.61 0.58  CALCIUM 9.1 7.8*  MG -- --  PHOS -- --   CBC: No results found for this basename: WBC:2,NEUTROABS:2,HGB:2,HCT:2,MCV:2,PLT:2 in the last 72 hours CBG: No results found for this basename: GLUCAP:6 in the last 72 hours   No results found for this or any previous visit (from the past 240 hour(s)).  Studies/Results: Dg Abd 1 View  04/02/2012  *RADIOLOGY REPORT*  Clinical Data: Partial small bowel obstruction  ABDOMEN - 1 VIEW  Comparison: 03/30/2012  Findings: Interval worsening gaseous distention of small bowel measuring 3.8 cm in diameter in the left abdomen compatible with a recurrent small bowel  obstruction pattern compared to 03/30/2012. Stable chronic calcifications of the pancreatic head and uncinate process better demonstrated by CT.  Postop changes of both hips. Degenerative changes of the spine.  Lung bases remain clear.  IMPRESSION: Worsening small bowel dilatation compatible with recurring small bowel obstruction.   Original Report Authenticated By: Judie Petit. Ruel Favors, M.D.    Ct Entero Abd/pelvis W/cm  04/03/2012  *RADIOLOGY REPORT*  Clinical Data:  Persistent small bowel obstruction.  Nausea.  Pain.  CT ABDOMEN AND PELVIS WITH CONTRAST (CT ENTEROGRAPHY)  Technique:  Multidetector CT of the abdomen and pelvis during bolus administration of intravenous contrast. Negative oral contrast VoLumen was given.  Contrast: 80mL OMNIPAQUE IOHEXOL 300 MG/ML  SOLN  Comparison:  CT from 03/23/2012  Findings: Images which include the lower chest show a small right pleural effusion.  Moderate hiatal hernia is evident.  NG tube tip is in the proximal stomach, tenting the lateral wall of the stomach.  There is no evidence for inner wall or transmural hyperenhancement in the stomach.  The pylorus and duodenal bulb have normal imaging features.  The duodenum is not dilated.  A small bowel dilatation begins just distal to the ligament of Treitz. Small bowel loops are diffusely fluid-filled and dilated up to 3.4 cm in diameter in the pelvis and 3.7 cm in diameter in the abdomen.  An abrupt transition zone is identified in the posterior right pelvis (see image 62 of series 2).  There is some subtly increased transmural enhancement  in this region which may be related to the collapsed bowel has imaging features are not entirely suggestive of an inflammatory stricture.  Distal to this transition zone, there is a loop of small bowel in the cul-de-sac which is dilated up to about 2.9 cm.  This appears to leading to a second transition zone involving the distal ileum, about 15-25 cm proximal to the ileocecal valve.  The distal  ileum is completely decompressed.  The colon is decompressed throughout.  No focal abnormalities seen in the liver.  There is some mild periportal edema.  Spleen is unremarkable.  There appear to be years some dystrophic calcification in the uncinate process and pancreatic head.  Adrenal glands are unremarkable.  There is cortical scarring in both kidneys.  No abdominal aortic aneurysm.  The celiac axis and superior mesenteric artery opacify as does the inferior mesenteric artery. The superior mesenteric vein and portal vein are patent.  Imaging through the pelvis shows a small amount of free fluid in the cul-de-sac.  Bladder is unremarkable.  Uterus is surgically absent.  There is no adnexal mass.  The terminal ileum is decompressed.  The appendix is normal.  The patient is status post pin placement in the left femoral neck and plate and screw fixation of the right femoral neck.  Bones are diffusely demineralized.  IMPRESSION:  Imaging features consistent with distal small bowel obstruction. There is abrupt transition zone in the posterior lower right pelvis.  This is a relatively short transition zone before extending into another fluid filled dilated short segment of small bowel which leads into a second transition point.  The distal and terminal ileum beyond the second transition zone are completely collapsed as is the colon.  The presence of two transition zones raises the possibility of closed loop obstruction.  There is a small amount of free fluid in cul-de-sac.   Original Report Authenticated By: ERIC A. MANSELL, M.D.     Medications: Scheduled Meds:    . cefOXitin  2 g Intravenous 60 min Pre-Op  . chlorhexidine  1 application Topical Once  . diltiazem  240 mg Oral Daily  . pantoprazole (PROTONIX) IV  40 mg Intravenous Q24H  . potassium chloride  10 mEq Intravenous Q1 Hr x 3  . tiotropium  18 mcg Inhalation Daily  . DISCONTD: aspirin  325 mg Oral Daily  . DISCONTD: enoxaparin (LOVENOX)  injection  15 mg Subcutaneous Q24H  . DISCONTD: feeding supplement  1 Container Oral BID BM  . DISCONTD: potassium chloride  40 mEq Oral BID   Continuous Infusions:    . sodium chloride 75 mL/hr at 04/03/12 0627   PRN Meds:.albuterol, iohexol, morphine injection, ondansetron (ZOFRAN) IV, ondansetron, promethazine, ranitidine, simethicone  Assessment/Plan:  Principal Problem:  *Partial small bowel obstruction Active Problems:  Nausea and vomiting  Dehydration  Hypokalemia  Leukocytosis  COPD (chronic obstructive pulmonary disease)  Hypertension  Acid reflux  ARF (acute renal failure)   PSBO -NGT remains in place - CT enterography with 2 transition zones  Raising the possibility of closed loop instructions, the distal and terminal ileum beyond the second transition zone are completely collapsed as is the colon. -Discussed with surgery PA early this a.m., patient now scheduled for exploratory laparotomy in the a.m. - she has been very slow to improve -diet was advanced to regular 9/25, but with vomiting and nausea since, then made NPO again, restarted clears 9/25  -tolerated clears again 9/25, was advanced to full liq 9/26 --Was finally starting  to turn the corner over the weekend, was seen by CCS and slowly restarted on diet, but with vomiting again  Will repeat KUB and check UGI with small bowel follow through  ARF -2/2 prerenal azotemia with GI losses. -Resolved with IVF. -Creatinine within normal limits.  Hypokalemia and hypomagnesemia -2/2 GI losses and NG suction. -Repleted.  Chronic Leukocytosis -Followed by Dr. Clelia Croft (heme/onc)  -May have early myeloproliferative disorder.  -WBC's down to baseline for her; probably demargination/stress  Due to acute dehydration -Only observation at this point  GERD  -Continue Protonix.   COPD  -Stable.  -Continue inhalers.  DVT Prophylaxis  -Continue Lovenox.   P afib: Plan is tonot resume pradaxa at DC due to age,  GFR, ASA 325mg  at DC - Dispo: back to ALF when better, Hold DC        LOS: 11 days   Tanyla Stege C [facility] Pager: [phone] 04/03/2012, 4:35 PM

## 2012-04-03 NOTE — Progress Notes (Addendum)
General surgery attending note:  Agree with the assessment and treatment plan outlined by [facility] white, PA. I have interviewed and examined the patient and discussed her care plan with her son August Saucer [phone]).  On exam she is distended and tympanitic, minimally tender, no hernias.  CT enterography shows 2 strictures in tandem deep in the pelvis in the terminal ileum. These be appeared to be a fixed mechanical obstruction.  Assessment/Plan:    Recurrent small bowel structures secondary to adhesions and/or stricture of terminal ileum. I do not think that this will resolve with conservative therapy. She has been given more than adequate trial of conservative therapy. I have advised the patient and her son August Saucer that we will need to proceed with a laparotomy and release of her small bowel obstruction or she will need to consider palliative care. Both the patient and her son feel that  they would like to go ahead with surgery. The son is in [place] and asks if we cant the surgery off until tomorrow. I told him we would plan to do that unless her condition deteriorated. She will be scheduled for expiratory laparotomy tomorrow.  I discussed the indications, details, techniques, and numerous risks of the surgery with the patient and her son August Saucer. All their questions are answered. They understand these issues. They agree with this plan.  Preoperative orders written. Obviously high risk due to advanced age.   Angelia Mould. Derrell Lolling, M.D., [facility] General and Minimally invasive Surgery Breast and Colorectal Surgery Office:   [phone] Pager:   [phone]

## 2012-04-03 NOTE — Progress Notes (Signed)
Nutrition Follow-up  Intervention:   1.  Nutrition support; strongly recommend initiation TPN, as pt has remained on limited diet throughout hospitalization and has refused oral nutrition supplements. This RD discussed this Dr. Donna Bernard. Monitor magnesium, potassium, and phosphorus daily for at least 3 days, MD to replete as needed, as pt is at risk for refeeding syndrome given prolonged suboptimal PO intake. 2. RD to continue to follow nutrition care plan.  Assessment:   Pt with repleted potassium at this time. NGT placed for suction 2/2 recurrent small bowel stricture. Pt may be proceeding with surgery.  Diet Order:  NPO  Meds: Scheduled Meds:    . diltiazem  240 mg Oral Daily  . influenza  inactive virus vaccine  0.5 mL Intramuscular Tomorrow-1000  . pantoprazole (PROTONIX) IV  40 mg Intravenous Q24H  . potassium chloride  10 mEq Intravenous Q1 Hr x 3  . tiotropium  18 mcg Inhalation Daily  . DISCONTD: aspirin  325 mg Oral Daily  . DISCONTD: enoxaparin (LOVENOX) injection  15 mg Subcutaneous Q24H  . DISCONTD: enoxaparin (LOVENOX) injection  30 mg Subcutaneous Q24H  . DISCONTD: feeding supplement  1 Container Oral BID BM  . DISCONTD: pantoprazole  40 mg Oral Q1200  . DISCONTD: potassium chloride  40 mEq Oral TID  . DISCONTD: potassium chloride  40 mEq Oral BID   Continuous Infusions:    . sodium chloride 75 mL/hr at 04/03/12 0627   PRN Meds:.albuterol, iohexol, morphine injection, ondansetron (ZOFRAN) IV, ondansetron, promethazine, ranitidine, simethicone  Labs:  CMP     Component Value Date/Time   NA 138 04/03/2012 0630   K 4.0 04/03/2012 0630   CL 98 04/03/2012 0630   CO2 22 04/03/2012 0630   GLUCOSE 120* 04/03/2012 0630   BUN 8 04/03/2012 0630   CREATININE 0.61 04/03/2012 0630   CALCIUM 9.1 04/03/2012 0630   PROT 7.1 03/23/2012 0441   ALBUMIN 3.9 03/23/2012 0441   AST 17 03/23/2012 0441   ALT 8 03/23/2012 0441   ALKPHOS 77 03/23/2012 0441   BILITOT 0.6 03/23/2012 0441   GFRNONAA 75* 04/03/2012 0630   GFRAA 87* 04/03/2012 0630   Sodium  Date/Time Value Range Status  04/03/2012  6:30 AM 138  135 - 145 mEq/L Final  04/01/2012  5:09 AM 140  135 - 145 mEq/L Final  03/31/2012  6:35 AM 141  135 - 145 mEq/L Final    Potassium  Date/Time Value Range Status  04/03/2012  6:30 AM 4.0  3.5 - 5.1 mEq/L Final  04/01/2012  5:09 AM 2.7* 3.5 - 5.1 mEq/L Final     CRITICAL RESULT CALLED TO, READ BACK BY AND VERIFIED WITH:     C.ECKELMANN RN [id number] E.GADDY  03/31/2012  6:35 AM 3.5  3.5 - 5.1 mEq/L Final    Phosphorus  Date/Time Value Range Status  04/17/2010  9:50 PM 3.5  2.3 - 4.6 mg/dL Final  01/31/1913  7:82 AM 2.5  2.3 - 4.6 mg/dL Final    Magnesium  Date/Time Value Range Status  03/24/2012 10:19 AM 1.2* 1.5 - 2.5 mg/dL Final  9/56/2130  8:65 AM 1.6  1.5 - 2.5 mg/dL Final  78/46/9629  5:28 AM 1.5  1.5 - 2.5 mg/dL Final    Intake/Output Summary (Last 24 hours) at 04/03/12 1001 Last data filed at 04/03/12 0805  Gross per 24 hour  Intake      0 ml  Output   1450 ml  Net  -1450 ml    Weight  Status:  68 lbs Admission wt: 66 lbs  Restatement of needs: [phone] kcal, 30-40g protein, ~1.2 L/day  Nutrition Dx:  Inadequate oral intake, ongoing  Goal: Pt to meet >/= 90% of their estimated nutrition needs.  Not met, pt continues on liquid diet with poor supplement intake.   Monitor: weight trends, lab trends, I/O's, PO intake, supplement tolerance  Jarold Motto MS, RD, LDN Pager: [phone] After-hours pager: [phone]

## 2012-04-03 NOTE — Progress Notes (Signed)
Patient ID: Bianca Khan, female   DOB: August 02, 1914, 78 y.o.   MRN: [id number]    Subjective: No nausea or vomiting over night but no flatus or BM, abd pain with palp but not much at rest  Objective: Vital signs in last 24 hours: Temp:  [97.8 F (36.6 C)-98.6 F (37 C)] 97.8 F (36.6 C) (10/02 0805) Pulse Rate:  [73-94] 86  (10/02 0805) Resp:  [16-18] 18  (10/02 0805) BP: (122-145)/(65-76) 122/73 mmHg (10/02 0805) SpO2:  [96 %-98 %] 96 % (10/02 0810) Weight:  [68 lb 1.6 oz (30.89 kg)] 68 lb 1.6 oz (30.89 kg) (10/01 2038) Last BM Date: 04/02/12  Intake/Output from previous day: 10/01 0701 - 10/02 0700 In: 60 [P.O.:60] Out: 1735 [Urine:500; Emesis/NG output:1235] Intake/Output this shift:    PE: Abd: soft, some lower abdominal distention with tympany, hypoactive BS, tender in epigastric region Heart: regular Lungs: CTA bil  Lab Results:  No results found for this basename: WBC:2,HGB:2,HCT:2,PLT:2 in the last 72 hours BMET  Basename 04/03/12 0630 04/01/12 0509  NA 138 140  K 4.0 2.7*  CL 98 103  CO2 22 24  GLUCOSE 120* 84  BUN 8 4*  CREATININE 0.61 0.58  CALCIUM 9.1 7.8*   PT/INR No results found for this basename: LABPROT:2,INR:2 in the last 72 hours CMP     Component Value Date/Time   NA 138 04/03/2012 0630   K 4.0 04/03/2012 0630   CL 98 04/03/2012 0630   CO2 22 04/03/2012 0630   GLUCOSE 120* 04/03/2012 0630   BUN 8 04/03/2012 0630   CREATININE 0.61 04/03/2012 0630   CALCIUM 9.1 04/03/2012 0630   PROT 7.1 03/23/2012 0441   ALBUMIN 3.9 03/23/2012 0441   AST 17 03/23/2012 0441   ALT 8 03/23/2012 0441   ALKPHOS 77 03/23/2012 0441   BILITOT 0.6 03/23/2012 0441   GFRNONAA 75* 04/03/2012 0630   GFRAA 87* 04/03/2012 0630   Lipase     Component Value Date/Time   LIPASE 11 03/23/2012 0441       Studies/Results: Dg Abd 1 View  04/02/2012  *RADIOLOGY REPORT*  Clinical Data: Partial small bowel obstruction  ABDOMEN - 1 VIEW  Comparison: 03/30/2012  Findings: Interval  worsening gaseous distention of small bowel measuring 3.8 cm in diameter in the left abdomen compatible with a recurrent small bowel obstruction pattern compared to 03/30/2012. Stable chronic calcifications of the pancreatic head and uncinate process better demonstrated by CT.  Postop changes of both hips. Degenerative changes of the spine.  Lung bases remain clear.  IMPRESSION: Worsening small bowel dilatation compatible with recurring small bowel obstruction.   Original Report Authenticated By: Judie Petit. Ruel Favors, M.D.    Ct Entero Abd/pelvis W/cm  04/03/2012  *RADIOLOGY REPORT*  Clinical Data:  Persistent small bowel obstruction.  Nausea.  Pain.  CT ABDOMEN AND PELVIS WITH CONTRAST (CT ENTEROGRAPHY)  Technique:  Multidetector CT of the abdomen and pelvis during bolus administration of intravenous contrast. Negative oral contrast VoLumen was given.  Contrast: 80mL OMNIPAQUE IOHEXOL 300 MG/ML  SOLN  Comparison:  CT from 03/23/2012  Findings: Images which include the lower chest show a small right pleural effusion.  Moderate hiatal hernia is evident.  NG tube tip is in the proximal stomach, tenting the lateral wall of the stomach.  There is no evidence for inner wall or transmural hyperenhancement in the stomach.  The pylorus and duodenal bulb have normal imaging features.  The duodenum is not dilated.  A small bowel  dilatation begins just distal to the ligament of Treitz. Small bowel loops are diffusely fluid-filled and dilated up to 3.4 cm in diameter in the pelvis and 3.7 cm in diameter in the abdomen.  An abrupt transition zone is identified in the posterior right pelvis (see image 62 of series 2).  There is some subtly increased transmural enhancement in this region which may be related to the collapsed bowel has imaging features are not entirely suggestive of an inflammatory stricture.  Distal to this transition zone, there is a loop of small bowel in the cul-de-sac which is dilated up to about 2.9 cm.  This  appears to leading to a second transition zone involving the distal ileum, about 15-25 cm proximal to the ileocecal valve.  The distal ileum is completely decompressed.  The colon is decompressed throughout.  No focal abnormalities seen in the liver.  There is some mild periportal edema.  Spleen is unremarkable.  There appear to be years some dystrophic calcification in the uncinate process and pancreatic head.  Adrenal glands are unremarkable.  There is cortical scarring in both kidneys.  No abdominal aortic aneurysm.  The celiac axis and superior mesenteric artery opacify as does the inferior mesenteric artery. The superior mesenteric vein and portal vein are patent.  Imaging through the pelvis shows a small amount of free fluid in the cul-de-sac.  Bladder is unremarkable.  Uterus is surgically absent.  There is no adnexal mass.  The terminal ileum is decompressed.  The appendix is normal.  The patient is status post pin placement in the left femoral neck and plate and screw fixation of the right femoral neck.  Bones are diffusely demineralized.  IMPRESSION:  Imaging features consistent with distal small bowel obstruction. There is abrupt transition zone in the posterior lower right pelvis.  This is a relatively short transition zone before extending into another fluid filled dilated short segment of small bowel which leads into a second transition point.  The distal and terminal ileum beyond the second transition zone are completely collapsed as is the colon.  The presence of two transition zones raises the possibility of closed loop obstruction.  There is a small amount of free fluid in cul-de-sac.   Original Report Authenticated By: ERIC A. MANSELL, M.D.     Anti-infectives: Anti-infectives    None       Assessment/Plan  1. PSBO, worsening again 2. Hypokalemia  Plan: 1. Replace K as this can affect bowel function 2. SB follow through showed 2 areas of stricture that are short and very close to  each other, the SB and colon is collapsed past that.  Possibly will need to have surgery given SB FT results.  Discussed with patient and will have Dr. Derrell Lolling speak with patient more definetively about whether surgery is indicated.  Will need to discuss with son, August Saucer, as he helps with making decisions for patient.  Continue NPO and IVFs, may need to consider TPN.  Will discuss with attending as well.  LOS: 11 days    WHITE, ELIZABETH 04/03/2012, 8:44 AM

## 2012-04-03 NOTE — Plan of Care (Signed)
Problem: Diagnosis - Type of Surgery Goal: General Surgical Patient Education (See Patient Education module for education specifics) Outcome: Completed/Met Date Met:  04/03/12 Care notes given for exploratory laparotomy and after care.

## 2012-04-03 NOTE — Progress Notes (Signed)
Physical Therapy Treatment Patient Details Name: Bianca Khan MRN: [id number] DOB: 05-21-1915 Today's Date: 04/03/2012 Time: 0910-0950 PT Time Calculation (min): 40 min  PT Assessment / Plan / Recommendation Comments on Treatment Session  Pt quite hesitant to walk, and required max encouragement to participate; At end of walk, she did report feeling better; continued small losses of balance are concerning for higer fall risk; likely to OR tomorrow for SBO    Follow Up Recommendations  Home health PT (at ALF)    Barriers to Discharge        Equipment Recommendations  None recommended by PT    Recommendations for Other Services    Frequency Min 3X/week   Plan Discharge plan remains appropriate (may need to rethink dc plan post op)    Precautions / Restrictions Precautions Precautions: Fall Precaution Comments: h/o falls with hip fractures Restrictions Weight Bearing Restrictions: No Other Position/Activity Restrictions: NGT on wall suction; per Dr. Derrell Lolling, OK to be unconnected intermittently for amb   Pertinent Vitals/Pain no apparent distress     Mobility  Bed Mobility Bed Mobility: Supine to Sit;Sitting - Scoot to Edge of Bed Supine to Sit: 5: Supervision;With rails Sitting - Scoot to Edge of Bed: 5: Supervision;With rail Details for Bed Mobility Assistance: Overall moved to sitting position well; supervision for safety Transfers Transfers: Sit to Stand;Stand to Sit Sit to Stand: 4: Min assist;From bed;From chair/3-in-1;With upper extremity assist Stand to Sit: 4: Min assist;With armrests;To chair/3-in-1;To bed Details for Transfer Assistance: supervision for safety, and cues for safe hand placement; noted unsteadiness at initial stand, requiring min assist to steady Ambulation/Gait Ambulation/Gait Assistance: 4: Min assist Ambulation Distance (Feet): 200 Feet Assistive device: Rolling walker Ambulation/Gait Assistance Details: Continued cues for RW proximity and  control; Noted some lossess of balance requiring min assist ot recover Gait Pattern: Step-to pattern;Shuffle;Trunk flexed    Exercises     PT Diagnosis:    PT Problem List:   PT Treatment Interventions:     PT Goals Acute Rehab PT Goals Time For Goal Achievement: 04/15/12 Potential to Achieve Goals: Good Pt will go Supine/Side to Sit: with modified independence;with HOB 0 degrees PT Goal: Supine/Side to Sit - Progress: Progressing toward goal Pt will go Sit to Supine/Side: with modified independence;with HOB 0 degrees PT Goal: Sit to Supine/Side - Progress: Progressing toward goal Pt will go Sit to Stand: with modified independence PT Goal: Sit to Stand - Progress: Progressing toward goal (slowly) Pt will go Stand to Sit: with modified independence PT Goal: Stand to Sit - Progress: Progressing toward goal Pt will Ambulate: >150 feet;with modified independence;with rolling walker PT Goal: Ambulate - Progress: Progressing toward goal  Visit Information  Last PT Received On: 04/03/12 Assistance Needed: +1    Subjective Data  Subjective: Hesitant to walk; required max encouragement; Quite preoccupied with likely surgery for SBO tomorrow   Cognition  Overall Cognitive Status: Appears within functional limits for tasks assessed/performed Arousal/Alertness: Awake/alert Orientation Level: Appears intact for tasks assessed Behavior During Session: [facility] for tasks performed    Balance  Balance Balance Assessed: Yes Static Standing Balance Static Standing - Balance Support: Right upper extremity supported;Left upper extremity supported;No upper extremity supported Static Standing - Level of Assistance: 4: Min assist Static Standing - Comment/# of Minutes: Stood at sink to wash hands after using BSC; small loss of balance requiring min assist to recover  End of Session PT - End of Session Activity Tolerance: Patient limited by fatigue Patient left: in  bed;with call bell/phone within  reach Nurse Communication: Mobility status;Other (comment) (Pt had small BM)   GP     Van Clines Hamff 04/03/2012, 10:15 AM

## 2012-04-04 ENCOUNTER — Encounter (HOSPITAL_COMMUNITY)
Admission: EM | Disposition: A | Discharge status: Nursing Home (Includes ICF) | Payer: Self-pay | Source: Home / Self Care | Attending: Internal Medicine

## 2012-04-04 ENCOUNTER — Encounter (HOSPITAL_COMMUNITY): Payer: Self-pay | Admitting: Anesthesiology

## 2012-04-04 ENCOUNTER — Inpatient Hospital Stay (HOSPITAL_COMMUNITY): Payer: Medicare Other | Admitting: Anesthesiology

## 2012-04-04 DIAGNOSIS — K565 Intestinal adhesions [bands], unspecified as to partial versus complete obstruction: Secondary | ICD-10-CM

## 2012-04-04 HISTORY — PX: LAPAROTOMY: SHX154

## 2012-04-04 LAB — CBC
Hemoglobin: 11.8 g/dL — ABNORMAL LOW (ref 12.0–15.0)
Hemoglobin: 12.3 g/dL (ref 12.0–15.0)
MCH: 31.1 pg (ref 26.0–34.0)
MCV: 91.3 fL (ref 78.0–100.0)
Platelets: 501 10*3/uL — ABNORMAL HIGH (ref 150–400)
RBC: 3.8 MIL/uL — ABNORMAL LOW (ref 3.87–5.11)
RBC: 4.02 MIL/uL (ref 3.87–5.11)
WBC: 26.2 10*3/uL — ABNORMAL HIGH (ref 4.0–10.5)

## 2012-04-04 LAB — BASIC METABOLIC PANEL
CO2: 25 mEq/L (ref 19–32)
Calcium: 7.8 mg/dL — ABNORMAL LOW (ref 8.4–10.5)
Chloride: 104 mEq/L (ref 96–112)
Creatinine, Ser: 0.64 mg/dL (ref 0.50–1.10)
Glucose, Bld: 81 mg/dL (ref 70–99)

## 2012-04-04 LAB — CREATININE, SERUM
Creatinine, Ser: 0.58 mg/dL (ref 0.50–1.10)
GFR calc Af Amer: 88 mL/min — ABNORMAL LOW (ref >90)

## 2012-04-04 SURGERY — LAPAROTOMY, EXPLORATORY
Anesthesia: General | Site: Abdomen | Wound class: Clean

## 2012-04-04 MED ORDER — LIDOCAINE HCL (CARDIAC) 20 MG/ML IV SOLN
INTRAVENOUS | Status: DC | PRN
  Administered 2012-04-04: 50 mg via INTRAVENOUS

## 2012-04-04 MED ORDER — ROCURONIUM BROMIDE 100 MG/10ML IV SOLN
INTRAVENOUS | Status: DC | PRN
  Administered 2012-04-04: 25 mg via INTRAVENOUS

## 2012-04-04 MED ORDER — ENOXAPARIN SODIUM 30 MG/0.3ML ~~LOC~~ SOLN
30.0000 mg | SUBCUTANEOUS | Status: DC
Start: 2012-04-04 — End: 2012-04-05
  Administered 2012-04-04: 30 mg via SUBCUTANEOUS
  Filled 2012-04-04 (×2): qty 0.3

## 2012-04-04 MED ORDER — NEOSTIGMINE METHYLSULFATE 1 MG/ML IJ SOLN
INTRAMUSCULAR | Status: DC | PRN
  Administered 2012-04-04: 3 mg via INTRAVENOUS

## 2012-04-04 MED ORDER — OXYCODONE HCL 5 MG/5ML PO SOLN: 5.0000 mg | Freq: Once | ORAL | Status: DC | PRN

## 2012-04-04 MED ORDER — POTASSIUM CHLORIDE 10 MEQ/100ML IV SOLN
10.0000 meq | INTRAVENOUS | Status: AC
  Filled 2012-04-04 (×2): qty 100

## 2012-04-04 MED ORDER — PROMETHAZINE HCL 25 MG/ML IJ SOLN: 6.2500 mg | INTRAMUSCULAR | Status: DC | PRN

## 2012-04-04 MED ORDER — SUCCINYLCHOLINE CHLORIDE 20 MG/ML IJ SOLN
INTRAMUSCULAR | Status: DC | PRN
  Administered 2012-04-04: 100 mg via INTRAVENOUS

## 2012-04-04 MED ORDER — WHITE PETROLATUM GEL
Status: AC
  Administered 2012-04-04: 0.2
  Filled 2012-04-04: qty 5

## 2012-04-04 MED ORDER — ONDANSETRON HCL 4 MG/2ML IJ SOLN
4.0000 mg | Freq: Four times a day (QID) | INTRAMUSCULAR | Status: DC | PRN
  Administered 2012-04-06: 4 mg via INTRAVENOUS
  Filled 2012-04-04: qty 2

## 2012-04-04 MED ORDER — MORPHINE SULFATE 2 MG/ML IJ SOLN
1.0000 mg | INTRAMUSCULAR | Status: DC | PRN
  Administered 2012-04-05 – 2012-04-07 (×3): 2 mg via INTRAVENOUS
  Filled 2012-04-04 (×3): qty 1

## 2012-04-04 MED ORDER — PROPOFOL 10 MG/ML IV BOLUS
INTRAVENOUS | Status: DC | PRN
  Administered 2012-04-04: 80 mg via INTRAVENOUS

## 2012-04-04 MED ORDER — OXYCODONE HCL 5 MG PO TABS: 5.0000 mg | ORAL_TABLET | Freq: Once | ORAL | Status: DC | PRN

## 2012-04-04 MED ORDER — 0.9 % SODIUM CHLORIDE (POUR BTL) OPTIME
TOPICAL | Status: DC | PRN
  Administered 2012-04-04 (×2): 1000 mL

## 2012-04-04 MED ORDER — ONDANSETRON HCL 4 MG PO TABS: 4.0000 mg | ORAL_TABLET | Freq: Four times a day (QID) | ORAL | Status: DC | PRN

## 2012-04-04 MED ORDER — ONDANSETRON HCL 4 MG/2ML IJ SOLN
INTRAMUSCULAR | Status: DC | PRN
  Administered 2012-04-04: 4 mg via INTRAVENOUS

## 2012-04-04 MED ORDER — GLYCOPYRROLATE 0.2 MG/ML IJ SOLN
INTRAMUSCULAR | Status: DC | PRN
  Administered 2012-04-04: .4 mg via INTRAVENOUS

## 2012-04-04 MED ORDER — HYDROMORPHONE HCL PF 1 MG/ML IJ SOLN
0.2500 mg | INTRAMUSCULAR | Status: DC | PRN
  Administered 2012-04-04: 0.5 mg via INTRAVENOUS

## 2012-04-04 MED ORDER — HYDROMORPHONE HCL PF 1 MG/ML IJ SOLN
INTRAMUSCULAR | Status: AC
  Filled 2012-04-04: qty 1

## 2012-04-04 MED ORDER — FENTANYL CITRATE 0.05 MG/ML IJ SOLN
INTRAMUSCULAR | Status: DC | PRN
  Administered 2012-04-04: 100 ug via INTRAVENOUS

## 2012-04-04 MED ORDER — LACTATED RINGERS IV SOLN
INTRAVENOUS | Status: DC | PRN
  Administered 2012-04-04: 11:00:00 via INTRAVENOUS

## 2012-04-04 MED ORDER — MEPERIDINE HCL 25 MG/ML IJ SOLN: 6.2500 mg | INTRAMUSCULAR | Status: DC | PRN

## 2012-04-04 SURGICAL SUPPLY — 45 items
BLADE SURG ROTATE 9660 (MISCELLANEOUS) IMPLANT
CANISTER SUCTION 2500CC (MISCELLANEOUS) ×3 IMPLANT
CHLORAPREP W/TINT 26ML (MISCELLANEOUS) ×3 IMPLANT
CLOTH BEACON ORANGE TIMEOUT ST (SAFETY) ×3 IMPLANT
COVER SURGICAL LIGHT HANDLE (MISCELLANEOUS) ×3 IMPLANT
DRAPE LAPAROSCOPIC ABDOMINAL (DRAPES) ×3 IMPLANT
DRAPE UTILITY 15X26 W/TAPE STR (DRAPE) ×6 IMPLANT
DRAPE WARM FLUID 44X44 (DRAPE) ×3 IMPLANT
ELECT CAUTERY BLADE 6.4 (BLADE) ×3 IMPLANT
ELECT REM PT RETURN 9FT ADLT (ELECTROSURGICAL) ×3
ELECTRODE REM PT RTRN 9FT ADLT (ELECTROSURGICAL) ×2 IMPLANT
GLOVE BIO SURGEON STRL SZ7 (GLOVE) ×2 IMPLANT
GLOVE BIO SURGEON STRL SZ7.5 (GLOVE) ×2 IMPLANT
GLOVE BIOGEL PI IND STRL 7.0 (GLOVE) IMPLANT
GLOVE BIOGEL PI IND STRL 7.5 (GLOVE) IMPLANT
GLOVE BIOGEL PI INDICATOR 7.0 (GLOVE) ×1
GLOVE BIOGEL PI INDICATOR 7.5 (GLOVE) ×4
GLOVE ECLIPSE 6.5 STRL STRAW (GLOVE) ×1 IMPLANT
GLOVE ECLIPSE 7.5 STRL STRAW (GLOVE) ×1 IMPLANT
GLOVE EUDERMIC 7 POWDERFREE (GLOVE) ×3 IMPLANT
GOWN PREVENTION PLUS XLARGE (GOWN DISPOSABLE) ×3 IMPLANT
GOWN STRL NON-REIN LRG LVL3 (GOWN DISPOSABLE) ×9 IMPLANT
KIT BASIN OR (CUSTOM PROCEDURE TRAY) ×3 IMPLANT
KIT ROOM TURNOVER OR (KITS) ×3 IMPLANT
LIGASURE IMPACT 36 18CM CVD LR (INSTRUMENTS) IMPLANT
NS IRRIG 1000ML POUR BTL (IV SOLUTION) ×6 IMPLANT
PACK GENERAL/GYN (CUSTOM PROCEDURE TRAY) ×3 IMPLANT
PAD ARMBOARD 7.5X6 YLW CONV (MISCELLANEOUS) ×3 IMPLANT
SPECIMEN JAR X LARGE (MISCELLANEOUS) IMPLANT
SPONGE GAUZE 4X4 12PLY (GAUZE/BANDAGES/DRESSINGS) ×3 IMPLANT
SPONGE LAP 18X18 X RAY DECT (DISPOSABLE) ×1 IMPLANT
STAPLER PROXIMATE 75MM BLUE (STAPLE) ×1 IMPLANT
STAPLER VISISTAT 35W (STAPLE) ×3 IMPLANT
SUCTION POOLE TIP (SUCTIONS) ×3 IMPLANT
SUT PDS AB 1 TP1 54 (SUTURE) ×2 IMPLANT
SUT PDS AB 1 TP1 96 (SUTURE) ×6 IMPLANT
SUT SILK 2 0 SH CR/8 (SUTURE) ×3 IMPLANT
SUT SILK 2 0 TIES 10X30 (SUTURE) ×3 IMPLANT
SUT SILK 3 0 SH CR/8 (SUTURE) ×3 IMPLANT
SUT SILK 3 0 TIES 10X30 (SUTURE) ×3 IMPLANT
TOWEL OR 17X24 6PK STRL BLUE (TOWEL DISPOSABLE) ×3 IMPLANT
TOWEL OR 17X26 10 PK STRL BLUE (TOWEL DISPOSABLE) ×3 IMPLANT
TRAY FOLEY CATH 14FRSI W/METER (CATHETERS) IMPLANT
WATER STERILE IRR 1000ML POUR (IV SOLUTION) IMPLANT
YANKAUER SUCT BULB TIP NO VENT (SUCTIONS) ×1 IMPLANT

## 2012-04-04 NOTE — Progress Notes (Signed)
General surgery attending note:  Patient had a quiet night. Denies bone pain. No stool or flatus. NG drainage bilious. Exam reveals abdomen still distended but soft, tympanitic, nontender.  I spoke with both of her sons by phone yesterday. Both of them will arrive this morning in preparation for her planned abdominal surgery.  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 Presumably this is adhesive do to her prior hysterectomy. Plan to proceed with laparotomy and lysis of adhesions today  GERD. Continue proton pump inhibitors  COPD. Stable on inhalers  PAF  DVT prophylaxis. Will resume Lovenox postop.  Advanced age. Significant increased surgical risk.   Angelia Mould. Derrell Lolling, M.D., [facility] General and Minimally invasive Surgery Breast and Colorectal Surgery Office:   [phone] Pager:   [phone]

## 2012-04-04 NOTE — Anesthesia Preprocedure Evaluation (Addendum)
Anesthesia Evaluation  Patient identified by MRN, date of birth, ID band Patient awake    Reviewed: Allergy & Precautions, H&P , NPO status , Patient's Chart, lab work & pertinent test results  Airway Mallampati: I  Neck ROM: Full    Dental  (+) Upper Dentures   Pulmonary COPD breath sounds clear to auscultation        Cardiovascular + Past MI and +CHF Rhythm:Regular Rate:Normal + Systolic murmurs    Neuro/Psych    GI/Hepatic Bowel prep,GERD-  ,SBO   Endo/Other    Renal/GU Renal disease     Musculoskeletal   Abdominal   Peds  Hematology   Anesthesia Other Findings   Reproductive/Obstetrics                          Anesthesia Physical Anesthesia Plan  ASA: III  Anesthesia Plan: General   Post-op Pain Management:    Induction: Intravenous  Airway Management Planned: Oral ETT  Additional Equipment:   Intra-op Plan:   Post-operative Plan: Extubation in OR and Possible Post-op intubation/ventilation  Informed Consent: I have reviewed the patients History and Physical, chart, labs and discussed the procedure including the risks, benefits and alternatives for the proposed anesthesia with the patient or authorized representative who has indicated his/her understanding and acceptance.   Dental advisory given  Plan Discussed with: CRNA and Surgeon  Anesthesia Plan Comments:        Anesthesia Quick Evaluation

## 2012-04-04 NOTE — Op Note (Addendum)
Patient Name:           Bianca Khan   Date of Surgery:        04/04/2012  Pre op Diagnosis:      Recurrent small bowel obstruction  Post op Diagnosis:    Recurrent small bowel obstruction secondary to adhesions and internal hernia  Procedure:                 Exploratory laparotomy, lysis of adhesions and release of small bowel traction  Surgeon:                     Angelia Mould. Derrell Lolling, M.D., FACS  Assistant:                      Chevis Pretty, M.D., [facility]  Operative Indications:   This is a 78 year old Caucasian female in reasonable health. Mental status is normal. She lives in assisted living. She has a past history of a vaginal hysterectomy although she doesn't remember that. She was recently admitted to the hospital with a small bowel obstruction. It temporarily improved but then recurred and she had to have a nasogastric tube a second time. She temporarily improved and then with the nasogastric tube and then it recurred a third time. I was asked to see her and CT enterography shows 2 tandem strictures in the distal ileum down deep in the pelvis suggesting adhesions in the pelvis. I felt that this was a fixed mechanical obstruction and told the patient and her 2 sons that we would either need to do surgery or consider palliative care. After a brief discussion they all wanted to go ahead with the surgery.  Operative Findings:       The entire small bowel was distended up to the obstructing point in the pelvis. The distal 12 inches of terminal ileum were collapsed.There were adhesions and an internal hernia in the pelvis causing the obstruction. It appears that some appendices epiploica of the sigmoid colon had adhered to the vaginal cuff and created an internal hernia and a loop of small bowel was caught in that. After we dissected that out there was a narrowed area both proximally and distally but the bowel was viable and the strictures  opened up and I could milk small bowel contents easily through both  strictures into the cecum. There was no bleeding and no signs this area needed resection. There was no injury to the colon or to the bladder so we simply  lysed the adhesions to release the obstruction  Procedure in Detail:          Following the induction of general endotracheal anesthesia the patient's abdomen was prepped and draped in a sterile fashion. Intravenous antibiotics were given. Surgical time out was performed. Lower midline incision was made and the fascia was incised in the midline . The abdomen was explored with findings as described above. We carefully took down the adhesions and divided the bands creating the internal hernia. We dissected the small bowel out of the hernia and examined the small bowel on 3 occasions all the way from the ligament of Treitz to the ileocecal valve. We milked small bowel contents through the strictures. There was no serosal tear and no ischemia. We irrigated out the abdomen and pelvis. I returned the small bowel to its anatomic position . I closed the midline fascia with a running suture of #1 PDS and skin with skin staples. The patient was taken  to recovery stable. EBL 30 cc. Counts correct. Complications none.     Angelia Mould. Derrell Lolling, M.D., FACS General and Minimally Invasive Surgery Breast and Colorectal Surgery  04/04/2012 12:39 PM

## 2012-04-04 NOTE — Progress Notes (Signed)
PT Cancellation Note  Treatment cancelled today due to pt. went for surgery.  Please reorder PT when appropriate and if you are agreeable.Ferman Hamming 04/04/2012, 12:52 PM

## 2012-04-04 NOTE — Progress Notes (Signed)
[facility]             Progress Note   Subjective: -Denies any new complaints, awaiting surgery this a.m.  Objective: Vital signs in last 24 hours: Temp:  [97.3 F (36.3 C)-99 F (37.2 C)] 99 F (37.2 C) (10/03 0942) Pulse Rate:  [70-91] 72  (10/03 0942) Resp:  [16-18] 18  (10/03 0942) BP: (107-131)/(57-66) 131/64 mmHg (10/03 0942) SpO2:  [90 %-97 %] 97 % (10/03 0942) Weight:  [71.5 kg (157 lb 10.1 oz)] 71.5 kg (157 lb 10.1 oz) (10/02 2132) Weight change: 40.61 kg (89 lb 8.5 oz) Last BM Date: 04/03/12  Intake/Output from previous day: 10/02 0701 - 10/03 0700 In: 0  Out: 700 [Urine:150; Emesis/NG output:550]     Physical Exam: General: Alert, awake, oriented x3, in no acute distress. HEENT: No bruits, no goiter, NG in place. Heart: Irreegular rate and rhythm, rate controlled. without murmurs, rubs, gallops. Lungs: Clear to auscultation bilaterally. Abdomen: Soft, nontender, slightly distended, decreased bowel sounds. Extremities: No clubbing cyanosis or edema with positive pedal pulses. Neuro: Grossly intact, nonfocal.  Lab Results: Basic Metabolic Panel:  Basename 04/04/12 0650 04/03/12 0630  NA 139 138  K 3.4* 4.0  CL 104 98  CO2 25 22  GLUCOSE 81 120*  BUN 11 8  CREATININE 0.64 0.61  CALCIUM 7.8* 9.1  MG -- --  PHOS -- --   CBC:  Basename 04/04/12 0650  WBC 17.5*  NEUTROABS --  HGB 11.8*  HCT 34.7*  MCV 91.3  PLT 476*   CBG: No results found for this basename: GLUCAP:6 in the last 72 hours   Recent Results (from the past 240 hour(s))  SURGICAL PCR SCREEN     Status: Normal   Collection Time   04/03/12  6:55 PM      Component Value Range Status Comment   MRSA, PCR NEGATIVE  NEGATIVE Final    Staphylococcus aureus NEGATIVE  NEGATIVE Final     Studies/Results: Dg Abd 1 View  04/02/2012  *RADIOLOGY REPORT*  Clinical Data: Partial small bowel obstruction  ABDOMEN - 1 VIEW  Comparison: 03/30/2012  Findings: Interval worsening  gaseous distention of small bowel measuring 3.8 cm in diameter in the left abdomen compatible with a recurrent small bowel obstruction pattern compared to 03/30/2012. Stable chronic calcifications of the pancreatic head and uncinate process better demonstrated by CT.  Postop changes of both hips. Degenerative changes of the spine.  Lung bases remain clear.  IMPRESSION: Worsening small bowel dilatation compatible with recurring small bowel obstruction.   Original Report Authenticated By: Judie Petit. Ruel Favors, M.D.    Ct Entero Abd/pelvis W/cm  04/03/2012  *RADIOLOGY REPORT*  Clinical Data:  Persistent small bowel obstruction.  Nausea.  Pain.  CT ABDOMEN AND PELVIS WITH CONTRAST (CT ENTEROGRAPHY)  Technique:  Multidetector CT of the abdomen and pelvis during bolus administration of intravenous contrast. Negative oral contrast VoLumen was given.  Contrast: 80mL OMNIPAQUE IOHEXOL 300 MG/ML  SOLN  Comparison:  CT from 03/23/2012  Findings: Images which include the lower chest show a small right pleural effusion.  Moderate hiatal hernia is evident.  NG tube tip is in the proximal stomach, tenting the lateral wall of the stomach.  There is no evidence for inner wall or transmural hyperenhancement in the stomach.  The pylorus and duodenal bulb have normal imaging features.  The duodenum is not dilated.  A small bowel dilatation begins just distal to the ligament of Treitz. Small bowel loops are diffusely  fluid-filled and dilated up to 3.4 cm in diameter in the pelvis and 3.7 cm in diameter in the abdomen.  An abrupt transition zone is identified in the posterior right pelvis (see image 62 of series 2).  There is some subtly increased transmural enhancement in this region which may be related to the collapsed bowel has imaging features are not entirely suggestive of an inflammatory stricture.  Distal to this transition zone, there is a loop of small bowel in the cul-de-sac which is dilated up to about 2.9 cm.  This appears to  leading to a second transition zone involving the distal ileum, about 15-25 cm proximal to the ileocecal valve.  The distal ileum is completely decompressed.  The colon is decompressed throughout.  No focal abnormalities seen in the liver.  There is some mild periportal edema.  Spleen is unremarkable.  There appear to be years some dystrophic calcification in the uncinate process and pancreatic head.  Adrenal glands are unremarkable.  There is cortical scarring in both kidneys.  No abdominal aortic aneurysm.  The celiac axis and superior mesenteric artery opacify as does the inferior mesenteric artery. The superior mesenteric vein and portal vein are patent.  Imaging through the pelvis shows a small amount of free fluid in the cul-de-sac.  Bladder is unremarkable.  Uterus is surgically absent.  There is no adnexal mass.  The terminal ileum is decompressed.  The appendix is normal.  The patient is status post pin placement in the left femoral neck and plate and screw fixation of the right femoral neck.  Bones are diffusely demineralized.  IMPRESSION:  Imaging features consistent with distal small bowel obstruction. There is abrupt transition zone in the posterior lower right pelvis.  This is a relatively short transition zone before extending into another fluid filled dilated short segment of small bowel which leads into a second transition point.  The distal and terminal ileum beyond the second transition zone are completely collapsed as is the colon.  The presence of two transition zones raises the possibility of closed loop obstruction.  There is a small amount of free fluid in cul-de-sac.   Original Report Authenticated By: ERIC A. MANSELL, M.D.    Dg Chest Port 1 View  04/03/2012  *RADIOLOGY REPORT*  Clinical Data: Preoperative respiratory evaluation for colon surgery  PORTABLE CHEST - 1 VIEW  Comparison: 03/23/2012  Findings: 1906 hours.  Hyperexpansion is consistent with emphysema. The cardiopericardial  silhouette is enlarged. The NG tube passes into the stomach although the distal tip position is not included on the film. The lungs are clear without focal infiltrate, edema, pneumothorax or pleural effusion.  Old posterior right rib fractures noted.  Bones are diffusely demineralized.  IMPRESSION: Emphysema with interstitial coarsening.  No acute cardiopulmonary process.   Original Report Authenticated By: ERIC A. MANSELL, M.D.     Medications: Scheduled Meds:    . cefOXitin  2 g Intravenous 60 min Pre-Op  . chlorhexidine  1 application Topical Once  . diltiazem  240 mg Oral Daily  . pantoprazole (PROTONIX) IV  40 mg Intravenous Q24H  . potassium chloride  10 mEq Intravenous Q1 Hr x 2  . tiotropium  18 mcg Inhalation Daily   Continuous Infusions:    . sodium chloride 1,000 mL (04/03/12 1942)   PRN Meds:.albuterol, morphine injection, ondansetron (ZOFRAN) IV, ondansetron, promethazine, ranitidine, simethicone  Assessment/Plan:  Principal Problem:  *Partial small bowel obstruction Active Problems:  Nausea and vomiting  Dehydration  Hypokalemia  Leukocytosis  COPD (chronic obstructive pulmonary disease)  Hypertension  Acid reflux  ARF (acute renal failure)   PSBO -NGT remains in place - CT enterography  10/1 -with 2 transition zones  Raising the possibility of closed loop instructions, the distal and terminal ileum beyond the second transition zone are completely collapsed as is the colon. -Discussed with surgery PA early 10/2, patient  For exploratory laparotomy this a.m. - she has been very slow to improve -diet was advanced to regular 9/25, but with vomiting and nausea since, then made NPO again, restarted clears 9/25  -tolerated clears again 9/25, was advanced to full liq 9/26 --Was finally starting to turn the corner over the weekend, was seen by [facility] and slowly restarted on diet, but with vomiting again  KUB  Was reapeated and UGI with small bowel follow through  checked.  ARF -2/2 prerenal azotemia with GI losses. -Resolved with IVF. -Creatinine within normal limits.  Hypokalemia and hypomagnesemia -2/2 GI losses and NG suction. -replace k  Chronic Leukocytosis -Followed by Dr. Clelia Croft (heme/onc)  -May have early myeloproliferative disorder.  -WBC's down to baseline for her; probably demargination/stress  Due to acute dehydration -Only observation at this point  GERD  -Continue Protonix.   COPD  -Stable.  -Continue inhalers.  DVT Prophylaxis  -Continue Lovenox.   P afib: Plan is tonot resume pradaxa at DC due to age, GFR, ASA 325mg  at DC - Dispo: back to ALF when better.        LOS: 12 days   Kathe Wirick C [facility] Pager: [phone] 04/04/2012, 10:39 AM

## 2012-04-04 NOTE — Transfer of Care (Signed)
Immediate Anesthesia Transfer of Care Note  Patient: Bianca Khan  Procedure(s) Performed: Procedure(s) (LRB) with comments: EXPLORATORY LAPAROTOMY (N/A) LYSIS OF ADHESION () - for SBO  Patient Location: PACU  Anesthesia Type: General  Level of Consciousness: awake and sedated  Airway & Oxygen Therapy: Patient Spontanous Breathing and Patient connected to face mask oxygen  Post-op Assessment: Report given to PACU RN, Post -op Vital signs reviewed and stable and Patient moving all extremities  Post vital signs: Reviewed and stable  Complications: No apparent anesthesia complications

## 2012-04-04 NOTE — Anesthesia Postprocedure Evaluation (Signed)
  Anesthesia Post-op Note  Patient: Bianca Khan  Procedure(s) Performed: Procedure(s) (LRB) with comments: EXPLORATORY LAPAROTOMY (N/A) LYSIS OF ADHESION () - for SBO  Patient Location: PACU  Anesthesia Type: General  Level of Consciousness: awake  Airway and Oxygen Therapy: Patient Spontanous Breathing  Post-op Pain: mild  Post-op Assessment: Post-op Vital signs reviewed  Post-op Vital Signs: stable  Complications: No apparent anesthesia complications

## 2012-04-04 NOTE — Progress Notes (Signed)
04/04/2012 10:41 AM  Offered to get patient OOB to ambulate or sit in the chair at around 0900 this am.  Pt politely declined, stating she may feel up to it this afternoon.  I explained she was having surgery and now would be an opportune time to do so, and she again declined.  Will continue to monitor patient. Eunice Blase

## 2012-04-04 NOTE — Progress Notes (Signed)
04/04/2012 2:38 PM Pt arrived from pacu,vss, oriented to unit and safety discussed. Mid line incision is cdi. Pt has no complaints of pain. Call bell within reach and bed alarm is on. Will continue to monitor. Celesta Gentile

## 2012-04-04 NOTE — Preoperative (Signed)
Beta Blockers   Reason not to administer Beta Blockers:Not Applicable 

## 2012-04-05 ENCOUNTER — Encounter (HOSPITAL_COMMUNITY): Payer: Self-pay | Admitting: General Surgery

## 2012-04-05 ENCOUNTER — Inpatient Hospital Stay (HOSPITAL_COMMUNITY): Payer: Medicare Other

## 2012-04-05 DIAGNOSIS — I2510 Atherosclerotic heart disease of native coronary artery without angina pectoris: Secondary | ICD-10-CM | POA: Diagnosis present

## 2012-04-05 DIAGNOSIS — I272 Pulmonary hypertension, unspecified: Secondary | ICD-10-CM | POA: Diagnosis present

## 2012-04-05 DIAGNOSIS — D473 Essential (hemorrhagic) thrombocythemia: Secondary | ICD-10-CM | POA: Diagnosis present

## 2012-04-05 DIAGNOSIS — E119 Type 2 diabetes mellitus without complications: Secondary | ICD-10-CM | POA: Diagnosis present

## 2012-04-05 DIAGNOSIS — I5032 Chronic diastolic (congestive) heart failure: Secondary | ICD-10-CM | POA: Diagnosis present

## 2012-04-05 DIAGNOSIS — E876 Hypokalemia: Secondary | ICD-10-CM

## 2012-04-05 DIAGNOSIS — E46 Unspecified protein-calorie malnutrition: Secondary | ICD-10-CM | POA: Diagnosis present

## 2012-04-05 DIAGNOSIS — R000 Tachycardia, unspecified: Secondary | ICD-10-CM | POA: Diagnosis not present

## 2012-04-05 LAB — GLUCOSE, CAPILLARY
Glucose-Capillary: 98 mg/dL (ref 70–99)
Glucose-Capillary: 89 mg/dL (ref 70–99)
Glucose-Capillary: 183 mg/dL — ABNORMAL HIGH (ref 70–99)

## 2012-04-05 LAB — BASIC METABOLIC PANEL
Calcium: 7.4 mg/dL — ABNORMAL LOW (ref 8.4–10.5)
GFR calc Af Amer: 88 mL/min — ABNORMAL LOW (ref >90)
GFR calc non Af Amer: 76 mL/min — ABNORMAL LOW (ref >90)
Sodium: 140 mEq/L (ref 135–145)

## 2012-04-05 LAB — TYPE AND SCREEN: ABO/RH(D): O POS

## 2012-04-05 LAB — CBC
Hemoglobin: 13.6 g/dL (ref 12.0–15.0)
Platelets: 507 10*3/uL — ABNORMAL HIGH (ref 150–400)
Platelets: 452 10*3/uL — ABNORMAL HIGH (ref 150–400)
RBC: 4.3 MIL/uL (ref 3.87–5.11)
RBC: 4.45 MIL/uL (ref 3.87–5.11)
WBC: 23.2 10*3/uL — ABNORMAL HIGH (ref 4.0–10.5)
WBC: 29.2 10*3/uL — ABNORMAL HIGH (ref 4.0–10.5)

## 2012-04-05 MED ORDER — CHLORHEXIDINE GLUCONATE 0.12 % MT SOLN
15.0000 mL | Freq: Two times a day (BID) | OROMUCOSAL | Status: DC
  Administered 2012-04-05 – 2012-04-11 (×12): 15 mL via OROMUCOSAL
  Filled 2012-04-05 (×18): qty 15

## 2012-04-05 MED ORDER — SODIUM CHLORIDE 0.9 % IV BOLUS (SEPSIS)
750.0000 mL | Freq: Once | INTRAVENOUS | Status: AC
  Administered 2012-04-05: 750 mL via INTRAVENOUS

## 2012-04-05 MED ORDER — FAT EMULSION 20 % IV EMUL
250.0000 mL | INTRAVENOUS | Status: AC
  Administered 2012-04-05: 250 mL via INTRAVENOUS
  Filled 2012-04-05: qty 250

## 2012-04-05 MED ORDER — ENOXAPARIN SODIUM 30 MG/0.3ML ~~LOC~~ SOLN
15.0000 mg | SUBCUTANEOUS | Status: DC
  Filled 2012-04-05: qty 0.15

## 2012-04-05 MED ORDER — SODIUM CHLORIDE 0.9 % IJ SOLN
10.0000 mL | INTRAMUSCULAR | Status: DC | PRN
  Administered 2012-04-09 – 2012-04-11 (×3): 10 mL

## 2012-04-05 MED ORDER — METOPROLOL TARTRATE 1 MG/ML IV SOLN
2.5000 mg | Freq: Three times a day (TID) | INTRAVENOUS | Status: DC
  Administered 2012-04-05 – 2012-04-11 (×17): 2.5 mg via INTRAVENOUS
  Filled 2012-04-05 (×22): qty 5

## 2012-04-05 MED ORDER — FAT EMULSION 20 % IV EMUL
250.0000 mL | INTRAVENOUS | Status: DC
  Filled 2012-04-05: qty 250

## 2012-04-05 MED ORDER — PANTOPRAZOLE SODIUM 40 MG IV SOLR
40.0000 mg | Freq: Two times a day (BID) | INTRAVENOUS | Status: DC
  Filled 2012-04-05: qty 40

## 2012-04-05 MED ORDER — COPPER CHLORIDE 0.4 MG/ML IV SOLN
INTRAVENOUS | Status: AC
  Administered 2012-04-05: 18:00:00 via INTRAVENOUS
  Filled 2012-04-05: qty 2000

## 2012-04-05 MED ORDER — SODIUM CHLORIDE 0.9 % IJ SOLN
INTRAMUSCULAR | Status: AC
  Administered 2012-04-05: 10 mL
  Filled 2012-04-05: qty 10

## 2012-04-05 MED ORDER — SODIUM CHLORIDE 0.9 % IJ SOLN
10.0000 mL | Freq: Two times a day (BID) | INTRAMUSCULAR | Status: DC
  Administered 2012-04-05 – 2012-04-07 (×4): 10 mL
  Administered 2012-04-07 – 2012-04-08 (×2): 20 mL
  Filled 2012-04-05: qty 30
  Filled 2012-04-05: qty 10

## 2012-04-05 MED ORDER — POTASSIUM CHLORIDE 10 MEQ/100ML IV SOLN
10.0000 meq | INTRAVENOUS | Status: AC
  Administered 2012-04-05 (×4): 10 meq via INTRAVENOUS
  Filled 2012-04-05: qty 100
  Filled 2012-04-05: qty 200

## 2012-04-05 MED ORDER — SODIUM CHLORIDE 0.9 % IV SOLN: INTRAVENOUS | Status: DC

## 2012-04-05 MED ORDER — BIOTENE DRY MOUTH MT LIQD
15.0000 mL | Freq: Two times a day (BID) | OROMUCOSAL | Status: DC
  Administered 2012-04-06 – 2012-04-11 (×10): 15 mL via OROMUCOSAL

## 2012-04-05 MED ORDER — POTASSIUM CHLORIDE 10 MEQ/50ML IV SOLN
INTRAVENOUS | Status: AC
  Administered 2012-04-05: 10 meq
  Administered 2012-04-05: 17:00:00
  Filled 2012-04-05: qty 100

## 2012-04-05 MED ORDER — ZINC TRACE METAL 1 MG/ML IV SOLN
INTRAVENOUS | Status: DC
  Filled 2012-04-05: qty 1000

## 2012-04-05 MED ORDER — SODIUM CHLORIDE 0.9 % IV SOLN
INTRAVENOUS | Status: DC
  Administered 2012-04-05: 85 mL via INTRAVENOUS
  Administered 2012-04-06: 50 mL via INTRAVENOUS
  Administered 2012-04-09: 10:00:00 via INTRAVENOUS
  Administered 2012-04-10: 1000 mL via INTRAVENOUS

## 2012-04-05 MED ORDER — SODIUM CHLORIDE 0.9 % IV BOLUS (SEPSIS)
250.0000 mL | Freq: Once | INTRAVENOUS | Status: AC
  Administered 2012-04-05: 250 mL via INTRAVENOUS

## 2012-04-05 MED ORDER — PANTOPRAZOLE SODIUM 40 MG IV SOLR
40.0000 mg | Freq: Two times a day (BID) | INTRAVENOUS | Status: DC
  Administered 2012-04-05 – 2012-04-10 (×12): 40 mg via INTRAVENOUS
  Filled 2012-04-05 (×15): qty 40

## 2012-04-05 MED ORDER — INSULIN ASPART 100 UNIT/ML ~~LOC~~ SOLN
0.0000 [IU] | SUBCUTANEOUS | Status: DC
  Administered 2012-04-05 – 2012-04-06 (×2): 2 [IU] via SUBCUTANEOUS
  Administered 2012-04-06: 3 [IU] via SUBCUTANEOUS
  Administered 2012-04-06: 1 [IU] via SUBCUTANEOUS
  Administered 2012-04-06 (×2): 2 [IU] via SUBCUTANEOUS
  Administered 2012-04-06: 3 [IU] via SUBCUTANEOUS
  Administered 2012-04-07 (×2): 1 [IU] via SUBCUTANEOUS

## 2012-04-05 NOTE — Progress Notes (Signed)
PARENTERAL NUTRITION CONSULT NOTE - INITIAL  Pharmacy Consult for TPN Indication: PCM  No Known Allergies  Patient Measurements: Height: 5' 3.5" (161.3 cm) Weight: 72 lb 12 oz (33 kg) IBW/kg (Calculated) : 53.55  Adjusted Body Weight: 33 kg Usual Weight: 33 kg  Vital Signs: Temp: 98 F (36.7 C) (10/04 0753) Temp src: Oral (10/04 0753) BP: 125/79 mmHg (10/04 0723) Pulse Rate: 120  (10/04 0723) Intake/Output from previous day: 10/03 0701 - 10/04 0700 In: 1575 [I.V.:1075; NG/GT:500] Out: 810 [Urine:610; Emesis/NG output:150; Blood:50] Intake/Output from this shift: Total I/O In: -  Out: 275 [Urine:275]  Labs:  [facility] 04/05/12 0500 04/04/12 1525 04/04/12 0650  WBC 23.2* 26.2* 17.5*  HGB 13.0 12.3 11.8*  HCT 38.8 36.3 34.7*  PLT 507* 501* 476*  APTT -- -- --  INR -- -- --     Basename 04/05/12 0500 04/04/12 1707 04/04/12 0650 04/03/12 0630  NA 140 -- 139 138  K 3.0* -- 3.4* 4.0  CL 102 -- 104 98  CO2 21 -- 25 22  GLUCOSE 83 -- 81 120*  BUN 11 -- 11 8  CREATININE 0.57 0.58 0.64 --  LABCREA -- -- -- --  CREAT24HRUR -- -- -- --  CALCIUM 7.4* -- 7.8* 9.1  MG -- -- -- --  PHOS -- -- -- --  PROT -- -- -- --  ALBUMIN -- -- -- --  AST -- -- -- --  ALT -- -- -- --  ALKPHOS -- -- -- --  BILITOT -- -- -- --  BILIDIR -- -- -- --  IBILI -- -- -- --  PREALBUMIN -- -- -- --  TRIG -- -- -- --  CHOLHDL -- -- -- --  CHOL -- -- -- --   Estimated Creatinine Clearance: 21.9 ml/min (by C-G formula based on Cr of 0.57).   No results found for this basename: GLUCAP:3 in the last 72 hours  Medical History: Past Medical History  Diagnosis Date  . COPD (chronic obstructive pulmonary disease)   . CHF (congestive heart failure)   . Edema   . Myocardial infarction   . Anxiety   . Hypertension   . Anemia   . Leukocytosis   . Acid reflux     Insulin Requirements in the past 24 hours:  SSI ordered 10/4  Current Nutrition:  Low Na, hearth healthy  diet  Nutritional needs: [phone] kcal, 30-40g protein, ~1.2 L/day according to RD note 10/2 Clinimix 4.25/25 at 40 ml/hr with lipids MWF averages 1081 kcal and 40.8 g protein/day  Assessment: 78 y/o (only 33 kg) assisted living resident with recurrent small bowel obstruction secondary to adhesions and internal hernia. Ex.lap with LOA 10/3.   GI: Occasional bowel sounds. NG bilious. H/o GERD on IV PPI.   Lytes: K=3 being supplemented with IV K.  Hepatobiliary: Check LFTs in am  ID: Afebrile. WBC 23.2. No abx.  Endo: No CBGs. SSI started.  Pulm: COPD  Cards: HTN, Afib (on Pradaxa PTA),   Neuro: Normal mental status.  Plan:  Adjust Lovenox dose for low body weight 15mg /24h. Start Clinimix 4.25/25 at 60ml/hr. Noted high risk for refeeding. MV/Trace elements MWF due to national backorder. Lipids MWF due to national backorder. TPN in am and then every Mond/Thurs. Decrease IVF rate accordingly.  Merilynn Finland, [employer] 04/05/2012,10:04 AM

## 2012-04-05 NOTE — Progress Notes (Signed)
Pt with decreased urine output MD aware Rizwan, no new orders, nursing will cont to monitor

## 2012-04-05 NOTE — Progress Notes (Signed)
1 Day Post-Op  Subjective: Alert. Mental status normal. Minimal distress. Oriented. Vital signs stable. Adequate urine output.  Potassium 3.0, hemoglobin 13.0. WBC 23,000, significance unknown.  Objective: Vital signs in last 24 hours: Temp:  [97.8 F (36.6 C)-99 F (37.2 C)] 98.2 F (36.8 C) (10/04 0418) Pulse Rate:  [72-112] 109  (10/04 0418) Resp:  [13-25] 25  (10/04 0418) BP: (124-174)/(55-70) 124/62 mmHg (10/04 0418) SpO2:  [91 %-100 %] 95 % (10/04 0418) FiO2 (%):  [100 %] 100 % (10/03 1258) Weight:  [72 lb 12 oz (33 kg)-76 lb 15.1 oz (34.9 kg)] 72 lb 12 oz (33 kg) (10/04 0418) Last BM Date: 04/03/12  Intake/Output from previous day: 10/03 0701 - 10/04 0700 In: 1575 [I.V.:1075; NG/GT:500] Out: 810 [Urine:610; Emesis/NG output:150; Blood:50] Intake/Output this shift: Total I/O In: 500 [NG/GT:500] Out: 275 [Urine:275]  General appearance: minimal distress. Alert. Oriented. Resp: clear to auscultation bilaterally GI: soft. Appropriately tender. Wound clean and intact. Occasional bowel sounds. NG bilious.  Lab Results:  Results for orders placed during the hospital encounter of 03/23/12 (from the past 24 hour(s))  CBC     Status: Abnormal   Collection Time   04/04/12  6:50 AM      Component Value Range   WBC 17.5 (*) 4.0 - 10.5 K/uL   RBC 3.80 (*) 3.87 - 5.11 MIL/uL   Hemoglobin 11.8 (*) 12.0 - 15.0 g/dL   HCT 86.5 (*) 78.4 - 69.6 %   MCV 91.3  78.0 - 100.0 fL   MCH 31.1  26.0 - 34.0 pg   MCHC 34.0  30.0 - 36.0 g/dL   RDW 29.5  28.4 - 13.2 %   Platelets 476 (*) 150 - 400 K/uL  BASIC METABOLIC PANEL     Status: Abnormal   Collection Time   04/04/12  6:50 AM      Component Value Range   Sodium 139  135 - 145 mEq/L   Potassium 3.4 (*) 3.5 - 5.1 mEq/L   Chloride 104  96 - 112 mEq/L   CO2 25  19 - 32 mEq/L   Glucose, Bld 81  70 - 99 mg/dL   BUN 11  6 - 23 mg/dL   Creatinine, Ser 4.40  0.50 - 1.10 mg/dL   Calcium 7.8 (*) 8.4 - 10.5 mg/dL   GFR calc non Af Amer 73  (*) >90 mL/min   GFR calc Af Amer 85 (*) >90 mL/min  CBC     Status: Abnormal   Collection Time   04/04/12  3:25 PM      Component Value Range   WBC 26.2 (*) 4.0 - 10.5 K/uL   RBC 4.02  3.87 - 5.11 MIL/uL   Hemoglobin 12.3  12.0 - 15.0 g/dL   HCT 10.2  72.5 - 36.6 %   MCV 90.3  78.0 - 100.0 fL   MCH 30.6  26.0 - 34.0 pg   MCHC 33.9  30.0 - 36.0 g/dL   RDW 44.0  34.7 - 42.5 %   Platelets 501 (*) 150 - 400 K/uL  CREATININE, SERUM     Status: Abnormal   Collection Time   04/04/12  5:07 PM      Component Value Range   Creatinine, Ser 0.58  0.50 - 1.10 mg/dL   GFR calc non Af Amer 76 (*) >90 mL/min   GFR calc Af Amer 88 (*) >90 mL/min  BASIC METABOLIC PANEL     Status: Abnormal   Collection Time  04/05/12  5:00 AM      Component Value Range   Sodium 140  135 - 145 mEq/L   Potassium 3.0 (*) 3.5 - 5.1 mEq/L   Chloride 102  96 - 112 mEq/L   CO2 21  19 - 32 mEq/L   Glucose, Bld 83  70 - 99 mg/dL   BUN 11  6 - 23 mg/dL   Creatinine, Ser 1.61  0.50 - 1.10 mg/dL   Calcium 7.4 (*) 8.4 - 10.5 mg/dL   GFR calc non Af Amer 76 (*) >90 mL/min   GFR calc Af Amer 88 (*) >90 mL/min  CBC     Status: Abnormal   Collection Time   04/05/12  5:00 AM      Component Value Range   WBC 23.2 (*) 4.0 - 10.5 K/uL   RBC 4.30  3.87 - 5.11 MIL/uL   Hemoglobin 13.0  12.0 - 15.0 g/dL   HCT 09.6  04.5 - 40.9 %   MCV 90.2  78.0 - 100.0 fL   MCH 30.2  26.0 - 34.0 pg   MCHC 33.5  30.0 - 36.0 g/dL   RDW 81.1  91.4 - 78.2 %   Platelets 507 (*) 150 - 400 K/uL     Studies/Results: @[id number] @     . cefOXitin  2 g Intravenous 60 min Pre-Op  . diltiazem  240 mg Oral Daily  . enoxaparin (LOVENOX) injection  30 mg Subcutaneous Q24H  . HYDROmorphone      . pantoprazole (PROTONIX) IV  40 mg Intravenous Q24H  . potassium chloride  10 mEq Intravenous Q1 Hr x 2  . tiotropium  18 mcg Inhalation Daily  . white petrolatum         Assessment/Plan: s/p Procedure(s): EXPLORATORY LAPAROTOMY LYSIS OF  ADHESION  POD #1. Stable. Mobilize out of bed PT and OT consult Push incentive spirometry Protein calorie malnutrition. We'll ask for PICC line and initiation of TNA today.  Hypokalemia. Will treat with intravenous supplements.  Leukocytosis. Significance unknown. Present preop.  COPD. Stable  Hypertension. Controlled    LOS: 13 days    Bianca Khan M. Derrell Lolling, M.D., [facility] General and Minimally invasive Surgery Breast and Colorectal Surgery Office:   [phone] Pager:   [phone]  04/05/2012  . .prob

## 2012-04-05 NOTE — Progress Notes (Signed)
Pt with dark red fluid from NGT, min output, [place] night [facility] notified, no new orders, will cont to monitor

## 2012-04-05 NOTE — Progress Notes (Signed)
[facility] Progress Note [place] TEAM 1 - Stepdown/ICU TEAM   Bianca Khan UJW:[id number] DOB: [birth year] DOA: 03/23/2012 PCP: Enrique Sack, MD  Brief narrative: 78 year old female patient admitted 03/23/2012 with partial small bowel obstruction. This required NG tube decompression and patient apparently did well for 2 days after the NG tube was discontinued but unfortunately redeveloped bowel obstruction. Surgery was consulted. Patient subsequently underwent exploratory laparotomy with lysis of adhesions on 04/04/2012 and was sent to the step down unit for further monitoring and treatment post operatively.  Assessment/Plan: Principal Problem:  *Partial small bowel obstruction s/p EL/LOA *Appreciate surgical team assistance *Still has evidence of postoperative ileus so we'll continue bowel rest with NG tube, IV Protonix *At the recommendation of surgery team and nutritionist we'll begin parenteral nutrition-see below *Continue mobilization  **Since our initial evaluation this morning the patient's NG output has changed from melanotic or bright red blood. I subsequently increased the patient's Protonix from daily to every 12 hours and ask the RN to contact the surgical team for additional orders. In addition because of concerns over suction trauma leading to the bleeding I have asked the RN to place the NG tube gravity drainage temporarily to avoid any further trauma. I placed an order for an abdominal film to make sure the NG tube is in appropriate placement. I am concerned that the tube may be in the distal esophagus. Her blood pressure is also dropping so I requested a stat CBC and type and screen in the event she needs blood products. It is important to note that her hemoglobin has actually increased from 11 to 13 since 04/04/2012 and that is more reflective of volume depletion.  Active Problems:  Dehydration/Hypokalemia *Multifactorial secondary to dehydration prior to  admission as well as continued GI losses from NG tube *Increase IV fluids to 125 cc per hour but will need to be decreased to 50 cc per hour once parenteral nutrition initiated *IV potassium boluses. Hopefully parenteral nutrition will help minimize hypokalemia *Follow electrolyte panel   Tachycardia *Likely related to dehydration but could be related to pain/stress as well as withdrawal from calcium channel blocker *Given a 250 normal saline bolus *Begin scheduled low-dose Lopressor every 8 hours IV *Discontinued Cardizem CD per NG tube since not effective in this setting  Diabetes mellitus type 2, controlled *On metformin prior to admission *N.p.o. so we'll check CBGs every 4 hours and provide sensitive sliding scale insulin   Hypertension *Blood pressure controlled *Holding oral Cardizem as well as Hydro Diuril   CAD (coronary artery disease) on Pradaxa pre-op *Currently no clinical signs of ischemia and patient denies chest pain *Unable to administer Pradaxa because of bowel rest postoperatively for ileus   Protein calorie malnutrition *Appreciate nutrition team evaluation *Have asked pharmacy to manage parenteral nutrition *Have asked IV team to place PICC line to facilitate nutrition as well as administration of IV fluids and to obtain laboratory data   Chronic diastolic congestive heart failure, NYHA class 1 (ECHO 2008) *Currently compensated   Chronic Leukocytosis/Chronic Thrombocytosis *Has been evaluated by hematology in the past - abnormalities felt to be reactive in nature *Certainly has an acute component to the leukocytosis so we'll continue to monitor. Suspect has acute reactive component do to acute surgical procedure   COPD (chronic obstructive pulmonary disease)/Pulmonary HTN - 41 mmHg *Compensated *Was on Spiriva the and Mucinex prior to admission *Continue nebulizers    DVT prophylaxis: Lovenox subcutaneous daily Code Status: Full Family Communication:  Spoke with  patient and her son Disposition Plan: Remain in step  Consultants: General surgery  Procedures: Exploratory laparotomy with lysis of adhesions and release of small bowel traction in setting of recurrent small bowel obstruction on 04/04/2012  Antibiotics: None  HPI/Subjective: Patient is alert and primarily complaining of burning at IV site where intravenous potassium boluses infusing. States has not passed any flatus. Denies surgical . Denies chest pain or shortness of breath.site pain   Objective: Blood pressure 125/79, pulse 120, temperature 98 F (36.7 C), temperature source Oral, resp. rate 29, height 5' 3.5" (1.613 m), weight 33 kg (72 lb 12 oz), SpO2 96.00%.  Intake/Output Summary (Last 24 hours) at 04/05/12 1038 Last data filed at 04/05/12 0754  Gross per 24 hour  Intake   1575 ml  Output   1085 ml  Net    490 ml     Exam: General: No acute respiratory distress Lungs: Clear to auscultation bilaterally without wheezes or crackles Cardiovascular: Regular rate and rhythm without murmur gallop or rub normal S1 and S2, IVF @ 75/hr Abdomen: Nontender, nondistended, soft, bowel sounds absent, no rebound, no ascites, no appreciable mass- NGT to LWS- melenotic appearing returns Extremities: No significant cyanosis, clubbing, or edema bilateral lower extremities  Data Reviewed: Basic Metabolic Panel:  Lab 04/05/12 1610 04/04/12 1707 04/04/12 0650 04/03/12 0630 04/01/12 0509 03/31/12 0635  NA 140 -- 139 138 140 141  K 3.0* -- 3.4* 4.0 2.7* 3.5  CL 102 -- 104 98 103 104  CO2 21 -- 25 22 24  18*  GLUCOSE 83 -- 81 120* 84 52*  BUN 11 -- 11 8 4* 7  CREATININE 0.57 0.58 0.64 0.61 0.58 --  CALCIUM 7.4* -- 7.8* 9.1 7.8* 7.6*  MG -- -- -- -- -- --  PHOS -- -- -- -- -- --   Liver Function Tests: No results found for this basename: AST:5,ALT:5,ALKPHOS:5,BILITOT:5,PROT:5,ALBUMIN:5 in the last 168 hours No results found for this basename: LIPASE:5,AMYLASE:5 in the last  168 hours No results found for this basename: AMMONIA:5 in the last 168 hours CBC:  Lab 04/05/12 0500 04/04/12 1525 04/04/12 0650  WBC 23.2* 26.2* 17.5*  NEUTROABS -- -- --  HGB 13.0 12.3 11.8*  HCT 38.8 36.3 34.7*  MCV 90.2 90.3 91.3  PLT 507* 501* 476*   Cardiac Enzymes: No results found for this basename: CKTOTAL:5,CKMB:5,CKMBINDEX:5,TROPONINI:5 in the last 168 hours BNP (last 3 results) No results found for this basename: PROBNP:3 in the last 8760 hours CBG: No results found for this basename: GLUCAP:5 in the last 168 hours  Recent Results (from the past 240 hour(s))  SURGICAL PCR SCREEN     Status: Normal   Collection Time   04/03/12  6:55 PM      Component Value Range Status Comment   MRSA, PCR NEGATIVE  NEGATIVE Final    Staphylococcus aureus NEGATIVE  NEGATIVE Final      Studies:  Recent x-ray studies have been reviewed in detail by the Attending Physician  Scheduled Meds:  Reviewed in detail by the Attending Physician   Junious Silk, ANP [facility] Office  [phone] Pager [phone]  On-Call/Text Page:      [email or web address]      password [email or web address]  If 7PM-7AM, please contact night-coverage [email or web address] Password [email or web address] 04/05/2012, 10:38 AM   LOS: 13 days   I have examined the patient, reviewed the chart and modified the above note which I agree with.   Calvert Cantor, MD [phone]

## 2012-04-05 NOTE — Progress Notes (Signed)
Nutrition Follow-up  Intervention:   Recommend monitor for refeeding syndrome   Assessment:   Pt is POD # 1 s/p exp lap with LOA. Per MD notes pt with postoperative ileus. Pt with NGT to intermittent suction. Pharmacy consulted to start TPN. Clinimix 4.25/25 @ 40 ml/hr with 20% lipids @ 5 ml/hr will start this PM. (This regimen will provide: 1219 kcal and 40 grams protein with a 7 day average of 1078 kcal and 40 grams protein-100% of needs) MVI/trace elements provided MWF due to national backorder. Lipids provided MWF due to national backorder. Pharmacy will monitor for refeeding syndrome. Labs reviewed; Potassium is low and being repleted.   Diet Order:  NPO  Meds: Scheduled Meds:    . HYDROmorphone      . insulin aspart  0-9 Units Subcutaneous Q4H  . metoprolol  2.5 mg Intravenous Q8H  . pantoprazole (PROTONIX) IV  40 mg Intravenous Q12H  . potassium chloride  10 mEq Intravenous Q1 Hr x 5  . potassium chloride      . sodium chloride  250 mL Intravenous Once  . sodium chloride  750 mL Intravenous Once  . sodium chloride  10-40 mL Intracatheter Q12H  . sodium chloride      . tiotropium  18 mcg Inhalation Daily  . white petrolatum      . DISCONTD: diltiazem  240 mg Oral Daily  . DISCONTD: enoxaparin (LOVENOX) injection  15 mg Subcutaneous Q24H  . DISCONTD: enoxaparin (LOVENOX) injection  30 mg Subcutaneous Q24H  . DISCONTD: pantoprazole (PROTONIX) IV  40 mg Intravenous Q24H  . DISCONTD: pantoprazole (PROTONIX) IV  40 mg Intravenous Q12H   Continuous Infusions:    . sodium chloride 85 mL (04/05/12 1456)  . TPN (CLINIMIX) +/- additives     And  . fat emulsion    . DISCONTD: sodium chloride 75 mL/hr at 04/05/12 0600  . DISCONTD: sodium chloride    . DISCONTD: fat emulsion    . DISCONTD: TPN (CLINIMIX) +/- additives     PRN Meds:.albuterol, morphine injection, ondansetron (ZOFRAN) IV, ondansetron, promethazine, ranitidine, sodium chloride  Labs:  CMP     Component Value  Date/Time   NA 140 04/05/2012 0500   K 3.0* 04/05/2012 0500   CL 102 04/05/2012 0500   CO2 21 04/05/2012 0500   GLUCOSE 83 04/05/2012 0500   BUN 11 04/05/2012 0500   CREATININE 0.57 04/05/2012 0500   CALCIUM 7.4* 04/05/2012 0500   PROT 7.1 03/23/2012 0441   ALBUMIN 3.9 03/23/2012 0441   AST 17 03/23/2012 0441   ALT 8 03/23/2012 0441   ALKPHOS 77 03/23/2012 0441   BILITOT 0.6 03/23/2012 0441   GFRNONAA 76* 04/05/2012 0500   GFRAA 88* 04/05/2012 0500   Sodium  Date/Time Value Range Status  04/05/2012  5:00 AM 140  135 - 145 mEq/L Final  04/04/2012  6:50 AM 139  135 - 145 mEq/L Final  04/03/2012  6:30 AM 138  135 - 145 mEq/L Final    Potassium  Date/Time Value Range Status  04/05/2012  5:00 AM 3.0* 3.5 - 5.1 mEq/L Final  04/04/2012  6:50 AM 3.4* 3.5 - 5.1 mEq/L Final  04/03/2012  6:30 AM 4.0  3.5 - 5.1 mEq/L Final    Phosphorus  Date/Time Value Range Status  04/17/2010  9:50 PM 3.5  2.3 - 4.6 mg/dL Final  11/02/8411  2:44 AM 2.5  2.3 - 4.6 mg/dL Final    Magnesium  Date/Time Value Range Status  03/24/2012 10:19  AM 1.2* 1.5 - 2.5 mg/dL Final  1/61/0960  4:54 AM 1.6  1.5 - 2.5 mg/dL Final  09/81/1914  7:82 AM 1.5  1.5 - 2.5 mg/dL Final    Intake/Output Summary (Last 24 hours) at 04/05/12 1550 Last data filed at 04/05/12 1210  Gross per 24 hour  Intake    800 ml  Output    835 ml  Net    -35 ml   Last BM: 10/2  Weight Status:  Admission wt: 66 lbs 72 lbs 10/4 76 lbs 10/3 68 lbs 10/1  Estimated needs: [phone] kcal, 30-40g protein, ~1.2 L/day  Nutrition Dx:  Inadequate oral intake now r/t altered gi function AEB post op ileus/NPO status.  Goal: Pt to meet >/= 90% of their estimated nutrition needs; progressing  Monitor: weight trends, labs, ability to advance diet  Kendell Bane RD, LDN, CNSC [phone] Pager [phone] After Hours Pager

## 2012-04-05 NOTE — Progress Notes (Signed)
RN called to pt room by son, son stated red color liquid coming from NG tube, RN observered frank red blood from NGT, MD called new orders received, intermittent suction held for further assessment, 1400 RN/N by PA to flush NGT and clear line x 2-3 times, RN unable to clear NGT line fully noted to see dark red in NGT line unable to clear thoroughly after flushing,intermittent suction restored to NGT per telephone order from Surgical PA, Nursing will cont to monitor

## 2012-04-05 NOTE — Evaluation (Signed)
Physical Therapy Evaluation Patient Details Name: Bianca Khan MRN: [id number] DOB: [birth year] Today's Date: 04/05/2012 Time: 4098-1191 PT Time Calculation (min): 21 min  PT Assessment / Plan / Recommendation Clinical Impression  Pt s/p exploratory lap with lysis of ahesion. Pt will benefit from skilled PT in the acute setting to maximize I with functional mobility prior to d/c. Pt must be at baseline ambulation if she is to return to ALF- otherwise, will need SNF at d/c.    PT Assessment  Patient needs continued PT services    Follow Up Recommendations  Home health PT    Barriers to Discharge None      Equipment Recommendations  None recommended by PT    Recommendations for Other Services     Frequency Min 3X/week    Precautions / Restrictions Precautions Precautions: Fall Precaution Comments: h/o falls with hip fractures Restrictions Weight Bearing Restrictions: No Other Position/Activity Restrictions: NGT on wall suction; per Dr. Derrell Lolling, OK to be unconnected intermittently for amb   Pertinent Vitals/Pain       Mobility  Bed Mobility Bed Mobility: Sit to Supine Supine to Sit: 3: Mod assist;HOB flat Details for Bed Mobility Assistance: vc's for technique; truncal assist to decr pain.  Pt bridged to center of bed with Supervision Transfers Transfers: Sit to Stand;Stand to Sit Sit to Stand: 4: Min assist;With armrests;From chair/3-in-1 Stand to Sit: 4: Min assist;With armrests;To chair/3-in-1 Details for Transfer Assistance: supervision for safety, and cues for safe hand placement; noted unsteadiness at initial stand, requiring min assist to steady Ambulation/Gait Ambulation/Gait Assistance: 4: Min assist Ambulation Distance (Feet): 4 Feet (back to bed) Assistive device: None Ambulation/Gait Assistance Details: mildly staggered and weak steps to pivot and walk backward to bed Gait Pattern: Step-to pattern;Shuffle Stairs: No Wheelchair Mobility Wheelchair Mobility:  No    Shoulder Instructions     Exercises     PT Diagnosis: Difficulty walking;Generalized weakness;Acute pain  PT Problem List: Decreased strength;Decreased activity tolerance;Decreased mobility;Pain PT Treatment Interventions: DME instruction;Gait training;Functional mobility training;Therapeutic activities;Balance training;Patient/family education   PT Goals Acute Rehab PT Goals PT Goal Formulation: With patient Time For Goal Achievement: 04/15/12 Potential to Achieve Goals: Good PT Goal: Supine/Side to Sit - Progress: Goal set today PT Goal: Sit to Supine/Side - Progress: Goal set today PT Goal: Sit to Stand - Progress: Goal set today PT Goal: Stand to Sit - Progress: Goal set today Pt will Ambulate: >150 feet;with modified independence;with rolling walker PT Goal: Ambulate - Progress: Goal set today  Visit Information  Last PT Received On: 04/05/12 Assistance Needed: +2 (for lines )    Subjective Data  Subjective: Going back was rougher than getting up this morning Patient Stated Goal: to go back to ALF   Prior Functioning  Home Living Lives With: Alone Available Help at Discharge: Available PRN/intermittently;Skilled Nursing Facility Type of Home: Assisted living Home Access: Level entry Home Layout: One level Bathroom Shower/Tub: Health visitor: Handicapped height Home Adaptive Equipment: Shower chair with back;Grab bars in shower;Grab bars around toilet;Hand-held shower hose;Walker - rolling;Wheelchair - manual;Straight cane;Bedside commode/3-in-1 Additional Comments: used RW for gait PTA and walked to and from dining hall for meals.   Prior Function Level of Independence: Independent with assistive device(s) Driving: No Communication Communication: No difficulties Dominant Hand: Right    Cognition  Overall Cognitive Status: Appears within functional limits for tasks assessed/performed Arousal/Alertness: Awake/alert Orientation Level: Appears  intact for tasks assessed Behavior During Session: [facility] for tasks performed Cognition - Other  Comments: not specifically tested    Extremity/Trunk Assessment Right Upper Extremity Assessment RUE ROM/Strength/Tone: Deficits RUE ROM/Strength/Tone Deficits: right arm was injured in one of her falls, she is unable raise it past 90degrees. She reports there was nofracture, but it looks like she may have injured her rotator cuff on this side RUE Sensation: WFL - Light Touch RUE Coordination: WFL - gross/fine motor Left Upper Extremity Assessment LUE ROM/Strength/Tone: WFL for tasks assessed LUE Sensation: WFL - Light Touch LUE Coordination: WFL - gross/fine motor Right Lower Extremity Assessment RLE ROM/Strength/Tone: WFL for tasks assessed;Deficits RLE ROM/Strength/Tone Deficits: grossly >3/5 Left Lower Extremity Assessment LLE ROM/Strength/Tone: [facility] for tasks assessed;Deficits LLE ROM/Strength/Tone Deficits: grossly >3/5 Trunk Assessment Trunk Assessment: Normal   Balance Balance Balance Assessed: Yes Static Sitting Balance Static Sitting - Balance Support: Right upper extremity supported;Left upper extremity supported;Feet supported Static Sitting - Level of Assistance: 4: Min assist  End of Session PT - End of Session Activity Tolerance: Patient limited by fatigue;Patient limited by pain Patient left: in bed;with call bell/phone within reach Nurse Communication: Mobility status  GP     Paolo Okane, Eliseo Gum 04/05/2012, 1:11 PM  04/05/2012  Stickney Bing, PT [phone] (pager)

## 2012-04-05 NOTE — Evaluation (Signed)
Occupational Therapy Evaluation Patient Details Name: Bianca Khan MRN: [id number] DOB: [birth year] Today's Date: 04/05/2012 Time: 4782-9562 OT Time Calculation (min): 24 min  OT Assessment / Plan / Recommendation Clinical Impression  Pt s/p exploratory lap with lysis of ahesion. Pt will benefit from skilled OT in the acute setting to maximize I with ADL and ADL mobility prior to d/c. Pt must be at baseline ambulation if she is to return to ALF- otherwise, will need SNF at d/c.    OT Assessment  Patient needs continued OT Services    Follow Up Recommendations  Skilled nursing facility    Barriers to Discharge      Equipment Recommendations  None recommended by PT;None recommended by OT    Recommendations for Other Services    Frequency  Min 2X/week    Precautions / Restrictions Precautions Precautions: Fall Restrictions Weight Bearing Restrictions: No   Pertinent Vitals/Pain Pt reports RUE pain but did not rate. Attributes this to receiving potassium this am. RN and NP aware.    ADL  Eating/Feeding: NPO Grooming: Simulated;Set up;Supervision/safety;Wash/dry face Where Assessed - Grooming: Supported sitting Lower Body Bathing: Simulated;Moderate assistance Where Assessed - Lower Body Bathing: Supported sit to stand Lower Body Dressing: Simulated;Moderate assistance Where Assessed - Lower Body Dressing: Supported sit to Pharmacist, hospital: Mining engineer Method: Surveyor, minerals:  (to/from chair) Toileting - Architect and Hygiene: Simulated;Moderate assistance Where Assessed - Toileting Clothing Manipulation and Hygiene: Standing Equipment Used: Gait belt;Rolling walker Transfers/Ambulation Related to ADLs: Min A with ambulation ~20ft ADL Comments: Pt with painful LUE after IV postassium- MD to change IV.    OT Diagnosis: Generalized weakness;Acute pain  OT Problem List: Decreased activity  tolerance;Decreased range of motion;Decreased strength;Impaired balance (sitting and/or standing);Decreased knowledge of use of DME or AE;Decreased knowledge of precautions;Pain;Cardiopulmonary status limiting activity OT Treatment Interventions: Self-care/ADL training;Energy conservation;Therapeutic activities;Patient/family education;Balance training   OT Goals Acute Rehab OT Goals OT Goal Formulation: With patient Time For Goal Achievement: 04/19/12 Potential to Achieve Goals: Good ADL Goals Pt Will Perform Grooming: with supervision;Standing at sink ADL Goal: Grooming - Progress: Goal set today Pt Will Perform Lower Body Bathing: with supervision;Sit to stand in shower;Sit to stand from chair ADL Goal: Lower Body Bathing - Progress: Goal set today Pt Will Perform Upper Body Dressing: with set-up;Sitting, bed;Sitting, chair ADL Goal: Upper Body Dressing - Progress: Goal set today Pt Will Perform Lower Body Dressing: with supervision;Sit to stand from bed;Sit to stand from chair;with set-up ADL Goal: Lower Body Dressing - Progress: Goal set today Pt Will Transfer to Toilet: with supervision;Ambulation;with DME ADL Goal: Toilet Transfer - Progress: Goal set today Pt Will Perform Toileting - Clothing Manipulation: with supervision;Sitting on 3-in-1 or toilet;Standing ADL Goal: Toileting - Clothing Manipulation - Progress: Goal set today Pt Will Perform Tub/Shower Transfer: Shower transfer;with min assist;Ambulation;with DME ADL Goal: Tub/Shower Transfer - Progress: Goal set today Additional ADL Goal #1: Pt will tolerate functional activities taking less than 3, rest breaks as needed.  ADL Goal: Additional Goal #1 - Progress: Goal set today  Visit Information  Last OT Received On: 04/05/12 Assistance Needed: +1    Subjective Data  Subjective: I can't do much today Patient Stated Goal: Return to ALF   Prior Functioning     Home Living Lives With: Alone Available Help  at Discharge: Available PRN/intermittently;Skilled Nursing Facility (ALF) Type of Home: Assisted living Home Access: Level entry Home Layout: One level Bathroom Shower/Tub: Walk-in  shower Bathroom Toilet: Handicapped height Home Adaptive Equipment: Shower chair with back;Grab bars in shower;Grab bars around toilet;Hand-held shower hose;Walker - rolling;Wheelchair - manual;Straight cane;Bedside commode/3-in-1 Additional Comments: used RW for gait PTA and walked to and from dining hall for meals.   Prior Function Level of Independence: Independent with assistive device(s) Driving: No Communication Communication: No difficulties Dominant Hand: Right         Vision/Perception     Cognition  Overall Cognitive Status: Appears within functional limits for tasks assessed/performed Arousal/Alertness: Awake/alert Orientation Level: Appears intact for tasks assessed Behavior During Session: [facility] for tasks performed    Extremity/Trunk Assessment Right Upper Extremity Assessment RUE ROM/Strength/Tone: Deficits RUE ROM/Strength/Tone Deficits: right arm was injured in one of her falls, she is unable raise it past 90degrees. She reports there was nofracture, but it looks like she may have injured her rotator cuff on this side RUE Sensation: WFL - Light Touch RUE Coordination: WFL - gross/fine motor Left Upper Extremity Assessment LUE ROM/Strength/Tone: WFL for tasks assessed LUE Sensation: WFL - Light Touch LUE Coordination: WFL - gross/fine motor     Mobility Bed Mobility Bed Mobility: Not assessed Transfers Sit to Stand: 4: Min assist;With armrests;From chair/3-in-1 Stand to Sit: 4: Min assist;With armrests;To chair/3-in-1                    End of Session OT - End of Session Equipment Utilized During Treatment: Gait belt Activity Tolerance: Patient limited by pain Patient left: in chair;with call bell/phone within reach;with family/visitor present Nurse Communication:  Mobility status  GO     Shaquia Berkley 04/05/2012, 11:28 AM

## 2012-04-05 NOTE — Progress Notes (Signed)
Clinical Social Work  CSW called ALF ([facility]) and updated facility on patient's progression. ALF still agreeable to accept patient as long as she is at baseline for ambulating. CSW agreed to keep facility updated regarding dc plans.  [place], [place] [phone]

## 2012-04-05 NOTE — Progress Notes (Signed)
Peripherally Inserted Central Catheter/Midline Placement  The IV Nurse has discussed with the patient and/or persons authorized to consent for the patient, the purpose of this procedure and the potential benefits and risks involved with this procedure.  The benefits include less needle sticks, lab draws from the catheter and patient may be discharged home with the catheter.  Risks include, but not limited to, infection, bleeding, blood clot (thrombus formation), and puncture of an artery; nerve damage and irregular heat beat.  Alternatives to this procedure were also discussed.  PICC/Midline Placement Documentation  PICC Triple Lumen 04/05/12 PICC Right Basilic (Active)       Ethelda Chick 04/05/2012, 2:46 PM

## 2012-04-06 DIAGNOSIS — E46 Unspecified protein-calorie malnutrition: Secondary | ICD-10-CM

## 2012-04-06 DIAGNOSIS — E119 Type 2 diabetes mellitus without complications: Secondary | ICD-10-CM

## 2012-04-06 LAB — COMPREHENSIVE METABOLIC PANEL
AST: 14 U/L (ref 0–37)
Albumin: 2.2 g/dL — ABNORMAL LOW (ref 3.5–5.2)
Alkaline Phosphatase: 68 U/L (ref 39–117)
Chloride: 105 mEq/L (ref 96–112)
Potassium: 3.2 mEq/L — ABNORMAL LOW (ref 3.5–5.1)
Total Bilirubin: 0.4 mg/dL (ref 0.3–1.2)

## 2012-04-06 LAB — CBC
HCT: 35.7 % — ABNORMAL LOW (ref 36.0–46.0)
Platelets: 477 10*3/uL — ABNORMAL HIGH (ref 150–400)
RDW: 13.1 % (ref 11.5–15.5)
WBC: 20.9 10*3/uL — ABNORMAL HIGH (ref 4.0–10.5)

## 2012-04-06 LAB — GLUCOSE, CAPILLARY
Glucose-Capillary: 206 mg/dL — ABNORMAL HIGH (ref 70–99)
Glucose-Capillary: 199 mg/dL — ABNORMAL HIGH (ref 70–99)
Glucose-Capillary: 137 mg/dL — ABNORMAL HIGH (ref 70–99)

## 2012-04-06 MED ORDER — CLINIMIX E/DEXTROSE (4.25/25) 4.25 % IV SOLN
INTRAVENOUS | Status: AC
  Administered 2012-04-06: 18:00:00 via INTRAVENOUS
  Filled 2012-04-06: qty 2000

## 2012-04-06 MED ORDER — MAGNESIUM SULFATE 40 MG/ML IJ SOLN
4.0000 g | Freq: Once | INTRAMUSCULAR | Status: AC
  Administered 2012-04-06: 4 g via INTRAVENOUS
  Filled 2012-04-06: qty 100

## 2012-04-06 MED ORDER — POTASSIUM PHOSPHATE DIBASIC 3 MMOLE/ML IV SOLN
30.0000 mmol | Freq: Once | INTRAVENOUS | Status: AC
  Administered 2012-04-06: 30 mmol via INTRAVENOUS
  Filled 2012-04-06: qty 10

## 2012-04-06 NOTE — Progress Notes (Signed)
[facility] Progress Note [place] TEAM 1 - Stepdown/ICU TEAM   Bianca Khan ZOX:[id number] DOB: 07-22-1914 DOA: 03/23/2012 PCP: Enrique Sack, MD  Brief narrative: 78 year old female patient admitted 03/23/2012 with partial small bowel obstruction. This required NG tube decompression and patient apparently did well for 2 days after the NG tube was discontinued but unfortunately redeveloped bowel obstruction. Surgery was consulted. Patient subsequently underwent exploratory laparotomy with lysis of adhesions on 04/04/2012 and was sent to the step down unit for further monitoring and treatment post operatively.  Assessment/Plan: Principal Problem:  *Partial small bowel obstruction s/p surgery *Appreciate surgical team assistance *Still has evidence of postoperative ileus so we'll continue bowel rest with NG tube, IV Protonix *Continue mobilization  Blood per NG Tube Could have been NG induced trauma. No known history of PUD. Bleeding has stopped. Continue to monitor. PPI. If she has recurrence of bleeding, she will need GI evaluation. Hgb is stable.  Active Problems:  Dehydration/Hypokalemia *Multifactorial secondary to dehydration prior to admission as well as continued GI losses from NG tube *IV potassium boluses. Hopefully parenteral nutrition will help minimize hypokalemia *Follow electrolyte panel   Tachycardia *Likely related to dehydration but could be related to pain/stress as well as withdrawal from calcium channel blocker *On scheduled low-dose Lopressor every 8 hours IV *Discontinued Cardizem CD per NG tube since not effective in this setting  Diabetes mellitus type 2, controlled *On metformin prior to admission *N.p.o. so we'll check CBGs every 4 hours and provide sensitive sliding scale insulin   Hypertension *Blood pressure controlled *Holding oral Cardizem as well as Hydro Diuril   CAD (coronary artery disease) on Pradaxa pre-op *Currently no clinical  signs of ischemia and patient denies chest pain *Unable to administer Pradaxa because of bowel rest postoperatively for ileus   Protein calorie malnutrition *Appreciate nutrition team evaluation *Have asked pharmacy to manage parenteral nutrition *Have asked IV team to place PICC line to facilitate nutrition as well as administration of IV fluids and to obtain laboratory data   Chronic diastolic congestive heart failure, NYHA class 1 (ECHO 2008) *Currently compensated   Chronic Leukocytosis/Chronic Thrombocytosis *Has been evaluated by hematology in the past - abnormalities felt to be reactive in nature *Certainly has an acute component to the leukocytosis so we'll continue to monitor. Suspect has acute reactive component do to acute surgical procedure   COPD (chronic obstructive pulmonary disease)/Pulmonary HTN - 41 mmHg *Compensated *Was on Spiriva the and Mucinex prior to admission *Continue nebulizers  DVT prophylaxis: Lovenox subcutaneous daily Code Status: Full Family Communication: Spoke with patient Disposition Plan: Remain in stepdown  Consultants: General surgery  Procedures: Exploratory laparotomy with lysis of adhesions and release of small bowel traction in setting of recurrent small bowel obstruction on 04/04/2012  Antibiotics: None  HPI/Subjective: Patient feels fine. Complains of pain in abdomen only with movement. No nausea.    Objective: Blood pressure 134/63, pulse 98, temperature 97.7 F (36.5 C), temperature source Oral, resp. rate 21, height 5' 3.5" (1.613 m), weight 33.7 kg (74 lb 4.7 oz), SpO2 96.00%.  Intake/Output Summary (Last 24 hours) at 04/06/12 1259 Last data filed at 04/06/12 1129  Gross per 24 hour  Intake 2513.92 ml  Output   1175 ml  Net 1338.92 ml     Exam: General: No acute respiratory distress Lungs: Clear to auscultation bilaterally without wheezes or crackles Cardiovascular: Regular rate and rhythm without murmur gallop or  rub  Abdomen: Nontender, nondistended, soft, bowel sounds absent, no rebound,  no ascites, no appreciable mass- NGT to [facility]- melenotic appearing returns Extremities: No significant cyanosis, clubbing, or edema bilateral lower extremities  Data Reviewed: Basic Metabolic Panel:  Lab 04/06/12 9147 04/05/12 0500 04/04/12 1707 04/04/12 0650 04/03/12 0630 04/01/12 0509  NA 139 140 -- 139 138 140  K 3.2* 3.0* -- 3.4* 4.0 2.7*  CL 105 102 -- 104 98 103  CO2 25 21 -- 25 22 24   GLUCOSE 232* 83 -- 81 120* 84  BUN 11 11 -- 11 8 4*  CREATININE 0.55 0.57 0.58 0.64 0.61 --  CALCIUM 7.1* 7.4* -- 7.8* 9.1 7.8*  MG 1.1* -- -- -- -- --  PHOS 1.6* -- -- -- -- --   Liver Function Tests:  Lab 04/06/12 0355  AST 14  ALT 7  ALKPHOS 68  BILITOT 0.4  PROT 4.7*  ALBUMIN 2.2*   No results found for this basename: LIPASE:5,AMYLASE:5 in the last 168 hours No results found for this basename: AMMONIA:5 in the last 168 hours CBC:  Lab 04/06/12 0355 04/05/12 1342 04/05/12 0500 04/04/12 1525 04/04/12 0650  WBC 20.9* 29.2* 23.2* 26.2* 17.5*  NEUTROABS -- -- -- -- --  HGB 12.1 13.6 13.0 12.3 11.8*  HCT 35.7* 40.7 38.8 36.3 34.7*  MCV 90.2 91.5 90.2 90.3 91.3  PLT 477* 452* 507* 501* 476*   Cardiac Enzymes: No results found for this basename: CKTOTAL:5,CKMB:5,CKMBINDEX:5,TROPONINI:5 in the last 168 hours BNP (last 3 results) No results found for this basename: PROBNP:3 in the last 8760 hours CBG:  Lab 04/06/12 1152 04/06/12 0710 04/06/12 0334 04/05/12 2323 04/05/12 2009  GLUCAP 199* 195* 220* 206* 183*    Recent Results (from the past 240 hour(s))  SURGICAL PCR SCREEN     Status: Normal   Collection Time   04/03/12  6:55 PM      Component Value Range Status Comment   MRSA, PCR NEGATIVE  NEGATIVE Final    Staphylococcus aureus NEGATIVE  NEGATIVE Final      Bianca Khan 04/06/[phone] (Pager)  On-Call/Text Page:      [email or web address]      password [email or web address]  If 7PM-7AM, please contact  night-coverage [email or web address] Password [email or web address] 04/06/2012, 12:59 PM   LOS: 14 days

## 2012-04-06 NOTE — Progress Notes (Signed)
2 Days Post-Op  Subjective: No complaints. She says she got her best night sleep last night.  Objective: Vital signs in last 24 hours: Temp:  [97.6 F (36.4 C)-98.5 F (36.9 C)] 97.8 F (36.6 C) (10/05 0711) Pulse Rate:  [105-124] 113  (10/05 0400) Resp:  [21-26] 24  (10/05 0400) BP: (96-138)/(58-89) 113/62 mmHg (10/05 0400) SpO2:  [94 %-98 %] 94 % (10/05 0912) Weight:  [74 lb 4.7 oz (33.7 kg)] 74 lb 4.7 oz (33.7 kg) (10/05 0400) Last BM Date: 04/03/12  Intake/Output from previous day: 10/04 0701 - 10/05 0700 In: 2088.9 [I.V.:1360; NG/GT:160; IV Piggyback:10; TPN:558.9] Out: 1385 [Urine:1085; Emesis/NG output:300] Intake/Output this shift: Total I/O In: -  Out: 25 [Urine:25]  Resp: clear to auscultation bilaterally GI: soft, nontender. quiet. dressing clean  Lab Results:   Basename 04/06/12 0355 04/05/12 1342  WBC 20.9* 29.2*  HGB 12.1 13.6  HCT 35.7* 40.7  PLT 477* 452*   BMET  Basename 04/06/12 0355 04/05/12 0500  NA 139 140  K 3.2* 3.0*  CL 105 102  CO2 25 21  GLUCOSE 232* 83  BUN 11 11  CREATININE 0.55 0.57  CALCIUM 7.1* 7.4*   PT/INR No results found for this basename: LABPROT:2,INR:2 in the last 72 hours ABG No results found for this basename: PHART:2,PCO2:2,PO2:2,HCO3:2 in the last 72 hours  Studies/Results: Dg Abd Portable 1v  04/05/2012  *RADIOLOGY REPORT*  Clinical Data: Evaluate nasogastric tube placement.  PORTABLE ABDOMEN - 1 VIEW  Comparison: 04/02/2012  Findings: The nasogastric tube is in the stomach body region.  The side hole of the nasogastric tube is near the GE junction.  The degree of small bowel distention has markedly decreased.  There continues to be some dilated loops of bowel in the left upper quadrant.   Patient has undergone abdominal surgery and skin staples are present.  Postoperative changes in both hips. Difficult to evaluate for free air on this examination.  Lucency in the right upper abdomen could be related to recent  surgery.  IMPRESSION: Nasogastric tube in the stomach body region.  Nonspecific bowel gas pattern.  Few dilated loops of bowel in the left upper quadrant.   Original Report Authenticated By: Richarda Overlie, M.D.     Anti-infectives: Anti-infectives     Start     Dose/Rate Route Frequency Ordered Stop   04/04/12 0600   cefOXitin (MEFOXIN) 2 g in dextrose 5 % 50 mL IVPB        2 g 100 mL/hr over 30 Minutes Intravenous 60 min pre-op 04/03/12 1419 04/04/12 1204          Assessment/Plan: s/p Procedure(s) (LRB) with comments: EXPLORATORY LAPAROTOMY (N/A) LYSIS OF ADHESION () - for SBO Continue ng and bowel rest TPN for nutrition support  LOS: 14 days    TOTH III,Bianca Khan S 04/06/2012

## 2012-04-06 NOTE — Progress Notes (Signed)
PARENTERAL NUTRITION CONSULT NOTE - F/U  Pharmacy Consult for TPN Indication: PCM  No Known Allergies  Patient Measurements: Height: 5' 3.5" (161.3 cm) Weight: 74 lb 4.7 oz (33.7 kg) IBW/kg (Calculated) : 53.55  Adjusted Body Weight: 33 kg Usual Weight: 33 kg  Vital Signs: Temp: 97.8 F (36.6 C) (10/05 0711) Temp src: Oral (10/05 0711) BP: 113/62 mmHg (10/05 0400) Pulse Rate: 113  (10/05 0400) Intake/Output from previous day: 10/04 0701 - 10/05 0700 In: 2088.9 [I.V.:1360; NG/GT:160; IV Piggyback:10; TPN:558.9] Out: 1385 [Urine:1085; Emesis/NG output:300] Intake/Output from this shift: Total I/O In: -  Out: 25 [Urine:25]  Labs:  [facility] 04/06/12 0355 04/05/12 1342 04/05/12 0500  WBC 20.9* 29.2* 23.2*  HGB 12.1 13.6 13.0  HCT 35.7* 40.7 38.8  PLT 477* 452* 507*  APTT -- -- --  INR -- -- --     Basename 04/06/12 0355 04/05/12 0500 04/04/12 1707 04/04/12 0650  NA 139 140 -- 139  K 3.2* 3.0* -- 3.4*  CL 105 102 -- 104  CO2 25 21 -- 25  GLUCOSE 232* 83 -- 81  BUN 11 11 -- 11  CREATININE 0.55 0.57 0.58 --  LABCREA -- -- -- --  CREAT24HRUR -- -- -- --  CALCIUM 7.1* 7.4* -- 7.8*  MG 1.1* -- -- --  PHOS 1.6* -- -- --  PROT 4.7* -- -- --  ALBUMIN 2.2* -- -- --  AST 14 -- -- --  ALT 7 -- -- --  ALKPHOS 68 -- -- --  BILITOT 0.4 -- -- --  BILIDIR -- -- -- --  IBILI -- -- -- --  PREALBUMIN -- -- -- --  TRIG 99 -- -- --  CHOLHDL -- -- -- --  CHOL -- -- -- --   Estimated Creatinine Clearance: 22.4 ml/min (by C-G formula based on Cr of 0.55).    Basename 04/06/12 0710 04/06/12 0334 04/05/12 2323  GLUCAP 195* 220* 206*    Medical History: Past Medical History  Diagnosis Date  . COPD (chronic obstructive pulmonary disease)   . CHF (congestive heart failure)   . Edema   . Myocardial infarction   . Anxiety   . Hypertension   . Anemia   . Leukocytosis   . Acid reflux     Insulin Requirements in the past 24 hours:  10 units SSI since TPN started last  PM CBGs 183,206,220,195  Current Nutrition:  Low Na, hearth healthy diet  Nutritional needs: [phone] kcal, 30-40g protein, ~1.2 L/day according to RD note 10/4 Clinimix 4.25/25 at 40 ml/hr with lipids MWF averages 1081 kcal and 40.8 g protein/day  Assessment: 78 y/o (only 33 kg) assisted living resident with recurrent small bowel obstruction secondary to adhesions and internal hernia. Ex.lap with LOA 10/3. High risk of refeeding syndrome.  GI: Occasional bowel sounds. NG bilious. H/o GERD on IV PPI. Emesis/NG out 300/24h. NP note indicates the drainage is bloody due to suction trauma.  Anticoag: Adjusted Lovenox dose 10/4 for low body weight 15mg /24h. Note current bloody NG output. Hgb 13.6 to 12.1 today (may be dilutional with +I/O)  Lytes: K=3 ( 5 K runs) to 3.2 today being supplemented with IV K.  Phos low at 1.6 (Kphos ) andMg low at 1.1 (Mag 4g ordered) demonstrates refeeding syndrome.  Hepatobiliary: Check LFTs in am  ID: Afebrile. WBC 23.2. No abx.  Endo: CBGs elevated on SSI with TPN. Will add insulin to TPN  Renal: Scr 0.55. I/O +703.9. RN noted decreased UOP 10/4 pm.  Pulm: COPD on Spiriva  Cards: HTN, Afib (on Pradaxa PTA), 113/62 with elevated HRs 113 this am on IV metoprolol  Neuro: Normal mental status.  Plan:  Clinimix 4.25/25 at 21ml/hr. This is her goal rate. Noted refeeding. Add insulin to TPN MV/Trace elements MWF due to national backorder. Lipids MWF due to national backorder. Labs in am to determine needs for electrolytes suppl. Decrease IVF to 6ml/hr due to NP note 10/4.   Montia Haslip S. Merilynn Finland, PharmD, BCPS Clinical Staff Pharmacist Pager [phone]  Misty Stanley Stillinger 04/06/2012,9:47 AM

## 2012-04-07 LAB — CBC
Hemoglobin: 11.3 g/dL — ABNORMAL LOW (ref 12.0–15.0)
MCHC: 34 g/dL (ref 30.0–36.0)
Platelets: 430 10*3/uL — ABNORMAL HIGH (ref 150–400)

## 2012-04-07 LAB — PHOSPHORUS: Phosphorus: 2.2 mg/dL — ABNORMAL LOW (ref 2.3–4.6)

## 2012-04-07 LAB — BASIC METABOLIC PANEL
CO2: 27 mEq/L (ref 19–32)
Calcium: 7 mg/dL — ABNORMAL LOW (ref 8.4–10.5)
Chloride: 104 mEq/L (ref 96–112)
Sodium: 138 mEq/L (ref 135–145)

## 2012-04-07 LAB — GLUCOSE, CAPILLARY
Glucose-Capillary: 154 mg/dL — ABNORMAL HIGH (ref 70–99)
Glucose-Capillary: 202 mg/dL — ABNORMAL HIGH (ref 70–99)

## 2012-04-07 LAB — MAGNESIUM: Magnesium: 1.8 mg/dL (ref 1.5–2.5)

## 2012-04-07 MED ORDER — POTASSIUM CHLORIDE 10 MEQ/50ML IV SOLN
INTRAVENOUS | Status: AC
  Filled 2012-04-07: qty 150

## 2012-04-07 MED ORDER — INSULIN ASPART 100 UNIT/ML ~~LOC~~ SOLN
0.0000 [IU] | Freq: Three times a day (TID) | SUBCUTANEOUS | Status: DC
  Administered 2012-04-07: 3 [IU] via SUBCUTANEOUS
  Administered 2012-04-08: 2 [IU] via SUBCUTANEOUS

## 2012-04-07 MED ORDER — POTASSIUM CHLORIDE 10 MEQ/100ML IV SOLN
10.0000 meq | INTRAVENOUS | Status: DC
  Administered 2012-04-07: 10 meq via INTRAVENOUS
  Filled 2012-04-07: qty 400

## 2012-04-07 MED ORDER — POTASSIUM CHLORIDE 10 MEQ/50ML IV SOLN
10.0000 meq | INTRAVENOUS | Status: AC
  Administered 2012-04-07 (×3): 10 meq via INTRAVENOUS

## 2012-04-07 MED ORDER — INSULIN REGULAR HUMAN 100 UNIT/ML IJ SOLN
INTRAVENOUS | Status: AC
  Administered 2012-04-07: 18:00:00 via INTRAVENOUS
  Filled 2012-04-07: qty 2000

## 2012-04-07 MED ORDER — POTASSIUM PHOSPHATE DIBASIC 3 MMOLE/ML IV SOLN
20.0000 mmol | Freq: Once | INTRAVENOUS | Status: AC
  Administered 2012-04-07: 20 mmol via INTRAVENOUS
  Filled 2012-04-07: qty 6.67

## 2012-04-07 NOTE — Progress Notes (Signed)
[facility] Progress Note [place] TEAM 1 - Stepdown/ICU TEAM   ANNAIS CRAFTS WUJ:[id number] DOB: 20-Aug-1914 DOA: 03/23/2012 PCP: Enrique Sack, MD  Brief narrative: 78 year old female patient admitted 03/23/2012 with partial small bowel obstruction. This required NG tube decompression and patient apparently did well for 2 days after the NG tube was discontinued but unfortunately redeveloped bowel obstruction. Surgery was consulted. Patient subsequently underwent exploratory laparotomy with lysis of adhesions on 04/04/2012 and was sent to the step down unit for further monitoring and treatment post operatively.   Assessment/Plan: Principal Problem:  *Partial small bowel obstruction s/p surgery *Appreciate surgical team assistance * Patient with NG to intermittent suction, she is pleasantly tolerant of the NG, but c/o sore throat and becoming more anxious about "getting it out". Still probably needs more time for bowel to rebound post operatively, hypoactive BS, output decreasing is overall trend. Clamping trials per surgery.  *Continue mobilization (refused OOB yesterday)- this is key to her overall recovery given her advanced age and very frail physical state, fortunately she is mentally/cognitively very sharp and no signs of delirium.  *Blood per NG Tube Likely NG induced trauma. Hb down apx 1 gram but still in normal low range. Will repeat in AM. No known history of PUD. Bleeding has stopped. PPI. If she has recurrence of bleeding, she will need GI evaluation.   Active Problems:  Dehydration/Hypokalemia *Now on TPN, per pharmacy  *Multifactorial secondary to dehydration prior to admission as well as continued GI losses from NG tube * Repeat IV potassium boluses today. Hopefully parenteral nutrition will help minimize hypokalemia *Follow electrolyte panel daily-has R PICC   Tachycardia *110's this AM, BP lower today will monitor closely. NSR. *Likely related to dehydration but  could be related to pain/stress as well as withdrawal from calcium channel blocker *On scheduled low-dose Lopressor every 8 hours IV *Discontinued Cardizem CD per NG tube since not effective in this setting  Diabetes mellitus type 2, controlled *On metformin prior to admission, discontinued indefinitely *CBGs in normal range *N.p.o. so we'll check CBGs - since they have been mostly normal changed to q8 checks with coverage instead of q4-no reason to stick her any more than necessary.   Hypertension *Blood pressure lower today, likely from IV lopressor for tachycardia *Holding oral Cardizem as well as Hydro Diuril   CAD (coronary artery disease) on Pradaxa pre-op *Currently no clinical signs of ischemia and patient denies chest pain *Unable to administer Pradaxa because of bowel rest postoperatively for ileus *All anticoagulation on Hold, especially in setting of Blood in NG   Protein calorie malnutrition *Appreciate nutrition team evaluation *Pharmacy to manage parenteral nutrition *PICC line placed to facilitate nutrition as well as administration of IV fluids and to obtain laboratory data   Chronic diastolic congestive heart failure, NYHA class 1 (ECHO 2008) *Currently compensated, euvolemic-resolving dehydration   Chronic Leukocytosis/Chronic Thrombocytosis *Hb trending down Has been evaluated by hematology in the past - abnormalities felt to be reactive in nature *Certainly has an acute component to the leukocytosis so we'll continue to monitor. Suspect has acute reactive component do to acute surgical procedure   COPD (chronic obstructive pulmonary disease)/Pulmonary HTN - 41 mmHg *Compensated *Was on Spiriva the and Mucinex prior to admission *Continue nebulizers  DVT prophylaxis: Lovenox subcutaneous daily Code Status: LIMITED CODE (Changed on 10/6-per Dr. Phillips Odor) Patient is AOX3- we discussed advanced planning and code status in detail, "I do not want a prolonged or  painful death" She has a living  will. I asked permission to talk to her sons but she said that she "knows what she wants". This status of course does not preclude her from aggressive treatment of reversible conditions or full scope medical care. This has been designated LIMITED as an appropriate status since she made need medications for BP and dysrythmias. Given her advanced age and frailty, her chances for surviving ACLS/CPR are less than 1%.  Family Communication: Spoke with patient Disposition Plan: Remain in stepdown  Consultants: General surgery  Procedures: Exploratory laparotomy with lysis of adhesions and release of small bowel traction in setting of recurrent small bowel obstruction on 04/04/2012  Antibiotics: None  HPI/Subjective: Denies abdominal pain, sore throat from NG, didn't feel like getting OOB yesterday.   Objective: Blood pressure 94/58, pulse 110, temperature 98.6 F (37 C), temperature source Oral, resp. rate 22, height 5' 3.5" (1.613 m), weight 33.7 kg (74 lb 4.7 oz), SpO2 94.00%.  Intake/Output Summary (Last 24 hours) at 04/07/12 0757 Last data filed at 04/07/12 0700  Gross per 24 hour  Intake    655 ml  Output   1325 ml  Net   -670 ml     Exam: General: AOX3, Frail, No acute respiratory distress Lungs: Clear to auscultation bilaterally without wheezes or crackles Cardiovascular: Regular rate and rhythm without murmur gallop or rub  Abdomen: Nontender, nondistended, soft, bowel sounds hypoactive, no rebound, no ascites, no appreciable mass- NGT to IS- melenotic appearing returns Extremities: No significant cyanosis, clubbing, or edema bilateral lower extremities  Data Reviewed: Basic Metabolic Panel:  Lab 04/07/12 1610 04/06/12 0355 04/05/12 0500 04/04/12 1707 04/04/12 0650 04/03/12 0630  NA 138 139 140 -- 139 138  K 3.1* 3.2* 3.0* -- 3.4* 4.0  CL 104 105 102 -- 104 98  CO2 27 25 21  -- 25 22  GLUCOSE 131* 232* 83 -- 81 120*  BUN 10 11 11  -- 11 8   CREATININE 0.49* 0.55 0.57 0.58 0.64 --  CALCIUM 7.0* 7.1* 7.4* -- 7.8* 9.1  MG 1.8 1.1* -- -- -- --  PHOS 2.2* 1.6* -- -- -- --   Liver Function Tests:  Lab 04/06/12 0355  AST 14  ALT 7  ALKPHOS 68  BILITOT 0.4  PROT 4.7*  ALBUMIN 2.2*   No results found for this basename: LIPASE:5,AMYLASE:5 in the last 168 hours No results found for this basename: AMMONIA:5 in the last 168 hours CBC:  Lab 04/07/12 0501 04/06/12 0355 04/05/12 1342 04/05/12 0500 04/04/12 1525  WBC 16.3* 20.9* 29.2* 23.2* 26.2*  NEUTROABS -- -- -- -- --  HGB 11.3* 12.1 13.6 13.0 12.3  HCT 33.2* 35.7* 40.7 38.8 36.3  MCV 89.7 90.2 91.5 90.2 90.3  PLT 430* 477* 452* 507* 501*   CBG:  Lab 04/07/12 0418 04/07/12 0040 04/06/12 2039 04/06/12 1606 04/06/12 1152  GLUCAP 125* 144* 137* 193* 199*    Recent Results (from the past 240 hour(s))  SURGICAL PCR SCREEN     Status: Normal   Collection Time   04/03/12  6:55 PM      Component Value Range Status Comment   MRSA, PCR NEGATIVE  NEGATIVE Final    Staphylococcus aureus NEGATIVE  NEGATIVE Final      Jacobi Ryant 04/07/2012 [phone](Pager)  On-Call/Text Page:      [email or web address]      password [email or web address]  If 7PM-7AM, please contact night-coverage [email or web address] Password [email or web address] 04/07/2012, 7:57 AM   LOS: 15 days

## 2012-04-07 NOTE — Progress Notes (Signed)
PARENTERAL NUTRITION CONSULT NOTE - F/U  Pharmacy Consult for TPN Indication: PCM  No Known Allergies  Patient Measurements: Height: 5' 3.5" (161.3 cm) Weight: 74 lb 4.7 oz (33.7 kg) IBW/kg (Calculated) : 53.55  Adjusted Body Weight: 33 kg Usual Weight: 33 kg  Vital Signs: Temp: 98.6 F (37 C) (10/06 0700) Temp src: Oral (10/06 0700) BP: 132/65 mmHg (10/06 0737) Pulse Rate: 85  (10/06 0737) Intake/Output from previous day: 10/05 0701 - 10/06 0700 In: 745 [I.V.:705; TPN:40] Out: 1450 [Urine:1350; Emesis/NG output:100] Intake/Output from this shift: Total I/O In: 280 [I.V.:100; IV Piggyback:100; TPN:80] Out: 175 [Urine:175]  Labs:  [facility] 04/07/12 0501 04/06/12 0355 04/05/12 1342  WBC 16.3* 20.9* 29.2*  HGB 11.3* 12.1 13.6  HCT 33.2* 35.7* 40.7  PLT 430* 477* 452*  APTT -- -- --  INR -- -- --     Basename 04/07/12 0501 04/06/12 0355 04/05/12 0500  NA 138 139 140  K 3.1* 3.2* 3.0*  CL 104 105 102  CO2 27 25 21   GLUCOSE 131* 232* 83  BUN 10 11 11   CREATININE 0.49* 0.55 0.57  LABCREA -- -- --  CREAT24HRUR -- -- --  CALCIUM 7.0* 7.1* 7.4*  MG 1.8 1.1* --  PHOS 2.2* 1.6* --  PROT -- 4.7* --  ALBUMIN -- 2.2* --  AST -- 14 --  ALT -- 7 --  ALKPHOS -- 68 --  BILITOT -- 0.4 --  BILIDIR -- -- --  IBILI -- -- --  PREALBUMIN -- 5.1* --  TRIG -- 99 --  CHOLHDL -- -- --  CHOL -- -- --   Estimated Creatinine Clearance: 22.4 ml/min (by C-G formula based on Cr of 0.49).    Basename 04/07/12 0418 04/07/12 0040 04/06/12 2039  GLUCAP 125* 144* 137*    Medical History: Past Medical History  Diagnosis Date  . COPD (chronic obstructive pulmonary disease)   . CHF (congestive heart failure)   . Edema   . Myocardial infarction   . Anxiety   . Hypertension   . Anemia   . Leukocytosis   . Acid reflux     Insulin Requirements in the past 24 hours:  0 units SSI  CBGs 195 down to 125 this am after adding insulin to TPN 10 units/bag in TPN  Current  Nutrition:  Low Na, hearth healthy diet. No po intake noted.  Nutritional needs: [phone] kcal, 30-40g protein, ~1.2 L/day according to RD note 10/4 Clinimix 4.25/25 at 40 ml/hr with lipids MWF averages 1081 kcal and 40.8 g protein/day  Assessment: 78 y/o (only 33 kg) assisted living resident with recurrent small bowel obstruction secondary to adhesions and internal hernia. Ex.lap with LOA 10/3. High risk of refeeding syndrome.  GI: Occasional bowel sounds. NG bilious. H/o GERD on IV PPI. Emesis/NG out 100/24h. NP note indicates the drainage was bloody due to suction trauma. Still with evidence of post-op ileus per MD note with hypoactive BS but NG output is decreasing.  Anticoag: Lovenox d/c 10/4. Note current bloody NG output has stopped per MD note. Hgb 13.6 to 12.1 to 11.3 today.  Lytes: K=3.1 ( 4 K runs today).   Phos low at 1.6 up to 2.2 after (Kphos 10/5)  Mg 1.1 up to 1.8 (Mag 4g 10/5) demonstrates refeeding syndrome.  Hepatobiliary: Check LFTs in am  ID: Afebrile. WBC down to 16.3. No abx.  Endo: CBGs have steadily declined from 195 down to 125 this am after adding insulin to TPN  Renal: Scr  0.49. I/O -710. RN noted decreased UOP 10/4 pm but UOP 1.7 ml/kg/hr last 24h.  Pulm: COPD on Spiriva  Cards: HTN, Afib (on Pradaxa PTA), 132/65, HR 85 improved on IV metoprolol  Neuro: Normal mental status.  Plan:  Clinimix 4.25/25 at 53ml/hr. This is her goal rate. Noted refeeding. Insulin to TPN MV/Trace elements MWF due to national backorder. Lipids MWF due to national backorder. Will give additional supplementation of K-phos today.   Pranavi Aure S. Merilynn Finland, PharmD, BCPS Clinical Staff Pharmacist Pager [phone]  Misty Stanley Stillinger 04/07/2012,10:11 AM

## 2012-04-07 NOTE — Progress Notes (Signed)
[facility] RN d/c NGT per MD order aprx. 11am, pt currently with no complaints, ABD soft, pt tol well

## 2012-04-07 NOTE — Progress Notes (Signed)
3 Days Post-Op  Subjective: No complaints No flatus  Objective: Vital signs in last 24 hours: Temp:  [97.7 F (36.5 C)-98.6 F (37 C)] 98.6 F (37 C) (10/06 0700) Pulse Rate:  [85-110] 85  (10/06 0737) Resp:  [21-23] 22  (10/06 0737) BP: (94-134)/(54-90) 132/65 mmHg (10/06 0737) SpO2:  [93 %-97 %] 97 % (10/06 0909) Last BM Date: 04/03/12  Intake/Output from previous day: 10/05 0701 - 10/06 0700 In: 745 [I.V.:705; TPN:40] Out: 1450 [Urine:1350; Emesis/NG output:100] Intake/Output this shift: Total I/O In: 280 [I.V.:100; IV Piggyback:100; TPN:80] Out: 175 [Urine:175]  Lungs clear Abdomen soft, Non-distended, +BS Incision clean  Lab Results:   [facility] 04/07/12 0501 04/06/12 0355  WBC 16.3* 20.9*  HGB 11.3* 12.1  HCT 33.2* 35.7*  PLT 430* 477*   BMET  Basename 04/07/12 0501 04/06/12 0355  NA 138 139  K 3.1* 3.2*  CL 104 105  CO2 27 25  GLUCOSE 131* 232*  BUN 10 11  CREATININE 0.49* 0.55  CALCIUM 7.0* 7.1*   PT/INR No results found for this basename: LABPROT:2,INR:2 in the last 72 hours ABG No results found for this basename: PHART:2,PCO2:2,PO2:2,HCO3:2 in the last 72 hours  Studies/Results: Dg Abd Portable 1v  04/05/2012  *RADIOLOGY REPORT*  Clinical Data: Evaluate nasogastric tube placement.  PORTABLE ABDOMEN - 1 VIEW  Comparison: 04/02/2012  Findings: The nasogastric tube is in the stomach body region.  The side hole of the nasogastric tube is near the GE junction.  The degree of small bowel distention has markedly decreased.  There continues to be some dilated loops of bowel in the left upper quadrant.   Patient has undergone abdominal surgery and skin staples are present.  Postoperative changes in both hips. Difficult to evaluate for free air on this examination.  Lucency in the right upper abdomen could be related to recent surgery.  IMPRESSION: Nasogastric tube in the stomach body region.  Nonspecific bowel gas pattern.  Few dilated loops of bowel in the  left upper quadrant.   Original Report Authenticated By: Richarda Overlie, M.D.     Anti-infectives: Anti-infectives     Start     Dose/Rate Route Frequency Ordered Stop   04/04/12 0600   cefOXitin (MEFOXIN) 2 g in dextrose 5 % 50 mL IVPB        2 g 100 mL/hr over 30 Minutes Intravenous 60 min pre-op 04/03/12 1419 04/04/12 1204          Assessment/Plan: s/p Procedure(s) (LRB) with comments: EXPLORATORY LAPAROTOMY (N/A) LYSIS OF ADHESION () - for SBO  Will d/c NG tube ambulate  LOS: 15 days    Joplin Canty A 04/07/2012

## 2012-04-08 ENCOUNTER — Inpatient Hospital Stay (HOSPITAL_COMMUNITY): Payer: Medicare Other

## 2012-04-08 LAB — GLUCOSE, CAPILLARY
Glucose-Capillary: 121 mg/dL — ABNORMAL HIGH (ref 70–99)
Glucose-Capillary: 163 mg/dL — ABNORMAL HIGH (ref 70–99)

## 2012-04-08 LAB — COMPREHENSIVE METABOLIC PANEL
ALT: 7 U/L (ref 0–35)
Albumin: 1.9 g/dL — ABNORMAL LOW (ref 3.5–5.2)
Alkaline Phosphatase: 53 U/L (ref 39–117)
Calcium: 7.4 mg/dL — ABNORMAL LOW (ref 8.4–10.5)
GFR calc Af Amer: >90 mL/min (ref >90)
Potassium: 3.5 mEq/L (ref 3.5–5.1)
Sodium: 138 mEq/L (ref 135–145)
Total Protein: 4.4 g/dL — ABNORMAL LOW (ref 6.0–8.3)

## 2012-04-08 LAB — TRIGLYCERIDES: Triglycerides: 93 mg/dL (ref <150)

## 2012-04-08 LAB — PREALBUMIN: Prealbumin: 5 mg/dL — ABNORMAL LOW (ref 17.0–34.0)

## 2012-04-08 MED ORDER — POTASSIUM CHLORIDE 10 MEQ/50ML IV SOLN: 10.0000 meq | INTRAVENOUS | Status: DC

## 2012-04-08 MED ORDER — FAT EMULSION 20 % IV EMUL
250.0000 mL | INTRAVENOUS | Status: AC
  Administered 2012-04-08: 250 mL via INTRAVENOUS
  Filled 2012-04-08: qty 250

## 2012-04-08 MED ORDER — POTASSIUM CHLORIDE 10 MEQ/50ML IV SOLN
10.0000 meq | INTRAVENOUS | Status: AC
  Administered 2012-04-08 (×2): 10 meq via INTRAVENOUS
  Filled 2012-04-08 (×2): qty 50

## 2012-04-08 MED ORDER — SODIUM CHLORIDE 0.9 % IJ SOLN
INTRAMUSCULAR | Status: AC
  Filled 2012-04-08: qty 20

## 2012-04-08 MED ORDER — MORPHINE SULFATE 2 MG/ML IJ SOLN
1.0000 mg | INTRAMUSCULAR | Status: DC | PRN
  Filled 2012-04-08: qty 1

## 2012-04-08 MED ORDER — INSULIN ASPART 100 UNIT/ML ~~LOC~~ SOLN
0.0000 [IU] | Freq: Four times a day (QID) | SUBCUTANEOUS | Status: DC
  Administered 2012-04-08 (×2): 2 [IU] via SUBCUTANEOUS
  Administered 2012-04-09 – 2012-04-11 (×7): 1 [IU] via SUBCUTANEOUS
  Administered 2012-04-11: 3 [IU] via SUBCUTANEOUS

## 2012-04-08 MED ORDER — MAGNESIUM SULFATE 40 MG/ML IJ SOLN
2.0000 g | Freq: Once | INTRAMUSCULAR | Status: AC
  Administered 2012-04-08: 2 g via INTRAVENOUS
  Filled 2012-04-08: qty 50

## 2012-04-08 MED ORDER — ZINC TRACE METAL 1 MG/ML IV SOLN
INTRAVENOUS | Status: AC
  Administered 2012-04-08: 17:00:00 via INTRAVENOUS
  Filled 2012-04-08: qty 2000

## 2012-04-08 MED ORDER — ACETAMINOPHEN 325 MG PO TABS: 650.0000 mg | ORAL_TABLET | ORAL | Status: DC | PRN

## 2012-04-08 MED ORDER — POTASSIUM PHOSPHATE DIBASIC 3 MMOLE/ML IV SOLN
10.0000 mmol | Freq: Once | INTRAVENOUS | Status: AC
  Administered 2012-04-08: 10 mmol via INTRAVENOUS
  Filled 2012-04-08 (×2): qty 3.33

## 2012-04-08 NOTE — Progress Notes (Addendum)
Patient received to unit from 3300. Patient A&Ox4, transferred to bed from wheelchair with minimal assistance. Patient denies pain. Vital signs are stable.  Patient stable; no SOB, wheezing, chest pain, dizziness.  Bowel sounds normoactive in all four quadrants. No abdominal distension noted. Abdominal dressing clean, dry, and intact. Yankauer suction on continuous and placed within reach, per patient's request. Patient oriented to unit.  Call bell within reach.  Will continue to monitor.

## 2012-04-08 NOTE — Progress Notes (Signed)
PT Cancellation Note  Patient Details Name: Bianca Khan MRN: [id number] DOB: 1914-12-10   Cancelled Treatment:    Reason Eval/Treat Not Completed: Fatigue/lethargy limiting ability to participate. Pt back from xray in recliner and denies any mobility at this time due to being settled in her chair. Despite education for need to mobilize to return to ALF pt refused mobility currently. RN aware and states she will attempt to have pt ambulate prior to room transfer. Will attempt at later date. Delaney Meigs, PT [phone]

## 2012-04-08 NOTE — Progress Notes (Signed)
I have reviewed and discussed the care of this patient in detail with the nurse practitioner including pertinent patient records, physical exam findings and data. I agree with details of this encounter.  

## 2012-04-08 NOTE — Progress Notes (Signed)
PT Cancellation Note  Patient Details Name: Bianca Khan MRN: [id number] DOB: March 01, 1915   Cancelled Treatment:     Pt not in room currently for participation, OOR for test. Will attempt in later today as time allows.    Toney Sang Beth 04/08/2012, 10:17 AM Delaney Meigs, PT [phone]

## 2012-04-08 NOTE — Progress Notes (Signed)
Patient's foley d/c'd per MD order, per policy. Patient tolerated well, will contiune to monitor.

## 2012-04-08 NOTE — Progress Notes (Signed)
4 Days Post-Op  Subjective: No complaints.  No pain right now, no nausea + flatus this am.  Objective: Vital signs in last 24 hours: Temp:  [97.4 F (36.3 C)-99 F (37.2 C)] 98.1 F (36.7 C) (10/07 0715) Pulse Rate:  [81-115] 81  (10/07 0444) Resp:  [26-32] 28  (10/07 0444) BP: (110-125)/(59-74) 114/74 mmHg (10/07 0444) SpO2:  [96 %-97 %] 96 % (10/07 0444) Weight:  [74 lb 4.7 oz (33.7 kg)] 74 lb 4.7 oz (33.7 kg) (10/07 0444) Last BM Date: 04/03/12  Intake/Output from previous day: 10/06 0701 - 10/07 0700 In: 1836.7 [I.V.:600; IV Piggyback:756.7; TPN:480] Out: 2500 [Urine:2500] Intake/Output this shift: Total I/O In: 90 [I.V.:50; TPN:40] Out: 225 [Urine:225]  Lungs clear Abdomen soft, Non-distended, +BS Incision clean  Lab Results:   [facility] 04/07/12 0501 04/06/12 0355  WBC 16.3* 20.9*  HGB 11.3* 12.1  HCT 33.2* 35.7*  PLT 430* 477*   BMET  Basename 04/08/12 0538 04/07/12 0501  NA 138 138  K 3.5 3.1*  CL 105 104  CO2 28 27  GLUCOSE 92 131*  BUN 10 10  CREATININE 0.46* 0.49*  CALCIUM 7.4* 7.0*   PT/INR No results found for this basename: LABPROT:2,INR:2 in the last 72 hours ABG No results found for this basename: PHART:2,PCO2:2,PO2:2,HCO3:2 in the last 72 hours  Studies/Results: No results found.  Anti-infectives: Anti-infectives     Start     Dose/Rate Route Frequency Ordered Stop   04/04/12 0600   cefOXitin (MEFOXIN) 2 g in dextrose 5 % 50 mL IVPB        2 g 100 mL/hr over 30 Minutes Intravenous 60 min pre-op 04/03/12 1419 04/04/12 1204          Assessment/Plan: s/p Procedure(s) (LRB) with comments: EXPLORATORY LAPAROTOMY (N/A) LYSIS OF ADHESION () - for SBO  Will start clears ambulate  LOS: 16 days    Marius Betts C. 04/08/2012

## 2012-04-08 NOTE — Progress Notes (Signed)
Patient transferred to 6700.  Report called to RN on 6700 and all questions answered. Patient transferred via wheelchair with NT and family.

## 2012-04-08 NOTE — Progress Notes (Signed)
[facility] Progress Note [place] TEAM 1 - Stepdown/ICU TEAM   ADIE HOECKER ZOX:[id number] DOB: [birth year] DOA: 03/23/2012 PCP: Enrique Sack, MD  Brief narrative: 78 year old female patient admitted 03/23/2012 with partial small bowel obstruction. This required NG tube decompression and patient apparently did well for 2 days after the NG tube was discontinued but unfortunately redeveloped bowel obstruction. Surgery was consulted. Patient subsequently underwent exploratory laparotomy with lysis of adhesions on 04/04/2012 and was sent to the step down unit for further monitoring and treatment post operatively.   Assessment/Plan: Principal Problem:  *Partial small bowel obstruction s/p surgery *Appreciate surgical team assistance * NG tube discontinued 04/07/2012. Patient endorses flatus. Tolerating ice chips. Awaiting surgical team approval to attempt clear liquids *Continue mobilization (refused OOB yesterday)- this is key to her overall recovery given her advanced age and very frail physical state, fortunately she is mentally/cognitively very sharp and no signs of delirium. *Check abdominal films to clarify if ileus still persistent  *Blood per NG Tube *Likely NG induced trauma. Bleeding has stopped and NG tube has been removed *Hb down apx 1 gram but still in normal low range.  *Will repeat in AM. No known history of PUD.   *Continue PPI. If she has recurrence of bleeding, she will need GI evaluation.   Active Problems:  Dehydration/Hypokalemia *Now on TPN, per pharmacy  *Multifactorial secondary to dehydration prior to admission as well as continued GI losses from NG tube-should improve now that NG tube is out *Follow electrolyte panel daily-has R PICC   Tachycardia *Heart rate has been less than 100 since midnight 04/08/2012 *Likely related to dehydration but could be related to pain/stress as well as withdrawal from calcium channel blocker *On scheduled low-dose  Lopressor every 8 hours IV *Discontinued Cardizem CD per NG tube since not effective in this setting  Diabetes mellitus type 2, controlled *On metformin prior to admission, discontinued indefinitely *CBGs in normal range *N.p.o. so we'll check CBGs - since they have been mostly normal changed to q8 checks with coverage instead of q4-no reason to stick her any more than necessary.   Hypertension *Blood pressure lower today, likely from IV lopressor for tachycardia *Holding oral Cardizem as well as Hydro Diuril   CAD (coronary artery disease) on Pradaxa pre-op *Currently no clinical signs of ischemia and patient denies chest pain *Unable to administer Pradaxa because of bowel rest postoperatively for ileus *All anticoagulation on Hold, especially in setting of Blood in NG   Protein calorie malnutrition *Appreciate nutrition team evaluation *Pharmacy to manage parenteral nutrition *PICC line placed to facilitate nutrition as well as administration of IV fluids and to obtain laboratory data   Chronic diastolic congestive heart failure, NYHA class 1 (ECHO 2008) *Currently compensated, euvolemic-resolving dehydration   Chronic Leukocytosis/Chronic Thrombocytosis *Has been evaluated by hematology in the past - abnormalities felt to be reactive in nature *Certainly has an acute component to the leukocytosis so we'll continue to monitor. Suspect has acute reactive component do to acute surgical procedure *Repeat CBC in a.m.   COPD (chronic obstructive pulmonary disease)/Pulmonary HTN - 41 mmHg *Compensated *Was on Spiriva the and Mucinex prior to admission *Continue nebulizers *Pulmonary exam today reveals some expiratory wheezing and more coarse sounds especially on the right therefore we'll check a chest x-ray today *Add flutter valve  DVT prophylaxis: Lovenox subcutaneous daily Code Status: LIMITED CODE (Changed on 10/6-per Dr. Phillips Odor) Patient is AOX3- we discussed advanced planning  and code status in detail, "I do not want  a prolonged or painful death" She has a living will. I asked permission to talk to her sons but she said that she "knows what she wants". This status of course does not preclude her from aggressive treatment of reversible conditions or full scope medical care. This has been designated LIMITED as an appropriate status since she made need medications for BP and dysrythmias. Given her advanced age and frailty, her chances for surviving ACLS/CPR are less than 1%. Family Communication: Spoke with patient Disposition Plan: Transfer to surgical floor  Consultants: General surgery  Procedures: Exploratory laparotomy with lysis of adhesions and release of small bowel traction in setting of recurrent small bowel obstruction on 04/04/2012  Antibiotics: None  HPI/Subjective: Denies any abdominal pain, nausea or vomiting. No problems with ice chips. Reports flatus. Denies BM.   Objective: Blood pressure 129/71, pulse 82, temperature 98 F (36.7 C), temperature source Oral, resp. rate 18, height 5' 3.5" (1.613 m), weight 33.7 kg (74 lb 4.7 oz), SpO2 95.00%.  Intake/Output Summary (Last 24 hours) at 04/08/12 1302 Last data filed at 04/08/12 1208  Gross per 24 hour  Intake   1100 ml  Output   2550 ml  Net  -1450 ml     Exam: General: AOX3, Frail, No acute respiratory distress Lungs: Coarse lung sounds with some expiratory wheezing greater on the right but primarily rub like crackle Cardiovascular: Regular rate and rhythm without murmur gallop or rub, no tachycardia, IV fluid at 50 cc per Abdomen: Nontender, nondistended, soft, bowel sounds hypoactive, no rebound, no ascites, no appreciable mass, parenteral nutrition Musculoskeletal: No significant cyanosis, clubbing of bilateral lower extremities Neurological: Alert and oriented x3 moves all extremities x4, exam non focal  Data Reviewed: Basic Metabolic Panel:  Lab 04/08/12 1610 04/07/12 0501  04/06/12 0355 04/05/12 0500 04/04/12 1707 04/04/12 0650  NA 138 138 139 140 -- 139  K 3.5 3.1* 3.2* 3.0* -- 3.4*  CL 105 104 105 102 -- 104  CO2 28 27 25 21  -- 25  GLUCOSE 92 131* 232* 83 -- 81  BUN 10 10 11 11  -- 11  CREATININE 0.46* 0.49* 0.55 0.57 0.58 --  CALCIUM 7.4* 7.0* 7.1* 7.4* -- 7.8*  MG 1.5 1.8 1.1* -- -- --  PHOS 2.4 2.2* 1.6* -- -- --   Liver Function Tests:  Lab 04/08/12 0538 04/06/12 0355  AST 12 14  ALT 7 7  ALKPHOS 53 68  BILITOT 0.4 0.4  PROT 4.4* 4.7*  ALBUMIN 1.9* 2.2*   No results found for this basename: LIPASE:5,AMYLASE:5 in the last 168 hours No results found for this basename: AMMONIA:5 in the last 168 hours CBC:  Lab 04/07/12 0501 04/06/12 0355 04/05/12 1342 04/05/12 0500 04/04/12 1525  WBC 16.3* 20.9* 29.2* 23.2* 26.2*  NEUTROABS -- -- -- -- --  HGB 11.3* 12.1 13.6 13.0 12.3  HCT 33.2* 35.7* 40.7 38.8 36.3  MCV 89.7 90.2 91.5 90.2 90.3  PLT 430* 477* 452* 507* 501*   CBG:  Lab 04/08/12 1212 04/08/12 0739 04/08/12 0025 04/07/12 1500 04/07/12 0739  GLUCAP 163* 121* 181* 202* 154*    Recent Results (from the past 240 hour(s))  SURGICAL PCR SCREEN     Status: Normal   Collection Time   04/03/12  6:55 PM      Component Value Range Status Comment   MRSA, PCR NEGATIVE  NEGATIVE Final    Staphylococcus aureus NEGATIVE  NEGATIVE Final      Camilia Caywood L.,ANP 04/08/[phone] (Pager)  On-Call/Text  Page:      [email or web address]      password [email or web address]  If 7PM-7AM, please contact night-coverage [email or web address] Password [email or web address] 04/08/2012, 1:02 PM   LOS: 16 days

## 2012-04-08 NOTE — Progress Notes (Signed)
PARENTERAL NUTRITION CONSULT NOTE - Follow Up  Pharmacy Consult: TPN Indication: PCM  No Known Allergies  Patient Measurements: Height: 5' 3.5" (161.3 cm) Weight: 74 lb 4.7 oz (33.7 kg) IBW/kg (Calculated) : 53.55  Adjusted Body Weight: 33 kg Usual Weight: 33 kg  Vital Signs: Temp: 98.1 F (36.7 C) (10/07 0715) Temp src: Oral (10/07 0715) BP: 114/74 mmHg (10/07 0444) Pulse Rate: 81  (10/07 0444) Intake/Output from previous day: 10/06 0701 - 10/07 0700 In: 1836.7 [I.V.:600; IV Piggyback:756.7; TPN:480] Out: 2500 [Urine:2500] Intake/Output from this shift: Total I/O In: 90 [I.V.:50; TPN:40] Out: 225 [Urine:225]  Labs:  [facility] 04/07/12 0501 04/06/12 0355 04/05/12 1342  WBC 16.3* 20.9* 29.2*  HGB 11.3* 12.1 13.6  HCT 33.2* 35.7* 40.7  PLT 430* 477* 452*  APTT -- -- --  INR -- -- --     Basename 04/08/12 0538 04/07/12 0501 04/06/12 0355  NA 138 138 139  K 3.5 3.1* 3.2*  CL 105 104 105  CO2 28 27 25   GLUCOSE 92 131* 232*  BUN 10 10 11   CREATININE 0.46* 0.49* 0.55  LABCREA -- -- --  CREAT24HRUR -- -- --  CALCIUM 7.4* 7.0* 7.1*  MG 1.5 1.8 1.1*  PHOS 2.4 2.2* 1.6*  PROT 4.4* -- 4.7*  ALBUMIN 1.9* -- 2.2*  AST 12 -- 14  ALT 7 -- 7  ALKPHOS 53 -- 68  BILITOT 0.4 -- 0.4  BILIDIR -- -- --  IBILI -- -- --  PREALBUMIN -- -- 5.1*  TRIG 93 -- 99  CHOLHDL -- -- --  CHOL -- -- --   Estimated Creatinine Clearance: 22.4 ml/min (by C-G formula based on Cr of 0.46).    Basename 04/08/12 0739 04/08/12 0025 04/07/12 1500  GLUCAP 121* 181* 202*    Medical History: Past Medical History  Diagnosis Date  . COPD (chronic obstructive pulmonary disease)   . CHF (congestive heart failure)   . Edema   . Myocardial infarction   . Anxiety   . Hypertension   . Anemia   . Leukocytosis   . Acid reflux     Insulin Requirements in the past 24 hours:  5 units SSI + 10 units in TPN  Current Nutrition:  Clear liquid diet + TPN   Assessment:  78 y/o (only 33 kg)  assisted living resident with recurrent small bowel obstruction secondary to adhesions and internal hernia. S/p ex-lap with LOA 04/04/12 and patient continues on TPN for nutritional support.  Noted diet is changed to clear liquid.  GI: s/p LOA for SBO, post-op ileus, hx GERD on IV PPI.  NGT d/c'ed 10/6, +flatus Anticoag: has bloody NG O/P and Lovenox d/c'ed.  H/H/plts ok Lytes: K+/Phos low end of normal post supplementation (goal K+ ~4 for ileus), magnesium decreasing, others WNL Hepatobiliary: LFTs / tbili / TG WNL ID: Afebrile. WBC trending down - not on abx Endo: CBGs mostly controlled with some highs Renal: SCr stable, CrCL 22 ml/min,  NS at 50 ml/hr, excellent UOP Pulm: hx COPD on Spiriva, stable on RA Cards: hx HTN / Afib (on Pradaxa PTA) - BP normal, HR variable 75-115, on IV metoprolol Neuro: A&O Best Practices: SCDs, MC  Nutritional needs:  [phone] kcal, 30-40g protein, ~1.2 L/day    Plan:  - Continue Clinimix 4.25/25 at goal rate of 61ml/hr, providing 100% of needs - Increase insulin to 13 units in TPN - Lipids, multivitamins, and available trace elements on MWF due to national backorder. - KCL x 2 runs -  KPhos 10 mmol IV x 1 - Magnesium sulfate 2gm IV x 1 - F/U PO intake to start weaning TPN and start PO multivitamin - Labs in AM    Jimie Kuwahara D. Laney Potash, PharmD, BCPS Pager:  [phone] 04/08/2012, 9:30 AM

## 2012-04-09 LAB — PHOSPHORUS: Phosphorus: 2.5 mg/dL (ref 2.3–4.6)

## 2012-04-09 LAB — BASIC METABOLIC PANEL
CO2: 27 mEq/L (ref 19–32)
Calcium: 7.8 mg/dL — ABNORMAL LOW (ref 8.4–10.5)
GFR calc non Af Amer: 80 mL/min — ABNORMAL LOW (ref >90)
Potassium: 3.7 mEq/L (ref 3.5–5.1)
Sodium: 141 mEq/L (ref 135–145)

## 2012-04-09 LAB — GLUCOSE, CAPILLARY
Glucose-Capillary: 113 mg/dL — ABNORMAL HIGH (ref 70–99)
Glucose-Capillary: 125 mg/dL — ABNORMAL HIGH (ref 70–99)
Glucose-Capillary: 127 mg/dL — ABNORMAL HIGH (ref 70–99)

## 2012-04-09 LAB — CBC
MCH: 29.8 pg (ref 26.0–34.0)
Platelets: 395 10*3/uL (ref 150–400)
RBC: 3.56 MIL/uL — ABNORMAL LOW (ref 3.87–5.11)

## 2012-04-09 LAB — MAGNESIUM: Magnesium: 1.8 mg/dL (ref 1.5–2.5)

## 2012-04-09 MED ORDER — INSULIN REGULAR HUMAN 100 UNIT/ML IJ SOLN
INTRAVENOUS | Status: AC
  Administered 2012-04-09: 18:00:00 via INTRAVENOUS
  Filled 2012-04-09: qty 2000

## 2012-04-09 MED ORDER — ADULT MULTIVITAMIN W/MINERALS CH
1.0000 | ORAL_TABLET | Freq: Every day | ORAL | Status: DC
  Administered 2012-04-09 – 2012-04-11 (×3): 1 via ORAL
  Filled 2012-04-09 (×3): qty 1

## 2012-04-09 NOTE — Progress Notes (Addendum)
Occupational Therapy Treatment Patient Details Name: Bianca Khan MRN: [id number] DOB: Feb 10, 1915 Today's Date: 04/09/2012 Time: 6578-4696 OT Time Calculation (min): 10 min  OT Assessment / Plan / Recommendation Comments on Treatment Session  Limited session due to fatigue. Discussed need for pt to become more independent if her goal is to reurn to ALF. Plan to participate in ADL session in am. Discussed need for pt to use BSC instead of bed pan with pt and nursing staff.    Follow Up Recommendations  Skilled nursing facility;Other (comment) (vs ALF)    Barriers to Discharge       Equipment Recommendations  None recommended by OT    Recommendations for Other Services    Frequency Min 2X/week   Plan Discharge plan remains appropriate    Precautions / Restrictions Precautions Precautions: Fall   Pertinent Vitals/Pain none    ADL  Eating/Feeding: Performed;Modified independent Where Assessed - Eating/Feeding: Bed level Grooming: Simulated;Set up;Wash/dry hands;Wash/dry face ADL Comments: Treatment limited due to fatigue. Discussed need for pt to use BSC only and No bed pans to assist with transition to ALF. Pt agreed. nsg aware. Also discssed need for pt to do more of ADL.    OT Diagnosis:    OT Problem List:   OT Treatment Interventions:     OT Goals Acute Rehab OT Goals OT Goal Formulation: With patient Time For Goal Achievement: 04/19/12 Potential to Achieve Goals: Good ADL Goals Pt Will Perform Grooming: with supervision;Standing at sink ADL Goal: Grooming - Progress: Progressing toward goals Pt Will Perform Lower Body Bathing: with supervision;Sit to stand in shower;Sit to stand from chair ADL Goal: Lower Body Bathing - Progress: Progressing toward goals Pt Will Perform Upper Body Dressing: with set-up;Sitting, bed;Sitting, chair ADL Goal: Upper Body Dressing - Progress: Progressing toward goals Pt Will Perform Lower Body Dressing: with supervision;Sit to stand from  bed;Sit to stand from chair;with set-up ADL Goal: Lower Body Dressing - Progress: Progressing toward goals Pt Will Transfer to Toilet: with supervision;Ambulation;with DME ADL Goal: Toilet Transfer - Progress: Progressing toward goals Pt Will Perform Toileting - Clothing Manipulation: with supervision;Sitting on 3-in-1 or toilet;Standing ADL Goal: Toileting - Clothing Manipulation - Progress: Progressing toward goals Pt Will Perform Tub/Shower Transfer: Shower transfer;with min assist;Ambulation;with DME ADL Goal: Tub/Shower Transfer - Progress: Progressing toward goals Additional ADL Goal #1: Pt will tolerate functional activities taking less than 3, rest breaks as needed.  ADL Goal: Additional Goal #1 - Progress: Progressing toward goals  Visit Information  Last OT Received On: 04/09/12 Assistance Needed: +1    Subjective Data                                         GO     Orvel Cutsforth,[name] 04/09/2012, 6:11 PM [facility], OTR/L  [phone] 04/09/2012

## 2012-04-09 NOTE — Progress Notes (Signed)
ATTENDING ADDENDUM:  I personally reviewed patient's record, examined the patient, and formulated the following plan:  Pt starting to have bowel function.  Will advance diet slowly.  Ok to start decreasing TPN.

## 2012-04-09 NOTE — Progress Notes (Signed)
Physical Therapy Treatment Patient Details Name: Bianca Khan MRN: [id number] DOB: [birth year] Today's Date: 04/09/2012 Time: 5784-6962 PT Time Calculation (min): 44 min  PT Assessment / Plan / Recommendation Comments on Treatment Session  s/p exp lap and lysis of adhesions; Requiring max encouragement to participate, and to work on progressive ambulation;   Pt very motivated to go back to ALF, and we emphasized to her the need to increase her activity to be abl eto dc back to ALF; To her credit, [name] managed good hallway distance without loss of balance, and managed transfers with close guard, but no physical assist;   Feel like dc back to pt's ALF(with HHPT/OT follow-up) is our most therapeutic option, as she is moving relatively well, and especially as pt reports she has gone to SNF for rehab before and "hated it", and would likely refuse SNF anyway;  Is [facility] able to amp up services a bit to more supervision for mobility tasks at first (and HHtherapies can safely wean assist)?    Follow Up Recommendations  Home health PT (with return to [facility])     Does the patient have the potential to tolerate intense rehabilitation     Barriers to Discharge        Equipment Recommendations  Rolling walker with 5" wheels    Recommendations for Other Services    Frequency Min 3X/week   Plan Discharge plan remains appropriate    Precautions / Restrictions Precautions Precautions: Fall Precaution Comments: h/o falls with hip fractures   Pertinent Vitals/Pain no apparent distress     Mobility  Transfers Transfers: Sit to Stand;Stand to Sit Sit to Stand: 4: Min guard;From bed;From chair/3-in-1;With upper extremity assist Stand to Sit: 4: Min guard;To chair/3-in-1;With upper extremity assist Details for Transfer Assistance: Cues for safety and hand placement; Did not need physical assist Ambulation/Gait Ambulation/Gait Assistance: 4: Min guard (with and without  physical contact) Ambulation Distance (Feet): 220 Feet Assistive device: Rolling walker Ambulation/Gait Assistance Details: cues for posture, to look up during amb, and for RW proximity; No losses of balance noted; Maintained close guard as this was the first time pt had been progressively ambulating since surgery Gait Pattern: Step-through pattern    Exercises     PT Diagnosis:    PT Problem List:   PT Treatment Interventions:     PT Goals Acute Rehab PT Goals Time For Goal Achievement: 04/15/12 Potential to Achieve Goals: Good Pt will go Supine/Side to Sit: with modified independence;with HOB 0 degrees Pt will go Sit to Stand: with modified independence PT Goal: Sit to Stand - Progress: Progressing toward goal Pt will go Stand to Sit: with modified independence PT Goal: Stand to Sit - Progress: Progressing toward goal Pt will Ambulate: >150 feet;with modified independence;with rolling walker PT Goal: Ambulate - Progress: Progressing toward goal  Visit Information  Last PT Received On: 04/09/12 Assistance Needed: +1    Subjective Data  Subjective: Really wanting to go back to her ALF intead of SNF for rehab Patient Stated Goal: to go back to ALF   Cognition  Overall Cognitive Status: Appears within functional limits for tasks assessed/performed Arousal/Alertness: Awake/alert Orientation Level: Appears intact for tasks assessed Behavior During Session: [facility] for tasks performed    Balance     End of Session PT - End of Session Activity Tolerance: Patient tolerated treatment well Patient left: in chair;with call bell/phone within reach;with family/visitor present Nurse Communication: Mobility status   GP  Van Clines [place], [place] [phone]  04/09/2012, 2:04 PM

## 2012-04-09 NOTE — Progress Notes (Addendum)
[facility] Progress Note    Bianca Khan UEA:[id number] DOB: 1915/04/24 DOA: 03/23/2012 PCP: Enrique Sack, MD  Brief narrative: Pt is a 78 year old female patient admitted 03/23/2012 with partial small bowel obstruction. This required NG tube decompression and patient apparently did well for 2 days after the NG tube was discontinued but unfortunately redeveloped bowel obstruction. Surgery was consulted. Patient subsequently underwent exploratory laparotomy with lysis of adhesions on 04/04/2012 and was sent to the step down unit for further monitoring and treatment post operatively he she remained through 10/7 and was transferred back to floor.   Assessment/Plan: Principal Problem:  *Partial small bowel obstruction s/p surgery *Appreciate surgical team assistance * NG tube discontinued 04/07/2012. Patient endorses flatus. Tolerating tolerating clears, and on TPN *Mobilizing, PT today recommending back to ALF with home health PT when medically ready * abdominal films on 10/7 with scattered air-fluid levels in small and large while favoring ileus *Surgery to follow for further recommendations.  *Blood per NG Tube *Likely NG induced trauma. Bleeding has stopped and NG tube was removed *Hb stable today, follow.  *Will repeat in AM. No known history of PUD.   *Continue PPI. If she has recurrence of bleeding, she will need GI evaluation.   Active Problems:  Dehydration/Hypokalemia *Resolved, follow.   Tachycardia *Resolved, Heart rate has been less than 100 since midnight 04/08/2012 *was Likely related to dehydration but could be related to pain *On scheduled low-dose Lopressor every 8 hours IV, follow and change back  to oral meds (Cardizem CD-see below) continue to tolerate by mouth well *Discontinued Cardizem CD per NG tube since not effective in this setting,   Diabetes mellitus type 2, controlled *On metformin prior to admission, discontinued indefinitely *CBGs in normal  range *Will resume by mouth meds when appropriate.   Hypertension *BP stable today, follow and resume outpatient meds as above when appropriate.  CAD (coronary artery disease) on Pradaxa pre-op *Currently no clinical signs of ischemia and patient denies chest pain *follow and resume pradaxa when ok per surgery   Chronic diastolic congestive heart failure, NYHA class 1 (ECHO 2008) *Currently compensated, euvolemic-resolving dehydration   Chronic Leukocytosis/Chronic Thrombocytosis *Has been evaluated by hematology in the past - abnormalities felt to be reactive in nature *improving, Suspect has acute reactive component do to acute surgical procedure *Repeat CBC in a.m.   COPD (chronic obstructive pulmonary disease)/Pulmonary HTN - 41 mmHg *Compensated *Was on Spiriva the and Mucinex prior to admission *Continue nebulizers *Chest x-ray 10/7 with no acute infiltrates *continue flutter valve   Code Status: LIMITED CODE (Changed on 10/6-per Dr. Phillips Odor with Patient is AOX3- they discussed advanced planning and code status in detail, "I do not want a prolonged or painful death" She has a living will.  She was asked permission to talk to her sons but she said that she "knows what she wants". This status of course does not preclude her from aggressive treatment of reversible conditions or full scope medical care. This has been designated LIMITED as an appropriate status since she may need medications for BP and dysrythmias. Given her advanced age and frailty, her chances for surviving ACLS/CPR are less than 1%. Family Communication: Spoke directly with patient Disposition Plan: ALF with PT when medically ready  Consultants: General surgery  Procedures: Exploratory laparotomy with lysis of adhesions and release of small bowel traction in setting of recurrent small bowel obstruction on 04/04/2012  Antibiotics: None  HPI/Subjective: + flatus, denies abdominal pain, no nausea or vomiting.  Objective: Blood pressure 155/79, pulse 89, temperature 98.4 F (36.9 C), temperature source Oral, resp. rate 18, height 5' 3.5" (1.613 m), weight 44.589 kg (98 lb 4.8 oz), SpO2 98.00%.  Intake/Output Summary (Last 24 hours) at 04/09/12 1220 Last data filed at 04/09/12 1219  Gross per 24 hour  Intake    600 ml  Output   3075 ml  Net  -2475 ml     Exam: General: AOX3, Frail, No acute respiratory distress Lungs: Decreased breath sounds at the bases, no crackles or wheezes Cardiovascular: Regular rate and rhythm without murmur gallop or rub, no tachycardia, IV fluid at 50 cc per Abdomen: Nontender, nondistended, soft, +bowel sounds, no rebound, no ascites, no appreciable mass,  Musculoskeletal: No significant cyanosis, clubbing of bilateral lower extremities Neurological: Alert and oriented x3 moves all extremities, exam non focal  Data Reviewed: Basic Metabolic Panel:  Lab 04/09/12 4098 04/08/12 0538 04/07/12 0501 04/06/12 0355 04/05/12 0500  NA 141 138 138 139 140  K 3.7 3.5 3.1* 3.2* 3.0*  CL 108 105 104 105 102  CO2 27 28 27 25 21   GLUCOSE 101* 92 131* 232* 83  BUN 9 10 10 11 11   CREATININE 0.49* 0.46* 0.49* 0.55 0.57  CALCIUM 7.8* 7.4* 7.0* 7.1* 7.4*  MG 1.8 1.5 1.8 1.1* --  PHOS 2.5 2.4 2.2* 1.6* --   Liver Function Tests:  Lab 04/08/12 0538 04/06/12 0355  AST 12 14  ALT 7 7  ALKPHOS 53 68  BILITOT 0.4 0.4  PROT 4.4* 4.7*  ALBUMIN 1.9* 2.2*   No results found for this basename: LIPASE:5,AMYLASE:5 in the last 168 hours No results found for this basename: AMMONIA:5 in the last 168 hours CBC:  Lab 04/09/12 0515 04/07/12 0501 04/06/12 0355 04/05/12 1342 04/05/12 0500  WBC 13.6* 16.3* 20.9* 29.2* 23.2*  NEUTROABS -- -- -- -- --  HGB 10.6* 11.3* 12.1 13.6 13.0  HCT 32.2* 33.2* 35.7* 40.7 38.8  MCV 90.4 89.7 90.2 91.5 90.2  PLT 395 430* 477* 452* 507*   CBG:  Lab 04/09/12 0726 04/09/12 0607 04/09/12 0022 04/08/12 1915 04/08/12 1212  GLUCAP 125* 113* 111*  157* 163*    Recent Results (from the past 240 hour(s))  SURGICAL PCR SCREEN     Status: Normal   Collection Time   04/03/12  6:55 PM      Component Value Range Status Comment   MRSA, PCR NEGATIVE  NEGATIVE Final    Staphylococcus aureus NEGATIVE  NEGATIVE Final      Kela Millin MD 04/09/2012 [phone] (Pager)   If 7PM-7AM, please contact night-coverage [email or web address] Password [email or web address] 04/09/2012, 12:20 PM   LOS: 17 days

## 2012-04-09 NOTE — Progress Notes (Signed)
Chaplain note: I was walking by when I noticed patient's call light was on.  I went in and introduced myself and asked if I could help.  Patient stated she needed a bedpan.  Chaplain offered to relay request to a nurse for patient...checked back with patient a few minutes later to let her know someone would be there for her soon.  Patient expressed gratitude.  Rutherford Nail Chaplain

## 2012-04-09 NOTE — Progress Notes (Signed)
PARENTERAL NUTRITION CONSULT NOTE - Follow Up  Pharmacy Consult: TPN Indication: PCM  No Known Allergies  Patient Measurements: Height: 5' 3.5" (161.3 cm) Weight: 98 lb 4.8 oz (44.589 kg) (bed scale) IBW/kg (Calculated) : 53.55  Adjusted Body Weight: 33 kg Usual Weight: 33 kg  Vital Signs: Temp: 97.3 F (36.3 C) (10/08 0530) Temp src: Oral (10/08 0530) BP: 134/80 mmHg (10/08 0530) Pulse Rate: 94  (10/08 0530) Intake/Output from previous day: 10/07 0701 - 10/08 0700 In: 680 [P.O.:120; I.V.:250; IV Piggyback:110; TPN:200] Out: 2950 [Urine:2950]  Labs:  [facility] 04/09/12 0515 04/07/12 0501  WBC 13.6* 16.3*  HGB 10.6* 11.3*  HCT 32.2* 33.2*  PLT 395 430*  APTT -- --  INR -- --     Basename 04/09/12 0515 04/08/12 0538 04/07/12 0501  NA 141 138 138  K 3.7 3.5 3.1*  CL 108 105 104  CO2 27 28 27   GLUCOSE 101* 92 131*  BUN 9 10 10   CREATININE 0.49* 0.46* 0.49*  LABCREA -- -- --  CREAT24HRUR -- -- --  CALCIUM 7.8* 7.4* 7.0*  MG 1.8 1.5 1.8  PHOS 2.5 2.4 2.2*  PROT -- 4.4* --  ALBUMIN -- 1.9* --  AST -- 12 --  ALT -- 7 --  ALKPHOS -- 53 --  BILITOT -- 0.4 --  BILIDIR -- -- --  IBILI -- -- --  PREALBUMIN -- 5.0* --  TRIG -- 93 --  CHOLHDL -- -- --  CHOL -- -- --   Estimated Creatinine Clearance: 29.6 ml/min (by C-G formula based on Cr of 0.49).    Basename 04/09/12 0726 04/09/12 0607 04/09/12 0022  GLUCAP 125* 113* 111*    Medical History: Past Medical History  Diagnosis Date  . COPD (chronic obstructive pulmonary disease)   . CHF (congestive heart failure)   . Edema   . Myocardial infarction   . Anxiety   . Hypertension   . Anemia   . Leukocytosis   . Acid reflux     Insulin Requirements in the past 24 hours:  6 units SSI + 13 units in TPN  Current Nutrition:  Full liquid diet + TPN   Assessment:  78 y/o (only 33 kg) assisted living resident with recurrent small bowel obstruction secondary to adhesions and internal hernia. S/p ex-lap  with LOA 04/04/12 and patient continues on TPN for nutritional support.  Noted diet is changed to clear liquid.  GI: s/p LOA for SBO, post-op ileus, hx GERD on IV PPI.  NGT d/c'ed 10/6, +flatus Anticoag: has bloody NG O/P and Lovenox d/c'ed.  H/H/plts ok Lytes: K+ low end of normal post supplementation (goal K+ ~4 for ileus), others WNL Hepatobiliary: LFTs / tbili / TG WNL ID: Afebrile. WBC trending down - not on abx Endo: CBGs adequately controlled Renal: SCr stable, CrCL 22 ml/min,  NS at 50 ml/hr, excellent UOP Pulm: hx COPD on Spiriva, stable on RA Cards: hx HTN / Afib (on Pradaxa PTA) - BP normal, HR variable 73-106, on IV metoprolol Neuro: A&O Best Practices: SCDs, MC  Nutritional needs:  [phone] kcal, 30-40g protein, ~1.2 L/day    Plan:  - Continue Clinimix 4.25/25 at goal rate of 48ml/hr, providing 100% of needs - Continue insulin at 13 units in TPN - Lipids on MWF due to national backorder. - F/U PO intake to start weaning TPN - Start PO multivitamin (no multivitamin or trace elements in TPN)    Mike Hamre D. Laney Potash, PharmD, BCPS Pager:  [phone] 04/09/2012,  10:16 AM

## 2012-04-09 NOTE — Progress Notes (Signed)
Patient ID: Bianca Khan, female   DOB: 09-13-1914, 78 y.o.   MRN: [id number] 5 Days Post-Op  Subjective: Doing well overall, tolerating diet and would like more to eat, +flatus but no bm, denies significant pain  Objective: Vital signs in last 24 hours: Temp:  [97.3 F (36.3 C)-98.6 F (37 C)] 97.3 F (36.3 C) (10/08 0530) Pulse Rate:  [81-106] 94  (10/08 0530) Resp:  [18-27] 18  (10/08 0530) BP: (121-153)/(71-80) 134/80 mmHg (10/08 0530) SpO2:  [95 %-98 %] 98 % (10/08 0821) Weight:  [98 lb 4.8 oz (44.589 kg)] 98 lb 4.8 oz (44.589 kg) (10/07 2053) Last BM Date: 04/03/12  Intake/Output from previous day: 10/07 0701 - 10/08 0700 In: 680 [P.O.:120; I.V.:250; IV Piggyback:110; TPN:200] Out: 2950 [Urine:2950] Intake/Output this shift:   Physical Exam: Lungs clear Abdomen soft, Non-distended, +BS, minimal pain around incision Incision clean  Lab Results:   Basename 04/09/12 0515 04/07/12 0501  WBC 13.6* 16.3*  HGB 10.6* 11.3*  HCT 32.2* 33.2*  PLT 395 430*   BMET  Basename 04/09/12 0515 04/08/12 0538  NA 141 138  K 3.7 3.5  CL 108 105  CO2 27 28  GLUCOSE 101* 92  BUN 9 10  CREATININE 0.49* 0.46*  CALCIUM 7.8* 7.4*   PT/INR No results found for this basename: LABPROT:2,INR:2 in the last 72 hours ABG No results found for this basename: PHART:2,PCO2:2,PO2:2,HCO3:2 in the last 72 hours  Studies/Results: Dg Abd Acute W/chest  04/08/2012  *RADIOLOGY REPORT*  Clinical Data: abd soreness, ? early PNA on right-follow post op ileus  ACUTE ABDOMEN SERIES (ABDOMEN 2 VIEW & CHEST 1 VIEW)  Comparison: Multiple exams, including 04/05/2012  Findings: Right PICC line tip projects over lower SVC.  Moderate sized hiatal hernia.  Blunted costophrenic angles suggesting small bilateral pleural effusions.  Right rib deformities related to old fractures.  Bibasilar nodular densities are symmetric and compatible with nipple shadows. The patient is rotated to the left on today's exam,  resulting in reduced diagnostic sensitivity and specificity.  Attenuated peripheral pulmonary vasculature is compatible with emphysema/COPD.  Heart size within normal limits.  Midline skin clips noted.  The borderline dilated loop of left abdominal small bowel contains air fluid levels.  Air-fluid levels are present in the descending colon and in a loop of right-sided bowel which may represent cecum or less likely dilated small bowel.  Dynamic hip screw noted on the right and cannulated screws are present in the left hip.  Prior nasogastric tube has been removed.  IMPRESSION:  1.  Scattered air-fluid levels in small and large bowel favoring ileus.  There is a borderline dilated loop of small bowel in the left abdomen. 2.  Suspected emphysema. 3.  Small bilateral pleural effusions. 4.  Moderate sized hiatal hernia.   Original Report Authenticated By: Dellia Cloud, M.D.     Anti-infectives: Anti-infectives     Start     Dose/Rate Route Frequency Ordered Stop   04/04/12 0600   cefOXitin (MEFOXIN) 2 g in dextrose 5 % 50 mL IVPB        2 g 100 mL/hr over 30 Minutes Intravenous 60 min pre-op 04/03/12 1419 04/04/12 1204          Assessment/Plan: s/p Procedure(s) (LRB) with comments: EXPLORATORY LAPAROTOMY (N/A) LYSIS OF ADHESION () - for SBO  1. POD#5-Exp Lap: doing well, tolerating clears well and wants to more to eat, +flatus, will allow fulls.  Amublate, On TNA and will be able to start  weaning this as her PO intact improves.  Need to start discharge planning.     LOS: 17 days    Juliocesar Blasius 04/09/2012

## 2012-04-09 NOTE — Progress Notes (Signed)
Utilization review completed.  

## 2012-04-10 LAB — BASIC METABOLIC PANEL
BUN: 9 mg/dL (ref 6–23)
CO2: 27 mEq/L (ref 19–32)
Calcium: 8.2 mg/dL — ABNORMAL LOW (ref 8.4–10.5)
Creatinine, Ser: 0.53 mg/dL (ref 0.50–1.10)
Glucose, Bld: 149 mg/dL — ABNORMAL HIGH (ref 70–99)

## 2012-04-10 LAB — GLUCOSE, CAPILLARY
Glucose-Capillary: 146 mg/dL — ABNORMAL HIGH (ref 70–99)
Glucose-Capillary: 133 mg/dL — ABNORMAL HIGH (ref 70–99)

## 2012-04-10 LAB — CBC
MCH: 29.7 pg (ref 26.0–34.0)
MCHC: 32.7 g/dL (ref 30.0–36.0)
MCV: 90.7 fL (ref 78.0–100.0)
Platelets: 401 10*3/uL — ABNORMAL HIGH (ref 150–400)
RDW: 12.9 % (ref 11.5–15.5)

## 2012-04-10 MED ORDER — ENOXAPARIN SODIUM 30 MG/0.3ML ~~LOC~~ SOLN
30.0000 mg | SUBCUTANEOUS | Status: DC
  Administered 2012-04-10: 30 mg via SUBCUTANEOUS
  Filled 2012-04-10 (×3): qty 0.3

## 2012-04-10 MED ORDER — INSULIN REGULAR HUMAN 100 UNIT/ML IJ SOLN
INTRAVENOUS | Status: DC
  Administered 2012-04-10: 18:00:00 via INTRAVENOUS
  Filled 2012-04-10: qty 2000

## 2012-04-10 MED ORDER — POTASSIUM CHLORIDE 20 MEQ/15ML (10%) PO LIQD
40.0000 meq | Freq: Once | ORAL | Status: AC
  Administered 2012-04-10: 40 meq via ORAL
  Filled 2012-04-10: qty 30

## 2012-04-10 MED ORDER — FAT EMULSION 20 % IV EMUL
250.0000 mL | INTRAVENOUS | Status: DC
  Administered 2012-04-10: 250 mL via INTRAVENOUS
  Filled 2012-04-10: qty 250

## 2012-04-10 MED ORDER — BOOST / RESOURCE BREEZE PO LIQD
1.0000 | Freq: Two times a day (BID) | ORAL | Status: DC
  Administered 2012-04-10: 1 via ORAL

## 2012-04-10 NOTE — Progress Notes (Signed)
Occupational Therapy Treatment Patient Details Name: Bianca Khan MRN: [id number] DOB: 06/09/1915 Today's Date: 04/10/2012 Time: 1027-2536 OT Time Calculation (min): 40 min  OT Assessment / Plan / Recommendation Comments on Treatment Session Excellent progress. Completed entire ADL with pt. Pt @ overall S to mod I with ADL @ RW level. Pt appropriate to return to ALF. PT will need a youth RW with wheels prior to D/C. Pt will need HHOT services. Discussed D/C plan with pt and pt's daughter inlaw.    Follow Up Recommendations  Other (comment);Home health OT (HHOT at ALF)    Barriers to Discharge   none    Equipment Recommendations  Rolling walker with 5" wheels;Other (comment) (youth, bedside rail)    Recommendations for Other Services  none  Frequency Min 2X/week   Plan Discharge plan needs to be updated    Precautions / Restrictions Precautions Precautions: Fall Restrictions Weight Bearing Restrictions: No   Pertinent Vitals/Pain No c/o pain    ADL  Grooming: Performed;Modified independent Where Assessed - Grooming: Unsupported standing Upper Body Bathing: Performed;Supervision/safety Where Assessed - Upper Body Bathing: Unsupported standing Lower Body Bathing: Performed;Supervision/safety Where Assessed - Lower Body Bathing: Unsupported sit to stand Upper Body Dressing: Performed;Supervision/safety Where Assessed - Upper Body Dressing: Unsupported sitting Lower Body Dressing: Performed;Supervision/safety Where Assessed - Lower Body Dressing: Supported sit to stand Toilet Transfer: Research scientist (life sciences) Method: Sit to Barista: Bedside commode Toileting - Architect and Hygiene: Performed;Modified independent Where Assessed - Toileting Clothing Manipulation and Hygiene: Standing Transfers/Ambulation Related to ADLs: S RW level ADL Comments: Excellent performance. Cofortable with pt reutrning to ALF    OT  Diagnosis:    OT Problem List:   OT Treatment Interventions:     OT Goals Acute Rehab OT Goals OT Goal Formulation: With patient Time For Goal Achievement: 04/19/12 Potential to Achieve Goals: Good ADL Goals Pt Will Perform Grooming: with supervision;Standing at sink ADL Goal: Grooming - Progress: Met Pt Will Perform Lower Body Bathing: with supervision;Sit to stand in shower;Sit to stand from chair ADL Goal: Lower Body Bathing - Progress: Met Pt Will Perform Upper Body Dressing: with set-up;Sitting, bed;Sitting, chair ADL Goal: Upper Body Dressing - Progress: Met Pt Will Perform Lower Body Dressing: with supervision;Sit to stand from bed;Sit to stand from chair;with set-up ADL Goal: Lower Body Dressing - Progress: Met Pt Will Transfer to Toilet: with supervision;Ambulation;with DME ADL Goal: Toilet Transfer - Progress: Met Pt Will Perform Toileting - Clothing Manipulation: with supervision;Sitting on 3-in-1 or toilet;Standing ADL Goal: Toileting - Clothing Manipulation - Progress: Met Pt Will Perform Tub/Shower Transfer: Shower transfer;with min assist;Ambulation;with DME ADL Goal: Web designer - Progress: Met Additional ADL Goal #1: Pt will tolerate functional activities taking less than 3, rest breaks as needed.  ADL Goal: Additional Goal #1 - Progress: Met  Visit Information  Last OT Received On: 04/10/12 Assistance Needed: +1    Subjective Data      Prior Functioning       Cognition  Overall Cognitive Status: Appears within functional limits for tasks assessed/performed Arousal/Alertness: Awake/alert Orientation Level: Appears intact for tasks assessed Behavior During Session: [facility] for tasks performed    Mobility  Shoulder Instructions Bed Mobility Bed Mobility: Rolling Right;Right Sidelying to Sit;Sitting - Scoot to Edge of Bed Rolling Right: 5: Supervision;With rail Right Sidelying to Sit: HOB flat;5: Supervision Supine to Sit: 5:  Supervision Sitting - Scoot to Edge of Bed: 5: Supervision Transfers Transfers: Sit to Stand;Stand to  Sit Sit to Stand: 5: Supervision;With upper extremity assist;From bed;From chair/3-in-1 Stand to Sit: 5: Supervision;With upper extremity assist;To chair/3-in-1 Details for Transfer Assistance: vc for hand placement       Exercises      Balance Balance Balance Assessed:  (no swaying. no LOB) Static Standing Balance Static Standing - Balance Support: No upper extremity supported Static Standing - Level of Assistance: 6: Modified independent (Device/Increase time) Static Standing - Comment/# of Minutes: 15 min   End of Session OT - End of Session Activity Tolerance: Patient tolerated treatment well Patient left: in chair;with call bell/phone within reach Nurse Communication: Other (comment);Mobility status (D/C plans)  GO     Bianca Khan,[name] 04/10/2012, 10:38 AM [name], OTR/L  [phone] 04/10/2012

## 2012-04-10 NOTE — Progress Notes (Signed)
PARENTERAL NUTRITION CONSULT NOTE - Follow Up  Pharmacy Consult: TPN Indication: PCM  No Known Allergies  Patient Measurements: Height: 5' 3.5" (161.3 cm) Weight: 94 lb (42.638 kg) IBW/kg (Calculated) : 53.55  Adjusted Body Weight: 33 kg Usual Weight: 33 kg  Vital Signs: Temp: 97 F (36.1 C) (10/09 0400) Temp src: Oral (10/09 0400) BP: 140/80 mmHg (10/09 0400) Pulse Rate: 84  (10/09 0400) Intake/Output from previous day: 10/08 0701 - 10/09 0700 In: 4220 [P.O.:1160; I.V.:1500; TPN:1560] Out: 2475 [Urine:2475]  Labs:  [facility] 04/10/12 0500 04/09/12 0515  WBC 12.2* 13.6*  HGB 10.8* 10.6*  HCT 33.0* 32.2*  PLT 401* 395  APTT -- --  INR -- --     Basename 04/10/12 0500 04/09/12 0515 04/08/12 0538  NA 140 141 138  K 3.4* 3.7 3.5  CL 107 108 105  CO2 27 27 28   GLUCOSE 149* 101* 92  BUN 9 9 10   CREATININE 0.53 0.49* 0.46*  LABCREA -- -- --  CREAT24HRUR -- -- --  CALCIUM 8.2* 7.8* 7.4*  MG -- 1.8 1.5  PHOS -- 2.5 2.4  PROT -- -- 4.4*  ALBUMIN -- -- 1.9*  AST -- -- 12  ALT -- -- 7  ALKPHOS -- -- 53  BILITOT -- -- 0.4  BILIDIR -- -- --  IBILI -- -- --  PREALBUMIN -- -- 5.0*  TRIG -- -- 93  CHOLHDL -- -- --  CHOL -- -- --   Estimated Creatinine Clearance: 28.3 ml/min (by C-G formula based on Cr of 0.53).    Basename 04/10/12 0731 04/10/12 0634 04/09/12 2348  GLUCAP 133* 146* 143*    Medical History: Past Medical History  Diagnosis Date  . COPD (chronic obstructive pulmonary disease)   . CHF (congestive heart failure)   . Edema   . Myocardial infarction   . Anxiety   . Hypertension   . Anemia   . Leukocytosis   . Acid reflux     Insulin Requirements in the past 24 hours:  2 units SSI + 13 units in TPN  Current Nutrition:  Full liquid diet + TPN   Assessment:  78 y/o (only 33 kg) assisted living resident with recurrent small bowel obstruction secondary to adhesions and internal hernia. S/p ex-lap with LOA 04/04/12 and patient continues on  TPN for nutritional support.  Diet advanced to full liquid.  Patient reports eating at least 50% of her meals and denies nausea, vomiting, and abdominal pain.  GI: s/p LOA for SBO, post-op ileus, hx GERD on IV PPI.  NGT d/c'ed 10/6, +flatus Anticoag: had bloody NG O/P and Lovenox d/c'ed.  H/H/plts ok Lytes: K+ decreased (goal K+ ~4 for ileus), others WNL Hepatobiliary: LFTs / tbili / TG WNL ID: Afebrile. WBC trending down - not on abx Endo: CBGs adequately controlled, trending up with PO intake Renal: SCr stable, CrCL 22 ml/min,  NS at 50 ml/hr, excellent UOP Pulm: hx COPD on Spiriva, stable on RA Cards: hx HTN / Afib (on Pradaxa PTA) - BP normal, HR better controlled, on IV metoprolol Neuro: A&O Best Practices: SCDs, MC  Nutritional needs:  [phone] kcal, 30-40g protein, ~1.2 L/day    Plan:  - Decrease Clinimix 4.25/25 to 25 ml/hr (goal rate 25ml/hr) with increased PO intake - Continue with 13 units regular insulin in TPN - Lipids on MWF due to national backorder. - Start PO multivitamin (no multivitamin or trace elements in TPN) - F/U PO intake to d/c TPN, hopefully in AM.  Adric Wrede D. Laney Potash, PharmD, BCPS Pager:  [phone] 04/10/2012, 10:00 AM

## 2012-04-10 NOTE — Progress Notes (Signed)
Nutrition Follow-up  Intervention:   Resource Breeze po BID, each supplement provides 250 kcal and 9 grams of protein. RD to continue to follow nutrition care plan.  Assessment:   NGT d/c'd on 10/6. Clear liquids started on 10/7, advanced to full liquids yesterday. Pt stated that she feels as though she is tolerating her meals well, would like more choices. Does not like Ensure, agreeable to [place].  Patient has been receiving TPN with Clinimix 4.25/25 @ 40 ml/hr, providing 100% of estimated protein and calorie needs. Noted today, TPN pharmacist is decreasing Clinimix 4.25/25 to 25 ml/hr as pt has increased PO intake. Also initiated PO MVI.  Lipids (20% IVFE @ 5 ml/hr), multivitamins, and trace elements are provided 3 times weekly (MWF) due to national backorder.  Provides 715 kcal and 26 grams protein daily (based on weekly average).  Meets 75% minimum estimated kcal and 87% minimum estimated protein needs. Noted pt has received potassium, phosphorus, and magnesium repletion since initiation of TPN.  Diet Order:  NPO  Meds: Scheduled Meds:    . antiseptic oral rinse  15 mL Mouth Rinse q12n4p  . chlorhexidine  15 mL Mouth Rinse BID  . insulin aspart  0-9 Units Subcutaneous Q6H  . metoprolol  2.5 mg Intravenous Q8H  . multivitamin with minerals  1 tablet Oral Daily  . pantoprazole (PROTONIX) IV  40 mg Intravenous Q12H  . potassium chloride  40 mEq Oral Once  . sodium chloride  10-40 mL Intracatheter Q12H  . tiotropium  18 mcg Inhalation Daily   Continuous Infusions:    . sodium chloride 1,000 mL (04/10/12 0647)  . fat emulsion 250 mL (04/08/12 1723)  . fat emulsion    . TPN (CLINIMIX) +/- additives 40 mL/hr at 04/08/12 1722  . TPN (CLINIMIX) +/- additives 40 mL/hr at 04/09/12 1735  . TPN (CLINIMIX) +/- additives     PRN Meds:.acetaminophen, albuterol, morphine injection, ondansetron (ZOFRAN) IV, ondansetron, promethazine, ranitidine, sodium chloride  Labs:  CMP     Component  Value Date/Time   NA 140 04/10/2012 0500   K 3.4* 04/10/2012 0500   CL 107 04/10/2012 0500   CO2 27 04/10/2012 0500   GLUCOSE 149* 04/10/2012 0500   BUN 9 04/10/2012 0500   CREATININE 0.53 04/10/2012 0500   CALCIUM 8.2* 04/10/2012 0500   PROT 4.4* 04/08/2012 0538   ALBUMIN 1.9* 04/08/2012 0538   AST 12 04/08/2012 0538   ALT 7 04/08/2012 0538   ALKPHOS 53 04/08/2012 0538   BILITOT 0.4 04/08/2012 0538   GFRNONAA 78* 04/10/2012 0500   GFRAA >90 04/10/2012 0500   Sodium  Date/Time Value Range Status  04/10/2012  5:00 AM 140  135 - 145 mEq/L Final  04/09/2012  5:15 AM 141  135 - 145 mEq/L Final  04/08/2012  5:38 AM 138  135 - 145 mEq/L Final    Potassium  Date/Time Value Range Status  04/10/2012  5:00 AM 3.4* 3.5 - 5.1 mEq/L Final  04/09/2012  5:15 AM 3.7  3.5 - 5.1 mEq/L Final  04/08/2012  5:38 AM 3.5  3.5 - 5.1 mEq/L Final    Phosphorus  Date/Time Value Range Status  04/09/2012  5:15 AM 2.5  2.3 - 4.6 mg/dL Final  16/07/958  4:54 AM 2.4  2.3 - 4.6 mg/dL Final  03/10/1190  4:78 AM 2.2* 2.3 - 4.6 mg/dL Final    Magnesium  Date/Time Value Range Status  04/09/2012  5:15 AM 1.8  1.5 - 2.5 mg/dL Final  29/11/6211  5:38 AM 1.5  1.5 - 2.5 mg/dL Final  16/07/958  4:54 AM 1.8  1.5 - 2.5 mg/dL Final    Intake/Output Summary (Last 24 hours) at 04/10/12 1134 Last data filed at 04/10/12 0900  Gross per 24 hour  Intake   4100 ml  Output   2550 ml  Net   1550 ml   Last BM: 10/9  Weight Status:  Admission wt: 66 lbs 94 lbs 10/8 72 lbs 10/4 76 lbs 10/3 68 lbs 10/1  Estimated needs: [phone] kcal, 30-40g protein, ~1.2 L/day  Nutrition Dx:  Inadequate oral intake now r/t altered gi function AEB post op ileus/NPO status. Ongoing  Goal: Pt to meet >/= 90% of their estimated nutrition needs; progressing  Monitor: weight trends, labs, ability to advance diet  Jarold Motto MS, RD, LDN Pager: [phone] After-hours pager: [phone]

## 2012-04-10 NOTE — Clinical Social Work Note (Signed)
Contact made with Tonya at [facility] regarding patient progress. Faxing of updated clinicals declined at this time, however Archie Patten will come to hospital on 10/10 (Thursday) to do an assessment of patient to assure her that patient will be appropriate to return to ALF at discharge.  Genelle Bal, MSW, LCSW [phone]

## 2012-04-10 NOTE — Progress Notes (Signed)
[facility] PROGRESS NOTE  Bianca Khan UJW:[id number] DOB: February 18, 1915 DOA: 03/23/2012 PCP: Enrique Sack, MD  Brief narrative:   78 year old female patient admitted 03/23/2012 with partial small bowel obstruction. This required NG tube decompression and patient apparently did well for 2 days after the NG tube was discontinued but unfortunately redeveloped bowel obstruction. Surgery was consulted. Patient subsequently underwent exploratory laparotomy with lysis of adhesions on 04/04/2012 and was sent to the step down unit for further monitoring and treatment post operatively he she remained through 10/7 and was transferred back to floor.   Assessment/Plan:  Principal Problem:  *Partial small bowel obstruction s/p surgery  -Appreciate surgical team assistance  - NG tube discontinued 04/07/2012. Patient now having bowel movement. Tolerating clears, and on TPN  -Mobilizing, PT  recommending back to ALF with home health PT when medically ready  - abdominal films on 10/7 with scattered air-fluid levels in small and large while favoring ileus  -Surgery to follow for further recommendations.  -Will slowly advance her diet tomorrow.  *Blood per NG Tube  *Likely NG induced trauma. Bleeding has stopped and NG tube was removed  *Hb stable -Continue PPI  Dehydration/Hypokalemia  *Resolved,   Tachycardia  *Resolved, Heart rate has been less than 100 since midnight 04/08/2012  *was Likely related to dehydration but could be related to pain  *On scheduled low-dose Lopressor every 8 hours IV -Will resume Cardizem CD continue to tolerate by mouth  Diabetes mellitus type 2, controlled  *On metformin prior to admission, discontinued indefinitely  *CBGs in normal range  *Will resume by mouth meds when appropriate.   Hypertension  *BP stable today, serum outpatient meds once tolerating by mouth  CAD (coronary artery disease) on Pradaxa pre-op  *Currently no clinical signs of ischemia and  patient denies chest pain  *follow and resume pradaxa when ok per surgery   Chronic diastolic congestive heart failure, NYHA class 1 (ECHO 2008)  *Currently compensated, euvolemic-resolving dehydration   Chronic Leukocytosis/Chronic Thrombocytosis  *Has been evaluated by hematology in the past - abnormalities felt to be reactive in nature  *improving, Suspect has acute reactive component do to acute surgical procedure   -Slowly improved in a.m. labs  COPD (chronic obstructive pulmonary disease)/Pulmonary HTN - 41 mmHg  *Compensated  *Was on Spiriva the and Mucinex prior to admission  *Continue nebulizers  *Chest x-ray 10/7 with no acute infiltrates  *continue flutter valve   Code Status: LIMITED CODE (Changed on 10/6-per Dr. Phillips Odor with Patient is AOX3- they discussed advanced planning and code status in detail, "I do not want a prolonged or painful death" She has a living will. She was asked permission to talk to her sons but she said that she "knows what she wants". This status of course does not preclude her from aggressive treatment of reversible conditions or full scope medical care. This has been designated LIMITED as an appropriate status since she may need medications for BP and dysrythmias.   Family Communication: Updated plan the patient and her niece at bedside Disposition Plan: ALF with PT when medically ready   Consultants:  General surgery   Procedures:  Exploratory laparotomy with lysis of adhesions and release of small bowel traction in setting of recurrent small bowel obstruction on 04/04/2012   Antibiotics:  None   HPI/Subjective:  Abdominal pain improving. No nausea or vomiting. Had a bowel movement today.     Objective: Filed Vitals:   04/09/12 2145 04/10/12 0400 04/10/12 0832 04/10/12 1324  BP:  140/79 140/80 138/60 141/68  Pulse: 91 84 84 85  Temp: 98.8 F (37.1 C) 97 F (36.1 C) 97.7 F (36.5 C) 98.4 F (36.9 C)  TempSrc: Oral Oral Oral Oral    Resp: 18 18 18 18   Height:      Weight:      SpO2: 96% 96% 96% 96%    Intake/Output Summary (Last 24 hours) at 04/10/12 1335 Last data filed at 04/10/12 1325  Gross per 24 hour  Intake   4340 ml  Output   2450 ml  Net   1890 ml   Filed Weights   04/08/12 0444 04/08/12 2053 04/09/12 1926  Weight: 33.7 kg (74 lb 4.7 oz) 44.589 kg (98 lb 4.8 oz) 42.638 kg (94 lb)    Exam:   General:  Elderly thin built female lying in bed in no acute distress  HEENT: No pallor, moist oral mucosa  Cardiovascular: Normal S1 and S2 no murmurs rub or gallop  Respiratory: Clear breath sounds bilaterally  Abdomen: Soft , surgical staples intact, nondistended, minimally tender, bowel sounds present  Extremities: Warm, no edema  CNS: AAO x3  Data Reviewed: Basic Metabolic Panel:  Lab 04/10/12 6295 04/09/12 0515 04/08/12 0538 04/07/12 0501 04/06/12 0355  NA 140 141 138 138 139  K 3.4* 3.7 3.5 3.1* 3.2*  CL 107 108 105 104 105  CO2 27 27 28 27 25   GLUCOSE 149* 101* 92 131* 232*  BUN 9 9 10 10 11   CREATININE 0.53 0.49* 0.46* 0.49* 0.55  CALCIUM 8.2* 7.8* 7.4* 7.0* 7.1*  MG -- 1.8 1.5 1.8 1.1*  PHOS -- 2.5 2.4 2.2* 1.6*   Liver Function Tests:  Lab 04/08/12 0538 04/06/12 0355  AST 12 14  ALT 7 7  ALKPHOS 53 68  BILITOT 0.4 0.4  PROT 4.4* 4.7*  ALBUMIN 1.9* 2.2*   No results found for this basename: LIPASE:5,AMYLASE:5 in the last 168 hours No results found for this basename: AMMONIA:5 in the last 168 hours CBC:  Lab 04/10/12 0500 04/09/12 0515 04/07/12 0501 04/06/12 0355 04/05/12 1342  WBC 12.2* 13.6* 16.3* 20.9* 29.2*  NEUTROABS -- -- -- -- --  HGB 10.8* 10.6* 11.3* 12.1 13.6  HCT 33.0* 32.2* 33.2* 35.7* 40.7  MCV 90.7 90.4 89.7 90.2 91.5  PLT 401* 395 430* 477* 452*   Cardiac Enzymes: No results found for this basename: CKTOTAL:5,CKMB:5,CKMBINDEX:5,TROPONINI:5 in the last 168 hours BNP (last 3 results) No results found for this basename: PROBNP:3 in the last 8760  hours CBG:  Lab 04/10/12 1139 04/10/12 0731 04/10/12 0634 04/09/12 2348 04/09/12 1807  GLUCAP 129* 133* 146* 143* 127*    Recent Results (from the past 240 hour(s))  SURGICAL PCR SCREEN     Status: Normal   Collection Time   04/03/12  6:55 PM      Component Value Range Status Comment   MRSA, PCR NEGATIVE  NEGATIVE Final    Staphylococcus aureus NEGATIVE  NEGATIVE Final      Studies: No results found.  Scheduled Meds:   . antiseptic oral rinse  15 mL Mouth Rinse q12n4p  . chlorhexidine  15 mL Mouth Rinse BID  . feeding supplement  1 Container Oral BID BM  . insulin aspart  0-9 Units Subcutaneous Q6H  . metoprolol  2.5 mg Intravenous Q8H  . multivitamin with minerals  1 tablet Oral Daily  . pantoprazole (PROTONIX) IV  40 mg Intravenous Q12H  . potassium chloride  40 mEq Oral Once  .  sodium chloride  10-40 mL Intracatheter Q12H  . tiotropium  18 mcg Inhalation Daily   Continuous Infusions:   . sodium chloride 1,000 mL (04/10/12 0647)  . fat emulsion 250 mL (04/08/12 1723)  . fat emulsion    . TPN (CLINIMIX) +/- additives 40 mL/hr at 04/08/12 1722  . TPN (CLINIMIX) +/- additives 40 mL/hr at 04/09/12 1735  . TPN (CLINIMIX) +/- additives        Time spent: 25 minutes    Bianca Khan  [facility] Pager [phone]. If 8PM-8AM, please contact night-coverage at [email or web address], password [facility] 04/10/2012, 1:35 PM  LOS: 18 days

## 2012-04-10 NOTE — Progress Notes (Signed)
Physical Therapy Treatment Patient Details Name: Bianca Khan MRN: [id number] DOB: June 15, 1915 Today's Date: 04/10/2012 Time: 4098-1191 PT Time Calculation (min): 20 min  PT Assessment / Plan / Recommendation Comments on Treatment Session  Continues to require max encouragement to participate, but still able to progress ambulation distance, and requiring less assist for transfers    Follow Up Recommendations  Home health PT (at ALF)     Does the patient have the potential to tolerate intense rehabilitation     Barriers to Discharge        Equipment Recommendations  Rolling walker with 5" wheels (youth, bedside rail)    Recommendations for Other Services    Frequency Min 3X/week   Plan Discharge plan remains appropriate    Precautions / Restrictions Precautions Precautions: Fall Precaution Comments: h/o falls with hip fractures; managing well with RW Restrictions Weight Bearing Restrictions: No   Pertinent Vitals/Pain no apparent distress     Mobility  Bed Mobility Bed Mobility: Rolling Right;Right Sidelying to Sit;Sitting - Scoot to Edge of Bed Rolling Right: 5: Supervision;With rail Right Sidelying to Sit: HOB flat;5: Supervision Supine to Sit: 5: Supervision Sitting - Scoot to Edge of Bed: 5: Supervision Transfers Transfers: Sit to Stand;Stand to Sit Sit to Stand: 5: Supervision;With upper extremity assist;From bed;From chair/3-in-1 Stand to Sit: 5: Supervision;With upper extremity assist;To chair/3-in-1 Details for Transfer Assistance: Needing occasional VCs for hand placement, at times pulls up on RW, and while so far this hasn't resulted in RW tipping, it is still not optimal Ambulation/Gait Ambulation/Gait Assistance: 4: Min guard (without physical contact) Ambulation Distance (Feet): 300 Feet Assistive device: Rolling walker Ambulation/Gait Assistance Details: Overall moving well; Noted tendency to have RW a bit too far in front of her and less control of amb  speed as pt fatigues slightly with incr distance; Cues for posture and control Gait Pattern: Step-through pattern    Exercises     PT Diagnosis:    PT Problem List:   PT Treatment Interventions:     PT Goals Acute Rehab PT Goals Time For Goal Achievement: 04/15/12 Potential to Achieve Goals: Good Pt will go Sit to Stand: with modified independence PT Goal: Sit to Stand - Progress: Progressing toward goal Pt will go Stand to Sit: with modified independence PT Goal: Stand to Sit - Progress: Progressing toward goal Pt will Ambulate: >150 feet;with modified independence;with rolling walker PT Goal: Ambulate - Progress: Progressing toward goal  Visit Information  Last PT Received On: 04/10/12 Assistance Needed: +1    Subjective Data  Subjective: Needed max encouragement for progressive ambulation Patient Stated Goal: to go back to ALF   Cognition  Overall Cognitive Status: Appears within functional limits for tasks assessed/performed Arousal/Alertness: Awake/alert Orientation Level: Appears intact for tasks assessed Behavior During Session: [facility] for tasks performed Cognition - Other Comments: not specifically tested    Balance  Balance Balance Assessed:  (no swaying. no LOB) Static Standing Balance Static Standing - Balance Support: No upper extremity supported Static Standing - Level of Assistance: 6: Modified independent (Device/Increase time) Static Standing - Comment/# of Minutes: 15 min  End of Session PT - End of Session Activity Tolerance: Patient tolerated treatment well Patient left: in chair;with call bell/phone within reach Nurse Communication: Mobility status   GP     Van Clines [facility], [place] [phone]  04/10/2012, 1:02 PM

## 2012-04-11 DIAGNOSIS — I5032 Chronic diastolic (congestive) heart failure: Secondary | ICD-10-CM

## 2012-04-11 DIAGNOSIS — I2510 Atherosclerotic heart disease of native coronary artery without angina pectoris: Secondary | ICD-10-CM

## 2012-04-11 LAB — GLUCOSE, CAPILLARY: Glucose-Capillary: 142 mg/dL — ABNORMAL HIGH (ref 70–99)

## 2012-04-11 LAB — COMPREHENSIVE METABOLIC PANEL
ALT: 14 U/L (ref 0–35)
Alkaline Phosphatase: 56 U/L (ref 39–117)
CO2: 29 mEq/L (ref 19–32)
Chloride: 103 mEq/L (ref 96–112)
GFR calc Af Amer: 89 mL/min — ABNORMAL LOW (ref >90)
GFR calc non Af Amer: 77 mL/min — ABNORMAL LOW (ref >90)
Glucose, Bld: 138 mg/dL — ABNORMAL HIGH (ref 70–99)
Potassium: 3.6 mEq/L (ref 3.5–5.1)
Sodium: 138 mEq/L (ref 135–145)
Total Bilirubin: 0.2 mg/dL — ABNORMAL LOW (ref 0.3–1.2)

## 2012-04-11 MED ORDER — POTASSIUM CHLORIDE CRYS ER 20 MEQ PO TBCR
40.0000 meq | EXTENDED_RELEASE_TABLET | Freq: Once | ORAL | Status: AC
  Administered 2012-04-11: 40 meq via ORAL
  Filled 2012-04-11: qty 2

## 2012-04-11 MED ORDER — INSULIN ASPART 100 UNIT/ML ~~LOC~~ SOLN
0.0000 [IU] | Freq: Three times a day (TID) | SUBCUTANEOUS | Status: DC
  Administered 2012-04-11: 1 [IU] via SUBCUTANEOUS

## 2012-04-11 MED ORDER — MAGNESIUM OXIDE 400 (241.3 MG) MG PO TABS
400.0000 mg | ORAL_TABLET | Freq: Two times a day (BID) | ORAL | Status: DC
  Administered 2012-04-11: 400 mg via ORAL
  Filled 2012-04-11 (×3): qty 1

## 2012-04-11 MED ORDER — PANTOPRAZOLE SODIUM 40 MG PO TBEC: 40.0000 mg | DELAYED_RELEASE_TABLET | Freq: Two times a day (BID) | ORAL | Status: DC

## 2012-04-11 MED ORDER — BOOST / RESOURCE BREEZE PO LIQD: 1.0000 | Freq: Two times a day (BID) | ORAL | Status: DC

## 2012-04-11 MED ORDER — DABIGATRAN ETEXILATE MESYLATE 150 MG PO CAPS: 150.0000 mg | ORAL_CAPSULE | Freq: Two times a day (BID) | ORAL | Status: DC

## 2012-04-11 NOTE — Progress Notes (Signed)
ATTENDING ADDENDUM:  I personally reviewed patient's record, examined the patient, and formulated the following plan:  Doing well.  Ok for d/c.  Staples out by either her facility or our RN clinic on Wed of next week.

## 2012-04-11 NOTE — Progress Notes (Signed)
Patient ID: Bianca Khan, female   DOB: 04/05/1915, 78 y.o.   MRN: [id number] 7 Days Post-Op  Subjective: Doing well, +flatus and BM, denies n/v, min abd pain with palp  Objective: Vital signs in last 24 hours: Temp:  [98 F (36.7 C)-98.8 F (37.1 C)] 98.3 F (36.8 C) (10/10 0856) Pulse Rate:  [71-86] 86  (10/10 0856) Resp:  [18] 18  (10/10 0856) BP: (139-154)/(58-75) 154/68 mmHg (10/10 0856) SpO2:  [96 %-98 %] 98 % (10/10 0856) Weight:  [93 lb 14.7 oz (42.6 kg)] 93 lb 14.7 oz (42.6 kg) (10/09 2200) Last BM Date: 04/10/12  Intake/Output from previous day: 10/09 0701 - 10/10 0700 In: 2647.8 [P.O.:720; I.V.:802.5; TPN:1125.3] Out: 1176 [Urine:1175; Stool:1] Intake/Output this shift:   Physical Exam: Lungs clear Abdomen soft, Non-distended, +BS, minimal pain around incision, staples in place Incision clean  Lab Results:   Basename 04/10/12 0500 04/09/12 0515  WBC 12.2* 13.6*  HGB 10.8* 10.6*  HCT 33.0* 32.2*  PLT 401* 395   BMET  Basename 04/11/12 0530 04/10/12 0500  NA 138 140  K 3.6 3.4*  CL 103 107  CO2 29 27  GLUCOSE 138* 149*  BUN 9 9  CREATININE 0.56 0.53  CALCIUM 8.4 8.2*   PT/INR No results found for this basename: LABPROT:2,INR:2 in the last 72 hours ABG No results found for this basename: PHART:2,PCO2:2,PO2:2,HCO3:2 in the last 72 hours  Studies/Results: No results found.  Anti-infectives: Anti-infectives     Start     Dose/Rate Route Frequency Ordered Stop   04/04/12 0600   cefOXitin (MEFOXIN) 2 g in dextrose 5 % 50 mL IVPB        2 g 100 mL/hr over 30 Minutes Intravenous 60 min pre-op 04/03/12 1419 04/04/12 1204          Assessment/Plan: s/p Procedure(s) (LRB) with comments: EXPLORATORY LAPAROTOMY (N/A) LYSIS OF ADHESION () - for SBO  1. POD#7-Exp Lap: doing well, advance diet to regular, d/c TNA, ok to discharge to facility from surgical standpoint, needs follow up with [place] early next week for staple removal.   LOS: 19 days     WHITE, ELIZABETH 04/11/2012

## 2012-04-11 NOTE — Progress Notes (Signed)
   CARE MANAGEMENT NOTE 04/11/2012  Patient:  Bianca Khan, Bianca Khan   Account Number:  [id number]  Date Initiated:  03/26/2012  Documentation initiated by:  Darlyne Russian  Subjective/Objective Assessment:   Patient admitted with abdominal pain     Action/Plan:   Progression of care and discharge planning  04/11/2012 Order for 3:1 forwarded to ALF per their request. They do not wish for CM to order this.   Anticipated DC Date:  04/11/2012   Anticipated DC Plan:    In-house referral  Clinical Social Worker         Choice offered to / List presented to:             Status of service:  Completed, signed off Medicare Important Message given?   (If response is "NO", the following Medicare IM given date [place] will be blank) Date Medicare IM given:   Date Additional Medicare IM given:    Discharge Disposition:    Per UR Regulation:  Reviewed for med. necessity/level of care/duration of stay  If discussed at Long Length of Stay Meetings, dates discussed:   04/02/2012  04/09/2012    Comments:  04/08/12- 1200- Donn Pierini RN, BSN [phone] Pt s/p exp lap for bowel obstruction- NGT out started on clears today to tx out ot 6700, from ALF - d/c plan return to ALF vs SNF pending progress.  04/02/2012  [address] RN, [place]   [phone]   03/26/2012 1200 Darlyne Russian RN,CCM  [phone] Prior to admission patient resided at [facility].

## 2012-04-11 NOTE — Clinical Social Work Note (Signed)
Patient medically stable for discharge back to [facility] after visit by staff member to assess patient's appropriateness to return directly to the ALF level of care. Patient deemed appropriate and discharge paperwork forwarded to facility and reviewed. CSW facilitated transport via ambulance.  Genelle Bal, MSW, LCSW [phone]

## 2012-04-11 NOTE — Progress Notes (Signed)
Physical Therapy Treatment Patient Details Name: Bianca Khan MRN: [id number] DOB: 01/25/1915 Today's Date: 04/11/2012 Time: 1308-6578 PT Time Calculation (min): 18 min  PT Assessment / Plan / Recommendation Comments on Treatment Session  Pt s/p SBO and exp lap with lysis of adhesions. Pt continues to need max encouragement to participate and son present today also encouraging mobility. Pt with improving mobility and eager for discharge. Will continue to follow.     Follow Up Recommendations        Does the patient have the potential to tolerate intense rehabilitation     Barriers to Discharge        Equipment Recommendations       Recommendations for Other Services    Frequency     Plan Discharge plan remains appropriate;Frequency remains appropriate    Precautions / Restrictions Precautions Precautions: Fall   Pertinent Vitals/Pain No pain    Mobility  Bed Mobility Bed Mobility: Rolling Left;Left Sidelying to Sit;Sit to Sidelying Left Rolling Left: 5: Supervision;With rail Left Sidelying to Sit: 5: Supervision;With rails Sit to Sidelying Left: 4: Min assist;HOB flat Details for Bed Mobility Assistance: cueing for sequence of side to sit and back to reduce abdominal strain with assist to elevate legs back into bed Transfers Sit to Stand: 5: Supervision;From bed Stand to Sit: 5: Supervision;To bed Details for Transfer Assistance: cueing for hand placement and safety Ambulation/Gait Ambulation/Gait Assistance: 5: Supervision Ambulation Distance (Feet): 500 Feet Assistive device: Rolling walker Ambulation/Gait Assistance Details: Pt started off gait with proper use of RW and with increased distance trying to increase speed and distance from RW with cueing for posture and position in RW Gait Pattern: Step-through pattern;Decreased stride length Stairs: No    Exercises General Exercises - Lower Extremity Long Arc Quad: AROM;Both;20 reps;Seated Hip Flexion/Marching:  AROM;Both;20 reps;Seated   PT Diagnosis:    PT Problem List:   PT Treatment Interventions:     PT Goals Acute Rehab PT Goals PT Goal: Supine/Side to Sit - Progress: Progressing toward goal PT Goal: Sit to Supine/Side - Progress: Progressing toward goal PT Goal: Sit to Stand - Progress: Progressing toward goal PT Goal: Stand to Sit - Progress: Progressing toward goal PT Goal: Ambulate - Progress: Progressing toward goal  Visit Information  Last PT Received On: 04/11/12 Assistance Needed: +1    Subjective Data  Subjective: "well, I guess I got limbered up"   Cognition  Overall Cognitive Status: Appears within functional limits for tasks assessed/performed Arousal/Alertness: Awake/alert Orientation Level: Appears intact for tasks assessed Behavior During Session: [facility] for tasks performed    Balance     End of Session PT - End of Session Activity Tolerance: Patient tolerated treatment well Patient left: in bed;with call bell/phone within reach;with family/visitor present Nurse Communication: Mobility status   GP     Toney Sang Beth 04/11/2012, 3:32 PM Delaney Meigs, PT [phone]

## 2012-04-11 NOTE — Discharge Summary (Addendum)
Physician Discharge Summary  Bianca Khan AVW:[id number] DOB: 1914/09/12 DOA: 03/23/2012  PCP: Enrique Sack, MD  Admit date: 03/23/2012 Discharge date: 04/11/2012  Recommendations for Outpatient Follow-up:  Back to ALF with PT  Discharge Diagnoses:  Principal Problem:  *Partial small bowel obstruction s/p exploratory laparotomy and lysis of adhesions.  Active Problems:  Dehydration  Hypokalemia  Chronic Leukocytosis  COPD (chronic obstructive pulmonary disease)  Hypertension  CAD (coronary artery disease) on Pradaxa pre-op  Protein calorie malnutrition  Tachycardia  Chronic Thrombocytosis  Chronic diastolic congestive heart failure, NYHA class 1  Pulmonary HTN - 41 mmHg  Diabetes mellitus   Discharge Condition: fair  Diet recommendation: cardiac  Filed Weights   04/08/12 2053 04/09/12 1926 04/10/12 2200  Weight: 44.589 kg (98 lb 4.8 oz) 42.638 kg (94 lb) 42.6 kg (93 lb 14.7 oz)    History of present illness:  78 year old female patient admitted 03/23/2012 with partial small bowel obstruction. This required NG tube decompression and patient apparently did well for 2 days after the NG tube was discontinued but unfortunately redeveloped bowel obstruction. Surgery was consulted. Patient subsequently underwent exploratory laparotomy with lysis of adhesions on 04/04/2012 and was sent to the step down unit for further monitoring and treatment post operatively he she remained through 10/7 and was transferred back to floor.   Hospital Course:  *Partial small bowel obstruction s/p surgery  - NG tube discontinued 04/07/2012. Patient based on TPN initially and now transitioned to regular diet. Patient now having bowel movement as well.  - PT recommending back to ALF with home health PT when medically ready . -Patient clinically improving and stable for discharge as per surgery. She'll follow up with surgery on 10/15 to have her staples removed.  *Blood per NG Tube  *Likely NG  induced trauma. Bleeding has stopped and NG tube was removed  *Hb stable  -Continue PPI   Dehydration/Hypokalemia  *Resolved,   Tachycardia  *Resolved, *was Likely related to dehydration but could be related to pain  *On scheduled low-dose Lopressor every 8 hours IV  - resume Cardizem CD on discharge  Paroxysmal A fib:  patient on pradaxa as outpatient. It was discontinued on recent discharge as given her age and body weight with risk for bleeding she was not a candidate for it and switched to aspirin 325 mg by mouth daily  Diabetes mellitus type 2, controlled  *On metformin prior to admission, *CBGs in normal range . Resume metformin 500 mg by mouth daily *  Hypertension  *BP stable. Resume outpatient meds.  CAD (coronary artery disease) *Currently no clinical signs of ischemia and patient denies chest pain     Chronic diastolic congestive heart failure, NYHA class 1 (ECHO 2008)  *Currently compensated, euvolemic-  Chronic Leukocytosis/Chronic Thrombocytosis  *Has been evaluated by hematology in the past - abnormalities felt to be reactive in nature  *improving, Suspect has acute reactive component do to acute surgical procedure  -Improving now  COPD (chronic obstructive pulmonary disease)/Pulmonary HTN - 41 mmHg  *Compensated  *Was on Spiriva the and Mucinex prior to admission  *Continue nebulizers  *Chest x-ray 10/7 with no acute infiltrates  *continue flutter valve   Code Status: LIMITED CODE -per Dr. Phillips Odor who discussed advanced planning and code status in detail, "I do not want a prolonged or painful death" She has a living will. She was asked permission to talk to her sons but she said that she "knows what she wants". This status of course does not  preclude her from aggressive treatment of reversible conditions or full scope medical care. This has been designated LIMITED as an appropriate status since she may need medications for BP and dysrythmias.   Family  Communication: Updated plan the patient and her son at bedside   Disposition Plan: ALF with PT   Consultants:  General surgery   Procedures:  Exploratory laparotomy with lysis of adhesions and release of small bowel traction in setting of recurrent small bowel obstruction on 04/04/2012   Antibiotics:  None        Discharge Exam: Filed Vitals:   04/10/12 2200 04/11/12 0622 04/11/12 0856 04/11/12 0938  BP: 139/75 146/58 154/68   Pulse: 85 71 86   Temp: 98.8 F (37.1 C) 98.5 F (36.9 C) 98.3 F (36.8 C)   TempSrc: Oral Oral Oral   Resp: 18 18 18    Height:      Weight: 42.6 kg (93 lb 14.7 oz)     SpO2: 98% 97% 98% 95%    General: Elderly thin built female lying in bed in no acute distress  HEENT: No pallor, moist oral mucosa  Cardiovascular: Normal S1 and S2 no murmurs rub or gallop  Respiratory: Clear breath sounds bilaterally  Abdomen: Soft , surgical staples intact, nondistended, nontender, bowel sounds present  Extremities: Warm, no edema  CNS: AAO x3   Discharge Instructions  Discharge Orders    Future Appointments: Provider: Department: Dept Phone: Center:   04/16/2012 10:30 AM [facility] [phone] None     Future Orders Please Complete By Expires   Diet - low sodium heart healthy      Increase activity slowly      Discharge instructions      Comments:   Soft bland diet for next 1-2 days then advance as tolerated       Medication List     As of 04/11/2012 10:42 AM    STOP taking these medications         dabigatran 150 MG Caps   Commonly known as: PRADAXA      doxycycline 100 MG capsule   Commonly known as: VIBRAMYCIN      methocarbamol 500 MG tablet   Commonly known as: ROBAXIN      TAKE these medications         acetaminophen 325 MG tablet   Commonly known as: TYLENOL   Take 650 mg by mouth every 4 (four) hours as needed. Headache or pain      albuterol 108 (90 BASE) MCG/ACT inhaler   Commonly known as:  PROVENTIL HFA;VENTOLIN HFA   Inhale 2 puffs into the lungs every 6 (six) hours as needed. For wheezing cough shortness of breath  Use with adapter      alendronate 70 MG tablet   Commonly known as: FOSAMAX   Take 70 mg by mouth every 7 (seven) days. Take with a full glass of water on an empty stomach.  Usually on sunday      aspirin 325 MG tablet   Take 1 tablet (325 mg total) by mouth daily.      barrier cream Crea   Commonly known as: non-specified   Apply 1 application topically 2 (two) times daily as needed. Moister barrier cream   Apply to buttock as needed for redness      diclofenac sodium 1 % Gel   Commonly known as: VOLTAREN   Apply 2 g topically every 8 (eight) hours as needed. For joint  pain      diltiazem 240 MG 24 hr capsule   Commonly known as: CARDIZEM CD   Take 240 mg by mouth daily.      ergocalciferol 50000 UNITS capsule   Commonly known as: VITAMIN D2   Take 50,000 Units by mouth once a week. Usually on Sunday      guaiFENesin 600 MG 12 hr tablet   Commonly known as: MUCINEX   Take 2 tablets (1,200 mg total) by mouth 2 (two) times daily as needed for congestion. Cough and congestion      hydrochlorothiazide 25 MG tablet   Commonly known as: HYDRODIURIL   Take 25 mg by mouth daily.      menthol-cetylpyridinium 3 MG lozenge   Commonly known as: CEPACOL   Take 1 lozenge by mouth every 4 (four) hours as needed. For sore throat      metFORMIN 500 MG tablet   Commonly known as: GLUCOPHAGE   Take 1 tablet (500 mg total) by mouth daily with breakfast.      mirtazapine 15 MG tablet   Commonly known as: REMERON   Take 15 mg by mouth at bedtime.      omeprazole 20 MG capsule   Commonly known as: PRILOSEC   Take 20 mg by mouth daily.      ondansetron 4 MG tablet   Commonly known as: ZOFRAN   Take 4 mg by mouth every 8 (eight) hours as needed. nausea      PHENASEPTIC 1.4 % Liqd   Generic drug: phenol   Use as directed 1 spray in the mouth or throat  as needed. Sore throat      polyethylene glycol packet   Commonly known as: MIRALAX / GLYCOLAX   Take 17 g by mouth daily as needed. constipation      ProCel Powd   Take by mouth 2 (two) times daily.      protein supplement Powd   Take 1 scoop by mouth 2 (two) times daily with a meal.      senna-docusate 8.6-50 MG per tablet   Commonly known as: Senokot-S   Take 2 tablets by mouth daily as needed. For constipation      tiotropium 18 MCG inhalation capsule   Commonly known as: SPIRIVA   Place 18 mcg into inhaler and inhale daily.      VIACTIV PO   Take 1 each by mouth 2 (two) times daily.      protein supplement (RESOURCE BENEPROTEIN) POWD  Take 1 scoop by mouth 2 (two) times daily with a meal.     Follow-up Information    Follow up with GREEN, EDWIN JAY, MD. In 1 week.   Contact information:   [address] [place] [address] [phone]       Follow up with [facility] On 04/16/2012. (at 10:30.  Please arrive 15 minutes early)    Contact information:   [address] [place] [address] [phone]          The results of significant diagnostics from this hospitalization (including imaging, microbiology, ancillary and laboratory) are listed below for reference.    Significant Diagnostic Studies: Dg Abd 1 View  04/02/2012  *RADIOLOGY REPORT*  Clinical Data: Partial small bowel obstruction  ABDOMEN - 1 VIEW  Comparison: 03/30/2012  Findings: Interval worsening gaseous distention of small bowel measuring 3.8 cm in diameter in the left abdomen compatible with a recurrent small  bowel obstruction pattern compared to 03/30/2012. Stable chronic calcifications of the pancreatic head and uncinate process better demonstrated by CT.  Postop changes of both hips. Degenerative changes of the spine.  Lung bases remain clear.  IMPRESSION: Worsening small bowel dilatation compatible with recurring small bowel  obstruction.   Original Report Authenticated By: Judie Petit. Ruel Favors, M.D.    Dg Abd 1 View  03/29/2012  *RADIOLOGY REPORT*  Clinical Data: History of partial small-bowel obstruction.  Follow- up.  ABDOMEN - 1 VIEW  Comparison: 03/28/2012.  CT 03/23/2012.  Findings: On the supine image multiple dilated loops of small intestine are identified.  The largest has a caliber approximately 4 cm which is similar to the largest on the prior study.  There is a paucity of colonic gas.  There is a small amount of rectal gas. No opaque calculi are seen.  Pelvic phleboliths are present. Previous hip ORIF has been performed with hardware in the proximal aspect of each femur without evidence of disruption.  IMPRESSION: Continued abnormal small bowel gas pattern with dilatation on supine image.  A paucity of colonic gas present. Air-fluid levels cannot be evaluated without erect or decubitus examination. Pattern is consistent on supine image with distal partial small- bowel obstruction without significant change.   Original Report Authenticated By: Crawford Givens, M.D.    Dg Abd 1 View  03/28/2012  *RADIOLOGY REPORT*  Clinical Data: Partial small bowel obstruction.  ABDOMEN - 1 VIEW  Comparison: 03/27/2012  Findings: Multiple moderately dilated small bowel loops are seen throughout the abdomen and pelvis without significant change. There remains a paucity of colonic gas, and this is consistent with a distal small bowel obstruction.  IMPRESSION: Bowel gas pattern consistent with distal small bowel obstruction, without significant change since prior.   Original Report Authenticated By: Danae Orleans, M.D.    Dg Abd 1 View  03/25/2012  *RADIOLOGY REPORT*  Clinical Data: Abdominal tenderness  ABDOMEN - 1 VIEW  Comparison: 03/23/2012 CT  Findings: NG tube tip projects over the descending portion of the duodenum.  Bowel gas pattern nonobstructive.  Calcific densities project over the pancreatic head.  Lung bases are clear.  Surgical  hardware bilateral hips.  No acute osseous finding.  IMPRESSION: Bowel gas pattern nonobstructive.  NG tube tip projects over the duodenum.   Original Report Authenticated By: Waneta Martins, M.D.    Ct Abdomen Pelvis W Contrast  03/23/2012  *RADIOLOGY REPORT*  Clinical Data: Acute nausea and vomiting.  CT ABDOMEN AND PELVIS WITH CONTRAST  Technique:  Multidetector CT imaging of the abdomen and pelvis was performed following the standard protocol during bolus administration of intravenous contrast.  Contrast: 64mL OMNIPAQUE IOHEXOL 300 MG/ML  SOLN  Comparison: None  Findings: The lung bases are clear.  No pericardial or pleural effusion.  There is no focal liver abnormality.  The gallbladder appears normal.  Calcifications within the head and uncinate process of the pancreas identified.  The body and tail of pancreas appear normal.  The spleen is normal.  The adrenal glands are both unremarkable.  Small hypodensities within the inferior pole of the right kidney noted. Too small to characterize.  The left kidney is unremarkable. No evidence for obstructive uropathy.  The urinary bladder appears within normal limits.  Previous hysterectomy.  No enlarged upper abdominal lymph nodes.  No pelvic or inguinal adenopathy.  There is no free fluid or fluid collections within the upper abdomen.  No pelvic free fluid or pelvic fluid collections identified.  Large hiatal hernia is noted.  The mid small bowel loops are increased in caliber and contain multiple fluid levels.  These measure up to 2.5 cm.  The colon has a normal caliber.  There are multiple colonic diverticula without evidence for diverticulitis.  The appendix is not visualize separate from the right lower quadrant bowel loops. Review of the visualized osseous structures is significant for degenerative disc disease at the L5-S1 level.  The patient is status post bilateral hardware fixation of the proximal femurs.  IMPRESSION:  1.  Increased caliber of the  small bowel loops which measure up to 2.5 cm.  Findings may reflect partial obstruction or small bowel ileus. 2.  No perforation or evidence of abscess formation.   Original Report Authenticated By: Rosealee Albee, M.D.    Ct Entero Abd/pelvis W/cm  04/03/2012  *RADIOLOGY REPORT*  Clinical Data:  Persistent small bowel obstruction.  Nausea.  Pain.  CT ABDOMEN AND PELVIS WITH CONTRAST (CT ENTEROGRAPHY)  Technique:  Multidetector CT of the abdomen and pelvis during bolus administration of intravenous contrast. Negative oral contrast VoLumen was given.  Contrast: 80mL OMNIPAQUE IOHEXOL 300 MG/ML  SOLN  Comparison:  CT from 03/23/2012  Findings: Images which include the lower chest show a small right pleural effusion.  Moderate hiatal hernia is evident.  NG tube tip is in the proximal stomach, tenting the lateral wall of the stomach.  There is no evidence for inner wall or transmural hyperenhancement in the stomach.  The pylorus and duodenal bulb have normal imaging features.  The duodenum is not dilated.  A small bowel dilatation begins just distal to the ligament of Treitz. Small bowel loops are diffusely fluid-filled and dilated up to 3.4 cm in diameter in the pelvis and 3.7 cm in diameter in the abdomen.  An abrupt transition zone is identified in the posterior right pelvis (see image 62 of series 2).  There is some subtly increased transmural enhancement in this region which may be related to the collapsed bowel has imaging features are not entirely suggestive of an inflammatory stricture.  Distal to this transition zone, there is a loop of small bowel in the cul-de-sac which is dilated up to about 2.9 cm.  This appears to leading to a second transition zone involving the distal ileum, about 15-25 cm proximal to the ileocecal valve.  The distal ileum is completely decompressed.  The colon is decompressed throughout.  No focal abnormalities seen in the liver.  There is some mild periportal edema.  Spleen is  unremarkable.  There appear to be years some dystrophic calcification in the uncinate process and pancreatic head.  Adrenal glands are unremarkable.  There is cortical scarring in both kidneys.  No abdominal aortic aneurysm.  The celiac axis and superior mesenteric artery opacify as does the inferior mesenteric artery. The superior mesenteric vein and portal vein are patent.  Imaging through the pelvis shows a small amount of free fluid in the cul-de-sac.  Bladder is unremarkable.  Uterus is surgically absent.  There is no adnexal mass.  The terminal ileum is decompressed.  The appendix is normal.  The patient is status post pin placement in the left femoral neck and plate and screw fixation of the right femoral neck.  Bones are diffusely demineralized.  IMPRESSION:  Imaging features consistent with distal small bowel obstruction. There is abrupt transition zone in the posterior lower right pelvis.  This is a relatively short transition zone before extending into another fluid filled dilated  short segment of small bowel which leads into a second transition point.  The distal and terminal ileum beyond the second transition zone are completely collapsed as is the colon.  The presence of two transition zones raises the possibility of closed loop obstruction.  There is a small amount of free fluid in cul-de-sac.   Original Report Authenticated By: ERIC A. MANSELL, M.D.    Dg Chest Port 1 View  04/03/2012  *RADIOLOGY REPORT*  Clinical Data: Preoperative respiratory evaluation for colon surgery  PORTABLE CHEST - 1 VIEW  Comparison: 03/23/2012  Findings: 1906 hours.  Hyperexpansion is consistent with emphysema. The cardiopericardial silhouette is enlarged. The NG tube passes into the stomach although the distal tip position is not included on the film. The lungs are clear without focal infiltrate, edema, pneumothorax or pleural effusion.  Old posterior right rib fractures noted.  Bones are diffusely demineralized.   IMPRESSION: Emphysema with interstitial coarsening.  No acute cardiopulmonary process.   Original Report Authenticated By: ERIC A. MANSELL, M.D.    Dg Chest Port 1 View  03/23/2012  *RADIOLOGY REPORT*  Clinical Data: COPD, CHF  PORTABLE CHEST - 1 VIEW  Comparison: 07/16/2010  Findings: The heart size is normal.  No pleural effusion or edema. The lungs appear hyperinflated but clear.  No airspace consolidation.  Right posterior seventh rib fracture deformity is noted.  IMPRESSION:  1.  No acute cardiopulmonary abnormalities.   Original Report Authenticated By: Rosealee Albee, M.D.    Dg Abd 2 Views  03/30/2012  *RADIOLOGY REPORT*  Clinical Data: Partial small bowel obstruction.  ABDOMEN - 2 VIEW  Comparison: 1 day prior and CT of 03/23/2012.  Findings: 2 supine and one right-side up decubitus view.  The right- sided decubitus view demonstrates no free intraperitoneal air. There are numerous air-fluid levels within small bowel loops within the right lower quadrant.  Supine images demonstrate improving small bowel dilatation. Calcifications in the right side of the abdomen are vascular or due to old granulomatous disease in nodes, when correlated with prior CT.  No pneumatosis. Distal gas and stool.  Bilateral proximal femoral fixation.  Mild osteopenia.  IMPRESSION: Improvement in the small bowel obstruction pattern.  Persistent small bowel air fluid levels, without residual small bowel dilatation.   Original Report Authenticated By: Consuello Bossier, M.D.    Dg Abd Acute W/chest  04/08/2012  *RADIOLOGY REPORT*  Clinical Data: abd soreness, ? early PNA on right-follow post op ileus  ACUTE ABDOMEN SERIES (ABDOMEN 2 VIEW & CHEST 1 VIEW)  Comparison: Multiple exams, including 04/05/2012  Findings: Right PICC line tip projects over lower SVC.  Moderate sized hiatal hernia.  Blunted costophrenic angles suggesting small bilateral pleural effusions.  Right rib deformities related to old fractures.  Bibasilar nodular  densities are symmetric and compatible with nipple shadows. The patient is rotated to the left on today's exam, resulting in reduced diagnostic sensitivity and specificity.  Attenuated peripheral pulmonary vasculature is compatible with emphysema/COPD.  Heart size within normal limits.  Midline skin clips noted.  The borderline dilated loop of left abdominal small bowel contains air fluid levels.  Air-fluid levels are present in the descending colon and in a loop of right-sided bowel which may represent cecum or less likely dilated small bowel.  Dynamic hip screw noted on the right and cannulated screws are present in the left hip.  Prior nasogastric tube has been removed.  IMPRESSION:  1.  Scattered air-fluid levels in small and large bowel favoring ileus.  There is a borderline dilated loop of small bowel in the left abdomen. 2.  Suspected emphysema. 3.  Small bilateral pleural effusions. 4.  Moderate sized hiatal hernia.   Original Report Authenticated By: Dellia Cloud, M.D.    Dg Abd Portable 1v  04/05/2012  *RADIOLOGY REPORT*  Clinical Data: Evaluate nasogastric tube placement.  PORTABLE ABDOMEN - 1 VIEW  Comparison: 04/02/2012  Findings: The nasogastric tube is in the stomach body region.  The side hole of the nasogastric tube is near the GE junction.  The degree of small bowel distention has markedly decreased.  There continues to be some dilated loops of bowel in the left upper quadrant.   Patient has undergone abdominal surgery and skin staples are present.  Postoperative changes in both hips. Difficult to evaluate for free air on this examination.  Lucency in the right upper abdomen could be related to recent surgery.  IMPRESSION: Nasogastric tube in the stomach body region.  Nonspecific bowel gas pattern.  Few dilated loops of bowel in the left upper quadrant.   Original Report Authenticated By: Richarda Overlie, M.D.    Dg Abd Portable 1v  03/27/2012  *RADIOLOGY REPORT*  Clinical Data: Follow up  small bowel obstruction  PORTABLE ABDOMEN - 1 VIEW  Comparison: 03/25/2012  Findings: Again noted are dilated loops of small bowel.  This is not significantly changed when compared with previous exam.  The nasogastric tube has been removed.  IMPRESSION:  1.  No change in small bowel obstruction pattern.   Original Report Authenticated By: Rosealee Albee, M.D.     Microbiology: Recent Results (from the past 240 hour(s))  SURGICAL PCR SCREEN     Status: Normal   Collection Time   04/03/12  6:55 PM      Component Value Range Status Comment   MRSA, PCR NEGATIVE  NEGATIVE Final    Staphylococcus aureus NEGATIVE  NEGATIVE Final      Labs: Basic Metabolic Panel:  Lab 04/11/12 1610 04/10/12 0500 04/09/12 0515 04/08/12 0538 04/07/12 0501 04/06/12 0355  NA 138 140 141 138 138 --  K 3.6 3.4* 3.7 3.5 3.1* --  CL 103 107 108 105 104 --  CO2 29 27 27 28 27  --  GLUCOSE 138* 149* 101* 92 131* --  BUN 9 9 9 10 10  --  CREATININE 0.56 0.53 0.49* 0.46* 0.49* --  CALCIUM 8.4 8.2* 7.8* 7.4* 7.0* --  MG 1.4* -- 1.8 1.5 1.8 1.1*  PHOS 3.3 -- 2.5 2.4 2.2* 1.6*   Liver Function Tests:  Lab 04/11/12 0530 04/08/12 0538 04/06/12 0355  AST 20 12 14   ALT 14 7 7   ALKPHOS 56 53 68  BILITOT 0.2* 0.4 0.4  PROT 4.8* 4.4* 4.7*  ALBUMIN 2.2* 1.9* 2.2*   No results found for this basename: LIPASE:5,AMYLASE:5 in the last 168 hours No results found for this basename: AMMONIA:5 in the last 168 hours CBC:  Lab 04/10/12 0500 04/09/12 0515 04/07/12 0501 04/06/12 0355 04/05/12 1342  WBC 12.2* 13.6* 16.3* 20.9* 29.2*  NEUTROABS -- -- -- -- --  HGB 10.8* 10.6* 11.3* 12.1 13.6  HCT 33.0* 32.2* 33.2* 35.7* 40.7  MCV 90.7 90.4 89.7 90.2 91.5  PLT 401* 395 430* 477* 452*   Cardiac Enzymes: No results found for this basename: CKTOTAL:5,CKMB:5,CKMBINDEX:5,TROPONINI:5 in the last 168 hours BNP: BNP (last 3 results) No results found for this basename: PROBNP:3 in the last 8760 hours CBG:  Lab 04/11/12 0623  04/11/12 0012 04/10/12 1719 04/10/12  1139 04/10/12 0731  GLUCAP 142* 131* 129* 129* 133*    Time coordinating discharge: 40 minutes  Signed:  Cooper Moroney  [facility] 04/11/2012, 10:42 AM

## 2012-04-11 NOTE — Progress Notes (Signed)
PARENTERAL NUTRITION CONSULT NOTE - Follow Up  Pharmacy Consult: TPN Indication: PCM  No Known Allergies  Patient Measurements: Height: 5' 3.5" (161.3 cm) Weight: 93 lb 14.7 oz (42.6 kg) IBW/kg (Calculated) : 53.55  Adjusted Body Weight: 33 kg Usual Weight: 33 kg  Vital Signs: Temp: 98.3 F (36.8 C) (10/10 0856) Temp src: Oral (10/10 0856) BP: 154/68 mmHg (10/10 0856) Pulse Rate: 86  (10/10 0856) Intake/Output from previous day: 10/09 0701 - 10/10 0700 In: 2647.8 [P.O.:720; I.V.:802.5; TPN:1125.3] Out: 1176 [Urine:1175; Stool:1]  Labs:  [facility] 04/10/12 0500 04/09/12 0515  WBC 12.2* 13.6*  HGB 10.8* 10.6*  HCT 33.0* 32.2*  PLT 401* 395  APTT -- --  INR -- --     Basename 04/11/12 0530 04/10/12 0500 04/09/12 0515  NA 138 140 141  K 3.6 3.4* 3.7  CL 103 107 108  CO2 29 27 27   GLUCOSE 138* 149* 101*  BUN 9 9 9   CREATININE 0.56 0.53 0.49*  LABCREA -- -- --  CREAT24HRUR -- -- --  CALCIUM 8.4 8.2* 7.8*  MG 1.4* -- 1.8  PHOS 3.3 -- 2.5  PROT 4.8* -- --  ALBUMIN 2.2* -- --  AST 20 -- --  ALT 14 -- --  ALKPHOS 56 -- --  BILITOT 0.2* -- --  BILIDIR -- -- --  IBILI -- -- --  PREALBUMIN -- -- --  TRIG -- -- --  CHOLHDL -- -- --  CHOL -- -- --   Estimated Creatinine Clearance: 28.3 ml/min (by C-G formula based on Cr of 0.56).    Basename 04/11/12 0623 04/11/12 0012 04/10/12 1719  GLUCAP 142* 131* 129*    Medical History: Past Medical History  Diagnosis Date  . COPD (chronic obstructive pulmonary disease)   . CHF (congestive heart failure)   . Edema   . Myocardial infarction   . Anxiety   . Hypertension   . Anemia   . Leukocytosis   . Acid reflux       Insulin Requirements in the past 24 hours:  4 units SSI + 13 units in TPN  Current Nutrition:  Full liquid diet + TPN   Assessment:  78 y/o (only 33 kg) assisted living resident with recurrent small bowel obstruction secondary to adhesions and internal hernia. S/p ex-lap with LOA 04/04/12  and patient continues on TPN for nutritional support.  Diet advanced to full liquid.  Patient reports eating at least 50% of her meals and denies nausea, vomiting, and abdominal pain.  GI: s/p LOA for SBO, post-op ileus, hx GERD on IV PPI.  NGT d/c'ed 10/6, +flatus, +BM Anticoag: had bloody NG O/P and Lovenox d/c'ed.  H/H/plts ok Lytes: K+ 3.6 (goal K+ ~4 for ileus), hypomagnesemia, others WNL Hepatobiliary: LFTs / tbili / TG WNL ID: Afebrile. WBC trending down - not on abx Endo: CBGs adequately controlled, trending up with PO intake Renal: SCr stable, CrCL 28 ml/min,  NS at 50 ml/hr, excellent UOP Pulm: hx COPD on Spiriva, stable on RA Cards: hx HTN / Afib (on Pradaxa PTA) - BP trending up, HR stable, on IV metoprolol Neuro: A&O Best Practices: SCDs, MC, Lovenox, Zantac  Nutritional needs:  [id number] kcal, 30-40g protein, ~1.2 L/day    Plan:  - D/C TPN post this bag per discussion with PA from Surgery - Instructed RN to stop at 1800 without the need to taper since the current rate is low at 25 ml/hr - Continue daily PO multivitamin - Magnesium oxide 400mg  PO BID  x 2 doses - KCL PO x 1 - Change SSI / CBG checks to ACHS - Change Protonix to PO - MD, consider d/c either Protonix or ranitidine d/t duplication in therapy. - Pharmacy will sign off.  Thank you for the consult!     Iola Turri D. Laney Potash, PharmD, BCPS Pager:  [phone] 04/11/2012, 9:13 AM

## 2012-04-11 NOTE — Progress Notes (Signed)
Patient was discharged by ambulance to [facility]. Patient was educated on care of her incision and medications. Patient was in stable condition upon discharge. Patients son was present during discharge instructions. PICC line was taken out before discharge to facility. Report was called to [facility] prior to patient leaving the floor.

## 2012-04-16 ENCOUNTER — Encounter (INDEPENDENT_AMBULATORY_CARE_PROVIDER_SITE_OTHER): Payer: Medicare Other

## 2012-04-16 ENCOUNTER — Ambulatory Visit (INDEPENDENT_AMBULATORY_CARE_PROVIDER_SITE_OTHER): Payer: Medicare Other | Admitting: General Surgery

## 2012-04-16 ENCOUNTER — Encounter (INDEPENDENT_AMBULATORY_CARE_PROVIDER_SITE_OTHER): Payer: Self-pay | Admitting: General Surgery

## 2012-04-16 VITALS — BP 126/64 | HR 68 | Temp 96.4°F | Resp 14 | Ht 63.5 in | Wt 83.1 lb

## 2012-04-16 DIAGNOSIS — Z4802 Encounter for removal of sutures: Secondary | ICD-10-CM | POA: Diagnosis not present

## 2012-04-16 NOTE — Patient Instructions (Addendum)
Your wound appears to have healed well. The staples were removed without complication. Be sure to bathe with antibacterial soap as instructed. Do not apply any lotions or creams to the area until you have been seen post op and approved by the doctor to do so at that time. When you shower allow the soapy water to run over the area and then pat dry. Allow the water to soften any scabs that remain on the skin to fall off on their own. Please make sure to keep your post op appointment for wound assessment.

## 2012-04-16 NOTE — Progress Notes (Signed)
Patient presents today in clinic for suture removal. Patient had 15 staples to be removed. The area appeared well healed, well approximated. All staples were removed without complication or resistance. The patient tolerated the procedure well. The area did not show any sign of infection, redness or discharge. The patient did not have a fever and the skin at the incision site did not show any signs of rash of bumps. Patient and daughter-in-law were instructed on wound care. Both advised to look out for signs of infection and to contact our office if they arise.

## 2012-04-19 DIAGNOSIS — I2589 Other forms of chronic ischemic heart disease: Secondary | ICD-10-CM | POA: Diagnosis not present

## 2012-04-19 DIAGNOSIS — I509 Heart failure, unspecified: Secondary | ICD-10-CM | POA: Diagnosis not present

## 2012-04-19 DIAGNOSIS — I2510 Atherosclerotic heart disease of native coronary artery without angina pectoris: Secondary | ICD-10-CM | POA: Diagnosis not present

## 2012-04-19 DIAGNOSIS — J449 Chronic obstructive pulmonary disease, unspecified: Secondary | ICD-10-CM | POA: Diagnosis not present

## 2012-04-19 DIAGNOSIS — R262 Difficulty in walking, not elsewhere classified: Secondary | ICD-10-CM | POA: Diagnosis not present

## 2012-04-19 DIAGNOSIS — R41840 Attention and concentration deficit: Secondary | ICD-10-CM | POA: Diagnosis not present

## 2012-04-19 DIAGNOSIS — K565 Intestinal adhesions [bands], unspecified as to partial versus complete obstruction: Secondary | ICD-10-CM | POA: Diagnosis not present

## 2012-04-19 DIAGNOSIS — M6281 Muscle weakness (generalized): Secondary | ICD-10-CM | POA: Diagnosis not present

## 2012-04-19 DIAGNOSIS — I10 Essential (primary) hypertension: Secondary | ICD-10-CM | POA: Diagnosis not present

## 2012-04-22 DIAGNOSIS — M6281 Muscle weakness (generalized): Secondary | ICD-10-CM | POA: Diagnosis not present

## 2012-04-22 DIAGNOSIS — I2589 Other forms of chronic ischemic heart disease: Secondary | ICD-10-CM | POA: Diagnosis not present

## 2012-04-22 DIAGNOSIS — K565 Intestinal adhesions [bands], unspecified as to partial versus complete obstruction: Secondary | ICD-10-CM | POA: Diagnosis not present

## 2012-04-22 DIAGNOSIS — R262 Difficulty in walking, not elsewhere classified: Secondary | ICD-10-CM | POA: Diagnosis not present

## 2012-04-22 DIAGNOSIS — R41840 Attention and concentration deficit: Secondary | ICD-10-CM | POA: Diagnosis not present

## 2012-04-22 DIAGNOSIS — I2510 Atherosclerotic heart disease of native coronary artery without angina pectoris: Secondary | ICD-10-CM | POA: Diagnosis not present

## 2012-04-23 DIAGNOSIS — I2589 Other forms of chronic ischemic heart disease: Secondary | ICD-10-CM | POA: Diagnosis not present

## 2012-04-23 DIAGNOSIS — I2510 Atherosclerotic heart disease of native coronary artery without angina pectoris: Secondary | ICD-10-CM | POA: Diagnosis not present

## 2012-04-23 DIAGNOSIS — M6281 Muscle weakness (generalized): Secondary | ICD-10-CM | POA: Diagnosis not present

## 2012-04-23 DIAGNOSIS — K565 Intestinal adhesions [bands], unspecified as to partial versus complete obstruction: Secondary | ICD-10-CM | POA: Diagnosis not present

## 2012-04-23 DIAGNOSIS — R262 Difficulty in walking, not elsewhere classified: Secondary | ICD-10-CM | POA: Diagnosis not present

## 2012-04-23 DIAGNOSIS — R41840 Attention and concentration deficit: Secondary | ICD-10-CM | POA: Diagnosis not present

## 2012-04-24 DIAGNOSIS — I2589 Other forms of chronic ischemic heart disease: Secondary | ICD-10-CM | POA: Diagnosis not present

## 2012-04-24 DIAGNOSIS — R41840 Attention and concentration deficit: Secondary | ICD-10-CM | POA: Diagnosis not present

## 2012-04-24 DIAGNOSIS — I2510 Atherosclerotic heart disease of native coronary artery without angina pectoris: Secondary | ICD-10-CM | POA: Diagnosis not present

## 2012-04-24 DIAGNOSIS — K565 Intestinal adhesions [bands], unspecified as to partial versus complete obstruction: Secondary | ICD-10-CM | POA: Diagnosis not present

## 2012-04-24 DIAGNOSIS — M6281 Muscle weakness (generalized): Secondary | ICD-10-CM | POA: Diagnosis not present

## 2012-04-24 DIAGNOSIS — R262 Difficulty in walking, not elsewhere classified: Secondary | ICD-10-CM | POA: Diagnosis not present

## 2012-04-25 DIAGNOSIS — K56609 Unspecified intestinal obstruction, unspecified as to partial versus complete obstruction: Secondary | ICD-10-CM | POA: Diagnosis not present

## 2012-04-25 DIAGNOSIS — R634 Abnormal weight loss: Secondary | ICD-10-CM | POA: Diagnosis not present

## 2012-04-25 DIAGNOSIS — K5900 Constipation, unspecified: Secondary | ICD-10-CM | POA: Diagnosis not present

## 2012-04-26 DIAGNOSIS — I2589 Other forms of chronic ischemic heart disease: Secondary | ICD-10-CM | POA: Diagnosis not present

## 2012-04-26 DIAGNOSIS — R41840 Attention and concentration deficit: Secondary | ICD-10-CM | POA: Diagnosis not present

## 2012-04-26 DIAGNOSIS — M6281 Muscle weakness (generalized): Secondary | ICD-10-CM | POA: Diagnosis not present

## 2012-04-26 DIAGNOSIS — R262 Difficulty in walking, not elsewhere classified: Secondary | ICD-10-CM | POA: Diagnosis not present

## 2012-04-26 DIAGNOSIS — K565 Intestinal adhesions [bands], unspecified as to partial versus complete obstruction: Secondary | ICD-10-CM | POA: Diagnosis not present

## 2012-04-26 DIAGNOSIS — I2510 Atherosclerotic heart disease of native coronary artery without angina pectoris: Secondary | ICD-10-CM | POA: Diagnosis not present

## 2012-04-30 DIAGNOSIS — R41840 Attention and concentration deficit: Secondary | ICD-10-CM | POA: Diagnosis not present

## 2012-04-30 DIAGNOSIS — R262 Difficulty in walking, not elsewhere classified: Secondary | ICD-10-CM | POA: Diagnosis not present

## 2012-04-30 DIAGNOSIS — I2589 Other forms of chronic ischemic heart disease: Secondary | ICD-10-CM | POA: Diagnosis not present

## 2012-04-30 DIAGNOSIS — M6281 Muscle weakness (generalized): Secondary | ICD-10-CM | POA: Diagnosis not present

## 2012-04-30 DIAGNOSIS — K565 Intestinal adhesions [bands], unspecified as to partial versus complete obstruction: Secondary | ICD-10-CM | POA: Diagnosis not present

## 2012-04-30 DIAGNOSIS — I2510 Atherosclerotic heart disease of native coronary artery without angina pectoris: Secondary | ICD-10-CM | POA: Diagnosis not present

## 2012-05-01 DIAGNOSIS — M6281 Muscle weakness (generalized): Secondary | ICD-10-CM | POA: Diagnosis not present

## 2012-05-01 DIAGNOSIS — I2510 Atherosclerotic heart disease of native coronary artery without angina pectoris: Secondary | ICD-10-CM | POA: Diagnosis not present

## 2012-05-01 DIAGNOSIS — R41840 Attention and concentration deficit: Secondary | ICD-10-CM | POA: Diagnosis not present

## 2012-05-01 DIAGNOSIS — I2589 Other forms of chronic ischemic heart disease: Secondary | ICD-10-CM | POA: Diagnosis not present

## 2012-05-01 DIAGNOSIS — K565 Intestinal adhesions [bands], unspecified as to partial versus complete obstruction: Secondary | ICD-10-CM | POA: Diagnosis not present

## 2012-05-01 DIAGNOSIS — R262 Difficulty in walking, not elsewhere classified: Secondary | ICD-10-CM | POA: Diagnosis not present

## 2012-05-02 DIAGNOSIS — I10 Essential (primary) hypertension: Secondary | ICD-10-CM | POA: Diagnosis not present

## 2012-05-02 DIAGNOSIS — M6281 Muscle weakness (generalized): Secondary | ICD-10-CM | POA: Diagnosis not present

## 2012-05-02 DIAGNOSIS — K565 Intestinal adhesions [bands], unspecified as to partial versus complete obstruction: Secondary | ICD-10-CM | POA: Diagnosis not present

## 2012-05-02 DIAGNOSIS — E119 Type 2 diabetes mellitus without complications: Secondary | ICD-10-CM | POA: Diagnosis not present

## 2012-05-02 DIAGNOSIS — R262 Difficulty in walking, not elsewhere classified: Secondary | ICD-10-CM | POA: Diagnosis not present

## 2012-05-02 DIAGNOSIS — I2510 Atherosclerotic heart disease of native coronary artery without angina pectoris: Secondary | ICD-10-CM | POA: Diagnosis not present

## 2012-05-02 DIAGNOSIS — R41840 Attention and concentration deficit: Secondary | ICD-10-CM | POA: Diagnosis not present

## 2012-05-02 DIAGNOSIS — D62 Acute posthemorrhagic anemia: Secondary | ICD-10-CM | POA: Diagnosis not present

## 2012-05-02 DIAGNOSIS — I2589 Other forms of chronic ischemic heart disease: Secondary | ICD-10-CM | POA: Diagnosis not present

## 2012-05-03 DIAGNOSIS — R41840 Attention and concentration deficit: Secondary | ICD-10-CM | POA: Diagnosis not present

## 2012-05-03 DIAGNOSIS — K565 Intestinal adhesions [bands], unspecified as to partial versus complete obstruction: Secondary | ICD-10-CM | POA: Diagnosis not present

## 2012-05-03 DIAGNOSIS — M6281 Muscle weakness (generalized): Secondary | ICD-10-CM | POA: Diagnosis not present

## 2012-05-03 DIAGNOSIS — I2589 Other forms of chronic ischemic heart disease: Secondary | ICD-10-CM | POA: Diagnosis not present

## 2012-05-03 DIAGNOSIS — J449 Chronic obstructive pulmonary disease, unspecified: Secondary | ICD-10-CM | POA: Diagnosis not present

## 2012-05-03 DIAGNOSIS — R262 Difficulty in walking, not elsewhere classified: Secondary | ICD-10-CM | POA: Diagnosis not present

## 2012-05-03 DIAGNOSIS — I10 Essential (primary) hypertension: Secondary | ICD-10-CM | POA: Diagnosis not present

## 2012-05-03 DIAGNOSIS — I509 Heart failure, unspecified: Secondary | ICD-10-CM | POA: Diagnosis not present

## 2012-05-03 DIAGNOSIS — I2510 Atherosclerotic heart disease of native coronary artery without angina pectoris: Secondary | ICD-10-CM | POA: Diagnosis not present

## 2012-05-06 ENCOUNTER — Ambulatory Visit (INDEPENDENT_AMBULATORY_CARE_PROVIDER_SITE_OTHER): Payer: Medicare Other | Admitting: General Surgery

## 2012-05-06 ENCOUNTER — Encounter (INDEPENDENT_AMBULATORY_CARE_PROVIDER_SITE_OTHER): Payer: Self-pay | Admitting: General Surgery

## 2012-05-06 VITALS — BP 122/66 | HR 72 | Temp 97.6°F | Resp 16 | Ht 63.5 in | Wt 81.5 lb

## 2012-05-06 DIAGNOSIS — R911 Solitary pulmonary nodule: Secondary | ICD-10-CM | POA: Diagnosis not present

## 2012-05-06 DIAGNOSIS — K56609 Unspecified intestinal obstruction, unspecified as to partial versus complete obstruction: Secondary | ICD-10-CM | POA: Diagnosis not present

## 2012-05-06 DIAGNOSIS — E119 Type 2 diabetes mellitus without complications: Secondary | ICD-10-CM | POA: Diagnosis not present

## 2012-05-06 DIAGNOSIS — K56600 Partial intestinal obstruction, unspecified as to cause: Secondary | ICD-10-CM

## 2012-05-06 DIAGNOSIS — K5900 Constipation, unspecified: Secondary | ICD-10-CM | POA: Diagnosis not present

## 2012-05-06 NOTE — Patient Instructions (Signed)
You have recovered from your abdominal surgery to correct your small bowel obstruction without any obvious surgical complications.  Continue to take a stool softener or prune-juice daily to avoid constipation.  Return to see Dr. Derrell Lolling if further problems arise.

## 2012-05-06 NOTE — Progress Notes (Signed)
Patient ID: Bianca Khan, female   DOB: 15-Feb-1915, 78 y.o.   MRN: [id number] History: This 78 year old woman was admitted on September 11 with a small bowel obstruction. She improved, then relapsed, then improved, then relapsed. I took her to the operating room on October 3 and she underwent lysis of adhesions to release a mechanical SBO from a loop of small bowel in an internal hernia in the pelvis where it was stuck to her vaginal cuff, previous hysterectomy. She recovered without major complication and was discharged home on October 10. [facility] a [facility]. She is ambulatory on a walker. She feels pretty good. Her appetite has mostly returned. Her bowel function is normal. She drinks prune-juice from time to time. She doesn't have any pain.  Exam: Patient looks remarkably well. Her daughter-in-law was with her. No distress. Abdomen soft. Lower midline incision well-healed. No hernia or infection.  Assessment: Adhesive, mechanical small bowel obstruction, uneventful recovery following laparotomy and lysis of adhesions  Plan: Diet and activities discussed. Return to see me if further problems arise.   Angelia Mould. Derrell Lolling, M.D., [facility] General and Minimally invasive Surgery Breast and Colorectal Surgery Office:   [phone] Pager:   [phone]

## 2012-05-07 DIAGNOSIS — M6281 Muscle weakness (generalized): Secondary | ICD-10-CM | POA: Diagnosis not present

## 2012-05-07 DIAGNOSIS — R41840 Attention and concentration deficit: Secondary | ICD-10-CM | POA: Diagnosis not present

## 2012-05-07 DIAGNOSIS — R262 Difficulty in walking, not elsewhere classified: Secondary | ICD-10-CM | POA: Diagnosis not present

## 2012-05-07 DIAGNOSIS — I2589 Other forms of chronic ischemic heart disease: Secondary | ICD-10-CM | POA: Diagnosis not present

## 2012-05-07 DIAGNOSIS — K565 Intestinal adhesions [bands], unspecified as to partial versus complete obstruction: Secondary | ICD-10-CM | POA: Diagnosis not present

## 2012-05-07 DIAGNOSIS — I2510 Atherosclerotic heart disease of native coronary artery without angina pectoris: Secondary | ICD-10-CM | POA: Diagnosis not present

## 2012-05-08 DIAGNOSIS — I2510 Atherosclerotic heart disease of native coronary artery without angina pectoris: Secondary | ICD-10-CM | POA: Diagnosis not present

## 2012-05-08 DIAGNOSIS — R41840 Attention and concentration deficit: Secondary | ICD-10-CM | POA: Diagnosis not present

## 2012-05-08 DIAGNOSIS — I2589 Other forms of chronic ischemic heart disease: Secondary | ICD-10-CM | POA: Diagnosis not present

## 2012-05-08 DIAGNOSIS — M6281 Muscle weakness (generalized): Secondary | ICD-10-CM | POA: Diagnosis not present

## 2012-05-08 DIAGNOSIS — R262 Difficulty in walking, not elsewhere classified: Secondary | ICD-10-CM | POA: Diagnosis not present

## 2012-05-08 DIAGNOSIS — K565 Intestinal adhesions [bands], unspecified as to partial versus complete obstruction: Secondary | ICD-10-CM | POA: Diagnosis not present

## 2012-05-09 DIAGNOSIS — K565 Intestinal adhesions [bands], unspecified as to partial versus complete obstruction: Secondary | ICD-10-CM | POA: Diagnosis not present

## 2012-05-09 DIAGNOSIS — R41840 Attention and concentration deficit: Secondary | ICD-10-CM | POA: Diagnosis not present

## 2012-05-09 DIAGNOSIS — M6281 Muscle weakness (generalized): Secondary | ICD-10-CM | POA: Diagnosis not present

## 2012-05-09 DIAGNOSIS — I2510 Atherosclerotic heart disease of native coronary artery without angina pectoris: Secondary | ICD-10-CM | POA: Diagnosis not present

## 2012-05-09 DIAGNOSIS — R262 Difficulty in walking, not elsewhere classified: Secondary | ICD-10-CM | POA: Diagnosis not present

## 2012-05-09 DIAGNOSIS — I2589 Other forms of chronic ischemic heart disease: Secondary | ICD-10-CM | POA: Diagnosis not present

## 2012-05-11 DIAGNOSIS — K565 Intestinal adhesions [bands], unspecified as to partial versus complete obstruction: Secondary | ICD-10-CM | POA: Diagnosis not present

## 2012-05-11 DIAGNOSIS — R262 Difficulty in walking, not elsewhere classified: Secondary | ICD-10-CM | POA: Diagnosis not present

## 2012-05-11 DIAGNOSIS — R41840 Attention and concentration deficit: Secondary | ICD-10-CM | POA: Diagnosis not present

## 2012-05-11 DIAGNOSIS — I2510 Atherosclerotic heart disease of native coronary artery without angina pectoris: Secondary | ICD-10-CM | POA: Diagnosis not present

## 2012-05-11 DIAGNOSIS — I2589 Other forms of chronic ischemic heart disease: Secondary | ICD-10-CM | POA: Diagnosis not present

## 2012-05-11 DIAGNOSIS — M6281 Muscle weakness (generalized): Secondary | ICD-10-CM | POA: Diagnosis not present

## 2012-05-14 DIAGNOSIS — K565 Intestinal adhesions [bands], unspecified as to partial versus complete obstruction: Secondary | ICD-10-CM | POA: Diagnosis not present

## 2012-05-14 DIAGNOSIS — I2510 Atherosclerotic heart disease of native coronary artery without angina pectoris: Secondary | ICD-10-CM | POA: Diagnosis not present

## 2012-05-14 DIAGNOSIS — R41840 Attention and concentration deficit: Secondary | ICD-10-CM | POA: Diagnosis not present

## 2012-05-14 DIAGNOSIS — M6281 Muscle weakness (generalized): Secondary | ICD-10-CM | POA: Diagnosis not present

## 2012-05-14 DIAGNOSIS — R262 Difficulty in walking, not elsewhere classified: Secondary | ICD-10-CM | POA: Diagnosis not present

## 2012-05-14 DIAGNOSIS — I2589 Other forms of chronic ischemic heart disease: Secondary | ICD-10-CM | POA: Diagnosis not present

## 2012-05-15 DIAGNOSIS — M6281 Muscle weakness (generalized): Secondary | ICD-10-CM | POA: Diagnosis not present

## 2012-05-15 DIAGNOSIS — K565 Intestinal adhesions [bands], unspecified as to partial versus complete obstruction: Secondary | ICD-10-CM | POA: Diagnosis not present

## 2012-05-15 DIAGNOSIS — I2510 Atherosclerotic heart disease of native coronary artery without angina pectoris: Secondary | ICD-10-CM | POA: Diagnosis not present

## 2012-05-15 DIAGNOSIS — R41840 Attention and concentration deficit: Secondary | ICD-10-CM | POA: Diagnosis not present

## 2012-05-15 DIAGNOSIS — I2589 Other forms of chronic ischemic heart disease: Secondary | ICD-10-CM | POA: Diagnosis not present

## 2012-05-15 DIAGNOSIS — R262 Difficulty in walking, not elsewhere classified: Secondary | ICD-10-CM | POA: Diagnosis not present

## 2012-05-17 DIAGNOSIS — I2510 Atherosclerotic heart disease of native coronary artery without angina pectoris: Secondary | ICD-10-CM | POA: Diagnosis not present

## 2012-05-17 DIAGNOSIS — R41840 Attention and concentration deficit: Secondary | ICD-10-CM | POA: Diagnosis not present

## 2012-05-17 DIAGNOSIS — M6281 Muscle weakness (generalized): Secondary | ICD-10-CM | POA: Diagnosis not present

## 2012-05-17 DIAGNOSIS — R262 Difficulty in walking, not elsewhere classified: Secondary | ICD-10-CM | POA: Diagnosis not present

## 2012-05-17 DIAGNOSIS — K565 Intestinal adhesions [bands], unspecified as to partial versus complete obstruction: Secondary | ICD-10-CM | POA: Diagnosis not present

## 2012-05-17 DIAGNOSIS — I2589 Other forms of chronic ischemic heart disease: Secondary | ICD-10-CM | POA: Diagnosis not present

## 2012-05-20 DIAGNOSIS — K565 Intestinal adhesions [bands], unspecified as to partial versus complete obstruction: Secondary | ICD-10-CM | POA: Diagnosis not present

## 2012-05-20 DIAGNOSIS — I2589 Other forms of chronic ischemic heart disease: Secondary | ICD-10-CM | POA: Diagnosis not present

## 2012-05-20 DIAGNOSIS — M6281 Muscle weakness (generalized): Secondary | ICD-10-CM | POA: Diagnosis not present

## 2012-05-20 DIAGNOSIS — R262 Difficulty in walking, not elsewhere classified: Secondary | ICD-10-CM | POA: Diagnosis not present

## 2012-05-20 DIAGNOSIS — R41840 Attention and concentration deficit: Secondary | ICD-10-CM | POA: Diagnosis not present

## 2012-05-20 DIAGNOSIS — I2510 Atherosclerotic heart disease of native coronary artery without angina pectoris: Secondary | ICD-10-CM | POA: Diagnosis not present

## 2012-05-21 DIAGNOSIS — K565 Intestinal adhesions [bands], unspecified as to partial versus complete obstruction: Secondary | ICD-10-CM | POA: Diagnosis not present

## 2012-05-21 DIAGNOSIS — R41840 Attention and concentration deficit: Secondary | ICD-10-CM | POA: Diagnosis not present

## 2012-05-21 DIAGNOSIS — I2510 Atherosclerotic heart disease of native coronary artery without angina pectoris: Secondary | ICD-10-CM | POA: Diagnosis not present

## 2012-05-21 DIAGNOSIS — I2589 Other forms of chronic ischemic heart disease: Secondary | ICD-10-CM | POA: Diagnosis not present

## 2012-05-21 DIAGNOSIS — R262 Difficulty in walking, not elsewhere classified: Secondary | ICD-10-CM | POA: Diagnosis not present

## 2012-05-21 DIAGNOSIS — M6281 Muscle weakness (generalized): Secondary | ICD-10-CM | POA: Diagnosis not present

## 2012-05-22 DIAGNOSIS — R41840 Attention and concentration deficit: Secondary | ICD-10-CM | POA: Diagnosis not present

## 2012-05-22 DIAGNOSIS — I2589 Other forms of chronic ischemic heart disease: Secondary | ICD-10-CM | POA: Diagnosis not present

## 2012-05-22 DIAGNOSIS — M6281 Muscle weakness (generalized): Secondary | ICD-10-CM | POA: Diagnosis not present

## 2012-05-22 DIAGNOSIS — R262 Difficulty in walking, not elsewhere classified: Secondary | ICD-10-CM | POA: Diagnosis not present

## 2012-05-22 DIAGNOSIS — K565 Intestinal adhesions [bands], unspecified as to partial versus complete obstruction: Secondary | ICD-10-CM | POA: Diagnosis not present

## 2012-05-22 DIAGNOSIS — I2510 Atherosclerotic heart disease of native coronary artery without angina pectoris: Secondary | ICD-10-CM | POA: Diagnosis not present

## 2012-05-23 DIAGNOSIS — I2510 Atherosclerotic heart disease of native coronary artery without angina pectoris: Secondary | ICD-10-CM | POA: Diagnosis not present

## 2012-05-23 DIAGNOSIS — M6281 Muscle weakness (generalized): Secondary | ICD-10-CM | POA: Diagnosis not present

## 2012-05-23 DIAGNOSIS — R41840 Attention and concentration deficit: Secondary | ICD-10-CM | POA: Diagnosis not present

## 2012-05-23 DIAGNOSIS — R262 Difficulty in walking, not elsewhere classified: Secondary | ICD-10-CM | POA: Diagnosis not present

## 2012-05-23 DIAGNOSIS — K565 Intestinal adhesions [bands], unspecified as to partial versus complete obstruction: Secondary | ICD-10-CM | POA: Diagnosis not present

## 2012-05-23 DIAGNOSIS — I2589 Other forms of chronic ischemic heart disease: Secondary | ICD-10-CM | POA: Diagnosis not present

## 2012-05-24 DIAGNOSIS — I2589 Other forms of chronic ischemic heart disease: Secondary | ICD-10-CM | POA: Diagnosis not present

## 2012-05-24 DIAGNOSIS — R262 Difficulty in walking, not elsewhere classified: Secondary | ICD-10-CM | POA: Diagnosis not present

## 2012-05-24 DIAGNOSIS — I2510 Atherosclerotic heart disease of native coronary artery without angina pectoris: Secondary | ICD-10-CM | POA: Diagnosis not present

## 2012-05-24 DIAGNOSIS — R41840 Attention and concentration deficit: Secondary | ICD-10-CM | POA: Diagnosis not present

## 2012-05-24 DIAGNOSIS — K565 Intestinal adhesions [bands], unspecified as to partial versus complete obstruction: Secondary | ICD-10-CM | POA: Diagnosis not present

## 2012-05-24 DIAGNOSIS — M6281 Muscle weakness (generalized): Secondary | ICD-10-CM | POA: Diagnosis not present

## 2012-05-27 DIAGNOSIS — R41840 Attention and concentration deficit: Secondary | ICD-10-CM | POA: Diagnosis not present

## 2012-05-27 DIAGNOSIS — I2510 Atherosclerotic heart disease of native coronary artery without angina pectoris: Secondary | ICD-10-CM | POA: Diagnosis not present

## 2012-05-27 DIAGNOSIS — I2589 Other forms of chronic ischemic heart disease: Secondary | ICD-10-CM | POA: Diagnosis not present

## 2012-05-27 DIAGNOSIS — K565 Intestinal adhesions [bands], unspecified as to partial versus complete obstruction: Secondary | ICD-10-CM | POA: Diagnosis not present

## 2012-05-27 DIAGNOSIS — M6281 Muscle weakness (generalized): Secondary | ICD-10-CM | POA: Diagnosis not present

## 2012-05-27 DIAGNOSIS — R262 Difficulty in walking, not elsewhere classified: Secondary | ICD-10-CM | POA: Diagnosis not present

## 2012-05-28 DIAGNOSIS — M6281 Muscle weakness (generalized): Secondary | ICD-10-CM | POA: Diagnosis not present

## 2012-05-28 DIAGNOSIS — I2589 Other forms of chronic ischemic heart disease: Secondary | ICD-10-CM | POA: Diagnosis not present

## 2012-05-28 DIAGNOSIS — K565 Intestinal adhesions [bands], unspecified as to partial versus complete obstruction: Secondary | ICD-10-CM | POA: Diagnosis not present

## 2012-05-28 DIAGNOSIS — R262 Difficulty in walking, not elsewhere classified: Secondary | ICD-10-CM | POA: Diagnosis not present

## 2012-05-28 DIAGNOSIS — R41840 Attention and concentration deficit: Secondary | ICD-10-CM | POA: Diagnosis not present

## 2012-05-28 DIAGNOSIS — I2510 Atherosclerotic heart disease of native coronary artery without angina pectoris: Secondary | ICD-10-CM | POA: Diagnosis not present

## 2012-05-29 DIAGNOSIS — R41840 Attention and concentration deficit: Secondary | ICD-10-CM | POA: Diagnosis not present

## 2012-05-29 DIAGNOSIS — K565 Intestinal adhesions [bands], unspecified as to partial versus complete obstruction: Secondary | ICD-10-CM | POA: Diagnosis not present

## 2012-05-29 DIAGNOSIS — I2589 Other forms of chronic ischemic heart disease: Secondary | ICD-10-CM | POA: Diagnosis not present

## 2012-05-29 DIAGNOSIS — R262 Difficulty in walking, not elsewhere classified: Secondary | ICD-10-CM | POA: Diagnosis not present

## 2012-05-29 DIAGNOSIS — M6281 Muscle weakness (generalized): Secondary | ICD-10-CM | POA: Diagnosis not present

## 2012-05-29 DIAGNOSIS — I2510 Atherosclerotic heart disease of native coronary artery without angina pectoris: Secondary | ICD-10-CM | POA: Diagnosis not present

## 2012-05-31 DIAGNOSIS — I2510 Atherosclerotic heart disease of native coronary artery without angina pectoris: Secondary | ICD-10-CM | POA: Diagnosis not present

## 2012-05-31 DIAGNOSIS — M6281 Muscle weakness (generalized): Secondary | ICD-10-CM | POA: Diagnosis not present

## 2012-05-31 DIAGNOSIS — R262 Difficulty in walking, not elsewhere classified: Secondary | ICD-10-CM | POA: Diagnosis not present

## 2012-05-31 DIAGNOSIS — K565 Intestinal adhesions [bands], unspecified as to partial versus complete obstruction: Secondary | ICD-10-CM | POA: Diagnosis not present

## 2012-05-31 DIAGNOSIS — R41840 Attention and concentration deficit: Secondary | ICD-10-CM | POA: Diagnosis not present

## 2012-05-31 DIAGNOSIS — I2589 Other forms of chronic ischemic heart disease: Secondary | ICD-10-CM | POA: Diagnosis not present

## 2012-06-03 DIAGNOSIS — M6281 Muscle weakness (generalized): Secondary | ICD-10-CM | POA: Diagnosis not present

## 2012-06-03 DIAGNOSIS — I509 Heart failure, unspecified: Secondary | ICD-10-CM | POA: Diagnosis not present

## 2012-06-03 DIAGNOSIS — K565 Intestinal adhesions [bands], unspecified as to partial versus complete obstruction: Secondary | ICD-10-CM | POA: Diagnosis not present

## 2012-06-03 DIAGNOSIS — J449 Chronic obstructive pulmonary disease, unspecified: Secondary | ICD-10-CM | POA: Diagnosis not present

## 2012-06-03 DIAGNOSIS — I2510 Atherosclerotic heart disease of native coronary artery without angina pectoris: Secondary | ICD-10-CM | POA: Diagnosis not present

## 2012-06-03 DIAGNOSIS — R41840 Attention and concentration deficit: Secondary | ICD-10-CM | POA: Diagnosis not present

## 2012-06-03 DIAGNOSIS — R262 Difficulty in walking, not elsewhere classified: Secondary | ICD-10-CM | POA: Diagnosis not present

## 2012-06-04 DIAGNOSIS — I2510 Atherosclerotic heart disease of native coronary artery without angina pectoris: Secondary | ICD-10-CM | POA: Diagnosis not present

## 2012-06-04 DIAGNOSIS — R262 Difficulty in walking, not elsewhere classified: Secondary | ICD-10-CM | POA: Diagnosis not present

## 2012-06-04 DIAGNOSIS — K565 Intestinal adhesions [bands], unspecified as to partial versus complete obstruction: Secondary | ICD-10-CM | POA: Diagnosis not present

## 2012-06-04 DIAGNOSIS — I509 Heart failure, unspecified: Secondary | ICD-10-CM | POA: Diagnosis not present

## 2012-06-04 DIAGNOSIS — M6281 Muscle weakness (generalized): Secondary | ICD-10-CM | POA: Diagnosis not present

## 2012-06-04 DIAGNOSIS — R41840 Attention and concentration deficit: Secondary | ICD-10-CM | POA: Diagnosis not present

## 2012-06-05 DIAGNOSIS — M6281 Muscle weakness (generalized): Secondary | ICD-10-CM | POA: Diagnosis not present

## 2012-06-05 DIAGNOSIS — I2510 Atherosclerotic heart disease of native coronary artery without angina pectoris: Secondary | ICD-10-CM | POA: Diagnosis not present

## 2012-06-05 DIAGNOSIS — R41840 Attention and concentration deficit: Secondary | ICD-10-CM | POA: Diagnosis not present

## 2012-06-05 DIAGNOSIS — K565 Intestinal adhesions [bands], unspecified as to partial versus complete obstruction: Secondary | ICD-10-CM | POA: Diagnosis not present

## 2012-06-05 DIAGNOSIS — R262 Difficulty in walking, not elsewhere classified: Secondary | ICD-10-CM | POA: Diagnosis not present

## 2012-06-05 DIAGNOSIS — I509 Heart failure, unspecified: Secondary | ICD-10-CM | POA: Diagnosis not present

## 2012-06-06 DIAGNOSIS — M6281 Muscle weakness (generalized): Secondary | ICD-10-CM | POA: Diagnosis not present

## 2012-06-06 DIAGNOSIS — K565 Intestinal adhesions [bands], unspecified as to partial versus complete obstruction: Secondary | ICD-10-CM | POA: Diagnosis not present

## 2012-06-06 DIAGNOSIS — R262 Difficulty in walking, not elsewhere classified: Secondary | ICD-10-CM | POA: Diagnosis not present

## 2012-06-06 DIAGNOSIS — I2510 Atherosclerotic heart disease of native coronary artery without angina pectoris: Secondary | ICD-10-CM | POA: Diagnosis not present

## 2012-06-06 DIAGNOSIS — R41840 Attention and concentration deficit: Secondary | ICD-10-CM | POA: Diagnosis not present

## 2012-06-06 DIAGNOSIS — I509 Heart failure, unspecified: Secondary | ICD-10-CM | POA: Diagnosis not present

## 2012-06-07 DIAGNOSIS — R262 Difficulty in walking, not elsewhere classified: Secondary | ICD-10-CM | POA: Diagnosis not present

## 2012-06-07 DIAGNOSIS — M6281 Muscle weakness (generalized): Secondary | ICD-10-CM | POA: Diagnosis not present

## 2012-06-07 DIAGNOSIS — I509 Heart failure, unspecified: Secondary | ICD-10-CM | POA: Diagnosis not present

## 2012-06-07 DIAGNOSIS — K565 Intestinal adhesions [bands], unspecified as to partial versus complete obstruction: Secondary | ICD-10-CM | POA: Diagnosis not present

## 2012-06-07 DIAGNOSIS — R41840 Attention and concentration deficit: Secondary | ICD-10-CM | POA: Diagnosis not present

## 2012-06-07 DIAGNOSIS — I2510 Atherosclerotic heart disease of native coronary artery without angina pectoris: Secondary | ICD-10-CM | POA: Diagnosis not present

## 2012-06-10 DIAGNOSIS — K565 Intestinal adhesions [bands], unspecified as to partial versus complete obstruction: Secondary | ICD-10-CM | POA: Diagnosis not present

## 2012-06-10 DIAGNOSIS — I2510 Atherosclerotic heart disease of native coronary artery without angina pectoris: Secondary | ICD-10-CM | POA: Diagnosis not present

## 2012-06-10 DIAGNOSIS — R41840 Attention and concentration deficit: Secondary | ICD-10-CM | POA: Diagnosis not present

## 2012-06-10 DIAGNOSIS — I509 Heart failure, unspecified: Secondary | ICD-10-CM | POA: Diagnosis not present

## 2012-06-10 DIAGNOSIS — M6281 Muscle weakness (generalized): Secondary | ICD-10-CM | POA: Diagnosis not present

## 2012-06-10 DIAGNOSIS — R262 Difficulty in walking, not elsewhere classified: Secondary | ICD-10-CM | POA: Diagnosis not present

## 2012-06-12 DIAGNOSIS — M6281 Muscle weakness (generalized): Secondary | ICD-10-CM | POA: Diagnosis not present

## 2012-06-12 DIAGNOSIS — I2510 Atherosclerotic heart disease of native coronary artery without angina pectoris: Secondary | ICD-10-CM | POA: Diagnosis not present

## 2012-06-12 DIAGNOSIS — K565 Intestinal adhesions [bands], unspecified as to partial versus complete obstruction: Secondary | ICD-10-CM | POA: Diagnosis not present

## 2012-06-12 DIAGNOSIS — I509 Heart failure, unspecified: Secondary | ICD-10-CM | POA: Diagnosis not present

## 2012-06-12 DIAGNOSIS — R262 Difficulty in walking, not elsewhere classified: Secondary | ICD-10-CM | POA: Diagnosis not present

## 2012-06-12 DIAGNOSIS — R41840 Attention and concentration deficit: Secondary | ICD-10-CM | POA: Diagnosis not present

## 2012-06-16 DIAGNOSIS — M6281 Muscle weakness (generalized): Secondary | ICD-10-CM | POA: Diagnosis not present

## 2012-06-16 DIAGNOSIS — I2510 Atherosclerotic heart disease of native coronary artery without angina pectoris: Secondary | ICD-10-CM | POA: Diagnosis not present

## 2012-06-16 DIAGNOSIS — R41840 Attention and concentration deficit: Secondary | ICD-10-CM | POA: Diagnosis not present

## 2012-06-16 DIAGNOSIS — R262 Difficulty in walking, not elsewhere classified: Secondary | ICD-10-CM | POA: Diagnosis not present

## 2012-06-16 DIAGNOSIS — I509 Heart failure, unspecified: Secondary | ICD-10-CM | POA: Diagnosis not present

## 2012-06-16 DIAGNOSIS — K565 Intestinal adhesions [bands], unspecified as to partial versus complete obstruction: Secondary | ICD-10-CM | POA: Diagnosis not present

## 2012-06-17 DIAGNOSIS — R41840 Attention and concentration deficit: Secondary | ICD-10-CM | POA: Diagnosis not present

## 2012-06-17 DIAGNOSIS — M6281 Muscle weakness (generalized): Secondary | ICD-10-CM | POA: Diagnosis not present

## 2012-06-17 DIAGNOSIS — K565 Intestinal adhesions [bands], unspecified as to partial versus complete obstruction: Secondary | ICD-10-CM | POA: Diagnosis not present

## 2012-06-17 DIAGNOSIS — I509 Heart failure, unspecified: Secondary | ICD-10-CM | POA: Diagnosis not present

## 2012-06-17 DIAGNOSIS — I2510 Atherosclerotic heart disease of native coronary artery without angina pectoris: Secondary | ICD-10-CM | POA: Diagnosis not present

## 2012-06-17 DIAGNOSIS — R262 Difficulty in walking, not elsewhere classified: Secondary | ICD-10-CM | POA: Diagnosis not present

## 2012-06-20 DIAGNOSIS — I2510 Atherosclerotic heart disease of native coronary artery without angina pectoris: Secondary | ICD-10-CM | POA: Diagnosis not present

## 2012-06-20 DIAGNOSIS — M6281 Muscle weakness (generalized): Secondary | ICD-10-CM | POA: Diagnosis not present

## 2012-06-20 DIAGNOSIS — R41840 Attention and concentration deficit: Secondary | ICD-10-CM | POA: Diagnosis not present

## 2012-06-20 DIAGNOSIS — I509 Heart failure, unspecified: Secondary | ICD-10-CM | POA: Diagnosis not present

## 2012-06-20 DIAGNOSIS — K565 Intestinal adhesions [bands], unspecified as to partial versus complete obstruction: Secondary | ICD-10-CM | POA: Diagnosis not present

## 2012-06-20 DIAGNOSIS — R262 Difficulty in walking, not elsewhere classified: Secondary | ICD-10-CM | POA: Diagnosis not present

## 2012-06-24 DIAGNOSIS — I509 Heart failure, unspecified: Secondary | ICD-10-CM | POA: Diagnosis not present

## 2012-06-24 DIAGNOSIS — R41840 Attention and concentration deficit: Secondary | ICD-10-CM | POA: Diagnosis not present

## 2012-06-24 DIAGNOSIS — K565 Intestinal adhesions [bands], unspecified as to partial versus complete obstruction: Secondary | ICD-10-CM | POA: Diagnosis not present

## 2012-06-24 DIAGNOSIS — R262 Difficulty in walking, not elsewhere classified: Secondary | ICD-10-CM | POA: Diagnosis not present

## 2012-06-24 DIAGNOSIS — M6281 Muscle weakness (generalized): Secondary | ICD-10-CM | POA: Diagnosis not present

## 2012-06-24 DIAGNOSIS — I2510 Atherosclerotic heart disease of native coronary artery without angina pectoris: Secondary | ICD-10-CM | POA: Diagnosis not present

## 2012-07-01 DIAGNOSIS — R41840 Attention and concentration deficit: Secondary | ICD-10-CM | POA: Diagnosis not present

## 2012-07-01 DIAGNOSIS — K565 Intestinal adhesions [bands], unspecified as to partial versus complete obstruction: Secondary | ICD-10-CM | POA: Diagnosis not present

## 2012-07-01 DIAGNOSIS — M6281 Muscle weakness (generalized): Secondary | ICD-10-CM | POA: Diagnosis not present

## 2012-07-01 DIAGNOSIS — I509 Heart failure, unspecified: Secondary | ICD-10-CM | POA: Diagnosis not present

## 2012-07-01 DIAGNOSIS — I2510 Atherosclerotic heart disease of native coronary artery without angina pectoris: Secondary | ICD-10-CM | POA: Diagnosis not present

## 2012-07-01 DIAGNOSIS — R262 Difficulty in walking, not elsewhere classified: Secondary | ICD-10-CM | POA: Diagnosis not present

## 2012-07-08 DIAGNOSIS — M6281 Muscle weakness (generalized): Secondary | ICD-10-CM | POA: Diagnosis not present

## 2012-07-08 DIAGNOSIS — R262 Difficulty in walking, not elsewhere classified: Secondary | ICD-10-CM | POA: Diagnosis not present

## 2012-07-08 DIAGNOSIS — I10 Essential (primary) hypertension: Secondary | ICD-10-CM | POA: Diagnosis not present

## 2012-07-08 DIAGNOSIS — I2510 Atherosclerotic heart disease of native coronary artery without angina pectoris: Secondary | ICD-10-CM | POA: Diagnosis not present

## 2012-07-09 DIAGNOSIS — R262 Difficulty in walking, not elsewhere classified: Secondary | ICD-10-CM | POA: Diagnosis not present

## 2012-07-09 DIAGNOSIS — I10 Essential (primary) hypertension: Secondary | ICD-10-CM | POA: Diagnosis not present

## 2012-07-09 DIAGNOSIS — I2510 Atherosclerotic heart disease of native coronary artery without angina pectoris: Secondary | ICD-10-CM | POA: Diagnosis not present

## 2012-07-09 DIAGNOSIS — M6281 Muscle weakness (generalized): Secondary | ICD-10-CM | POA: Diagnosis not present

## 2012-07-10 DIAGNOSIS — M6281 Muscle weakness (generalized): Secondary | ICD-10-CM | POA: Diagnosis not present

## 2012-07-10 DIAGNOSIS — R262 Difficulty in walking, not elsewhere classified: Secondary | ICD-10-CM | POA: Diagnosis not present

## 2012-07-10 DIAGNOSIS — I10 Essential (primary) hypertension: Secondary | ICD-10-CM | POA: Diagnosis not present

## 2012-07-10 DIAGNOSIS — I2510 Atherosclerotic heart disease of native coronary artery without angina pectoris: Secondary | ICD-10-CM | POA: Diagnosis not present

## 2012-07-15 DIAGNOSIS — M6281 Muscle weakness (generalized): Secondary | ICD-10-CM | POA: Diagnosis not present

## 2012-07-15 DIAGNOSIS — I10 Essential (primary) hypertension: Secondary | ICD-10-CM | POA: Diagnosis not present

## 2012-07-15 DIAGNOSIS — I2510 Atherosclerotic heart disease of native coronary artery without angina pectoris: Secondary | ICD-10-CM | POA: Diagnosis not present

## 2012-07-15 DIAGNOSIS — R262 Difficulty in walking, not elsewhere classified: Secondary | ICD-10-CM | POA: Diagnosis not present

## 2012-07-17 DIAGNOSIS — R262 Difficulty in walking, not elsewhere classified: Secondary | ICD-10-CM | POA: Diagnosis not present

## 2012-07-17 DIAGNOSIS — I2510 Atherosclerotic heart disease of native coronary artery without angina pectoris: Secondary | ICD-10-CM | POA: Diagnosis not present

## 2012-07-17 DIAGNOSIS — I10 Essential (primary) hypertension: Secondary | ICD-10-CM | POA: Diagnosis not present

## 2012-07-17 DIAGNOSIS — M6281 Muscle weakness (generalized): Secondary | ICD-10-CM | POA: Diagnosis not present

## 2012-07-22 DIAGNOSIS — I2510 Atherosclerotic heart disease of native coronary artery without angina pectoris: Secondary | ICD-10-CM | POA: Diagnosis not present

## 2012-07-22 DIAGNOSIS — R262 Difficulty in walking, not elsewhere classified: Secondary | ICD-10-CM | POA: Diagnosis not present

## 2012-07-22 DIAGNOSIS — I10 Essential (primary) hypertension: Secondary | ICD-10-CM | POA: Diagnosis not present

## 2012-07-22 DIAGNOSIS — M6281 Muscle weakness (generalized): Secondary | ICD-10-CM | POA: Diagnosis not present

## 2012-07-24 DIAGNOSIS — I10 Essential (primary) hypertension: Secondary | ICD-10-CM | POA: Diagnosis not present

## 2012-07-24 DIAGNOSIS — R262 Difficulty in walking, not elsewhere classified: Secondary | ICD-10-CM | POA: Diagnosis not present

## 2012-07-24 DIAGNOSIS — I2510 Atherosclerotic heart disease of native coronary artery without angina pectoris: Secondary | ICD-10-CM | POA: Diagnosis not present

## 2012-07-24 DIAGNOSIS — M6281 Muscle weakness (generalized): Secondary | ICD-10-CM | POA: Diagnosis not present

## 2012-07-26 ENCOUNTER — Encounter: Payer: Self-pay | Admitting: Geriatric Medicine

## 2012-07-29 DIAGNOSIS — I2510 Atherosclerotic heart disease of native coronary artery without angina pectoris: Secondary | ICD-10-CM | POA: Diagnosis not present

## 2012-07-29 DIAGNOSIS — I10 Essential (primary) hypertension: Secondary | ICD-10-CM | POA: Diagnosis not present

## 2012-07-29 DIAGNOSIS — M6281 Muscle weakness (generalized): Secondary | ICD-10-CM | POA: Diagnosis not present

## 2012-07-29 DIAGNOSIS — R262 Difficulty in walking, not elsewhere classified: Secondary | ICD-10-CM | POA: Diagnosis not present

## 2012-07-31 DIAGNOSIS — I10 Essential (primary) hypertension: Secondary | ICD-10-CM | POA: Diagnosis not present

## 2012-07-31 DIAGNOSIS — R262 Difficulty in walking, not elsewhere classified: Secondary | ICD-10-CM | POA: Diagnosis not present

## 2012-07-31 DIAGNOSIS — M6281 Muscle weakness (generalized): Secondary | ICD-10-CM | POA: Diagnosis not present

## 2012-07-31 DIAGNOSIS — I2510 Atherosclerotic heart disease of native coronary artery without angina pectoris: Secondary | ICD-10-CM | POA: Diagnosis not present

## 2012-08-07 DIAGNOSIS — I2510 Atherosclerotic heart disease of native coronary artery without angina pectoris: Secondary | ICD-10-CM | POA: Diagnosis not present

## 2012-08-07 DIAGNOSIS — I10 Essential (primary) hypertension: Secondary | ICD-10-CM | POA: Diagnosis not present

## 2012-08-07 DIAGNOSIS — R262 Difficulty in walking, not elsewhere classified: Secondary | ICD-10-CM | POA: Diagnosis not present

## 2012-08-07 DIAGNOSIS — M6281 Muscle weakness (generalized): Secondary | ICD-10-CM | POA: Diagnosis not present

## 2012-08-09 DIAGNOSIS — M6281 Muscle weakness (generalized): Secondary | ICD-10-CM | POA: Diagnosis not present

## 2012-08-09 DIAGNOSIS — R262 Difficulty in walking, not elsewhere classified: Secondary | ICD-10-CM | POA: Diagnosis not present

## 2012-08-09 DIAGNOSIS — I2510 Atherosclerotic heart disease of native coronary artery without angina pectoris: Secondary | ICD-10-CM | POA: Diagnosis not present

## 2012-08-09 DIAGNOSIS — I10 Essential (primary) hypertension: Secondary | ICD-10-CM | POA: Diagnosis not present

## 2012-08-12 DIAGNOSIS — R262 Difficulty in walking, not elsewhere classified: Secondary | ICD-10-CM | POA: Diagnosis not present

## 2012-08-12 DIAGNOSIS — I10 Essential (primary) hypertension: Secondary | ICD-10-CM | POA: Diagnosis not present

## 2012-08-12 DIAGNOSIS — I2510 Atherosclerotic heart disease of native coronary artery without angina pectoris: Secondary | ICD-10-CM | POA: Diagnosis not present

## 2012-08-12 DIAGNOSIS — M6281 Muscle weakness (generalized): Secondary | ICD-10-CM | POA: Diagnosis not present

## 2012-08-14 DIAGNOSIS — R262 Difficulty in walking, not elsewhere classified: Secondary | ICD-10-CM | POA: Diagnosis not present

## 2012-08-14 DIAGNOSIS — M6281 Muscle weakness (generalized): Secondary | ICD-10-CM | POA: Diagnosis not present

## 2012-08-14 DIAGNOSIS — I10 Essential (primary) hypertension: Secondary | ICD-10-CM | POA: Diagnosis not present

## 2012-08-14 DIAGNOSIS — I2510 Atherosclerotic heart disease of native coronary artery without angina pectoris: Secondary | ICD-10-CM | POA: Diagnosis not present

## 2012-08-19 DIAGNOSIS — I10 Essential (primary) hypertension: Secondary | ICD-10-CM | POA: Diagnosis not present

## 2012-08-19 DIAGNOSIS — M6281 Muscle weakness (generalized): Secondary | ICD-10-CM | POA: Diagnosis not present

## 2012-08-19 DIAGNOSIS — R262 Difficulty in walking, not elsewhere classified: Secondary | ICD-10-CM | POA: Diagnosis not present

## 2012-08-19 DIAGNOSIS — I2510 Atherosclerotic heart disease of native coronary artery without angina pectoris: Secondary | ICD-10-CM | POA: Diagnosis not present

## 2012-08-26 DIAGNOSIS — I10 Essential (primary) hypertension: Secondary | ICD-10-CM | POA: Diagnosis not present

## 2012-08-26 DIAGNOSIS — M6281 Muscle weakness (generalized): Secondary | ICD-10-CM | POA: Diagnosis not present

## 2012-08-26 DIAGNOSIS — I2510 Atherosclerotic heart disease of native coronary artery without angina pectoris: Secondary | ICD-10-CM | POA: Diagnosis not present

## 2012-09-02 DIAGNOSIS — I2510 Atherosclerotic heart disease of native coronary artery without angina pectoris: Secondary | ICD-10-CM | POA: Diagnosis not present

## 2012-09-02 DIAGNOSIS — M6281 Muscle weakness (generalized): Secondary | ICD-10-CM | POA: Diagnosis not present

## 2012-09-04 DIAGNOSIS — M6281 Muscle weakness (generalized): Secondary | ICD-10-CM | POA: Diagnosis not present

## 2012-09-04 DIAGNOSIS — I2510 Atherosclerotic heart disease of native coronary artery without angina pectoris: Secondary | ICD-10-CM | POA: Diagnosis not present

## 2012-09-23 ENCOUNTER — Encounter: Payer: Self-pay | Admitting: Internal Medicine

## 2012-09-23 ENCOUNTER — Ambulatory Visit (INDEPENDENT_AMBULATORY_CARE_PROVIDER_SITE_OTHER): Payer: Medicare Other | Admitting: Internal Medicine

## 2012-09-23 VITALS — BP 140/62 | HR 62 | Temp 97.2°F | Resp 16 | Ht 61.0 in | Wt 88.6 lb

## 2012-09-23 DIAGNOSIS — K5901 Slow transit constipation: Secondary | ICD-10-CM

## 2012-09-23 DIAGNOSIS — I10 Essential (primary) hypertension: Secondary | ICD-10-CM | POA: Diagnosis not present

## 2012-09-23 DIAGNOSIS — J449 Chronic obstructive pulmonary disease, unspecified: Secondary | ICD-10-CM

## 2012-09-23 DIAGNOSIS — J4489 Other specified chronic obstructive pulmonary disease: Secondary | ICD-10-CM

## 2012-09-23 DIAGNOSIS — E119 Type 2 diabetes mellitus without complications: Secondary | ICD-10-CM

## 2012-09-23 DIAGNOSIS — E46 Unspecified protein-calorie malnutrition: Secondary | ICD-10-CM | POA: Diagnosis not present

## 2012-09-23 MED ORDER — SENNOSIDES-DOCUSATE SODIUM 8.6-50 MG PO TABS: 2.0000 | ORAL_TABLET | ORAL | Status: DC

## 2012-09-23 MED ORDER — GUAIFENESIN ER 600 MG PO TB12: 600.0000 mg | ORAL_TABLET | Freq: Every day | ORAL | Status: DC

## 2012-09-23 NOTE — Assessment & Plan Note (Signed)
Weight has been stable now.  Appetite not fabulous.  Has never been.

## 2012-09-23 NOTE — Assessment & Plan Note (Signed)
Breathing well. Denies chest pain, shortness of breath.  Has some early morning congestion.  Requests mucinex daily to help with hoarseness.

## 2012-09-23 NOTE — Progress Notes (Signed)
  Subjective:    Patient ID: Bianca Khan, female    DOB: 06/28/1915, 79 y.o.   MRN: [id number]  HPI 79 yo female with h/o COPD, dementia, failure to thrive, CAD, CHF, edema, DMII here for f/u of her chronic conditions.  Her concerns are as in a/p.   Review of Systems General:  C/o fatigue, cannot gain weight Skin:  Denies rashes, denies concerning changes HEENT:  Denies headaches, denies visual changes, denies hearing loss, denies nasal congestion, denies sore throat, denies difficulty swallowing CV:  Denies chest pain, denies dyspnea on exertion, denies orthopnea, denies PND, edema has resolved now Pulm:  Denies shortness of breath, denies wheezing, has chronic morning cough and congestion GI:  Denies abdominal pain, c/o constipation with hard stools every other day, denies diarrhea, denies melena, denies hematochezia, denies nausea, denies vomiting, has hemorrhoids GU:  Has some urge urinary incontinence Neurologic:  C/o paresthesias only as she falls asleep at night but not during the day, denies new focal weakness MSK:  Denies joint pain, denies back pain, denies muscle pain, has some generalized weakness, no recent falls Hematology:  Denies bleeding, denies bruising Immunology:  Denies recurrent infections Psychiatry:  Denies changes in memory, denies depression, denies anxiety;  Has clear memory loss present      Objective:   Physical Exam  General:  Frail Caucasian female, well-groomed pt in NAD Skin:  No visible lesions on inspection HEENT:  NCAT, PERRLA, EOMI, no nasal drainage, pharynx without erythema or exudate Neck:  No palpable thyromegaly or mass, trachea midline Lymphatics:  No palpable cervical or supraclavicular nodes CV:  RRR, no M/G/R, +S1/S2 Pulm:  Lungs CTA, no r/r/w Abd:  Soft, nondistended, nontender MSK:  5/5 strength in all 4 extremities with normal ROM, gait steady w/ use of walker Neuro:  CNs II-XII grossly intact, no focal deficits, DTRs 2+, no  tremors Psych:  AAOx2 (not to date), affect normal, repeats self throughout visit and we discussed the same things we always do--head congestion, edema, memory   Here with her daughter-in-law who also appears surprised when we review the same things each visit.       Assessment & Plan:  Hypertension At goal today at 140/62.  No changes necessary.  Not dizzy or lightheaded.  Walks with her walker.    COPD (chronic obstructive pulmonary disease) Breathing well. Denies chest pain, shortness of breath.  Has some early morning congestion.  Requests mucinex daily to help with hoarseness.    Diabetes mellitus Cont aspirin and metformin for this.  Doing well.  No hypoglycemia.  CBGs not checked regularly.  Did not have labs before visit.    Protein calorie malnutrition Weight has been stable now.  Appetite not fabulous.  Has never been.    Constipation, slow transit Bowels move slowly.  Does go every other day.  Stools are hard.  Has hemorrhoids--does have to strain some.  Will schedule senna-s 2 tabs every other day.

## 2012-09-23 NOTE — Assessment & Plan Note (Signed)
Bowels move slowly.  Does go every other day.  Stools are hard.  Has hemorrhoids--does have to strain some.  Will schedule senna-s 2 tabs every other day.

## 2012-09-23 NOTE — Assessment & Plan Note (Signed)
Cont aspirin and metformin for this.  Doing well.  No hypoglycemia.  CBGs not checked regularly.  Did not have labs before visit.

## 2012-09-23 NOTE — Assessment & Plan Note (Signed)
At goal today at 140/62.  No changes necessary.  Not dizzy or lightheaded.  Walks with her walker.

## 2012-09-25 LAB — BASIC METABOLIC PANEL
BUN/Creatinine Ratio: 24 (ref 11–26)
BUN: 25 mg/dL (ref 10–36)
CO2: 31 mmol/L — ABNORMAL HIGH (ref 19–28)
Calcium: 10.7 mg/dL — ABNORMAL HIGH (ref 8.6–10.2)
Chloride: 99 mmol/L (ref 97–108)
Creatinine, Ser: 1.05 mg/dL — ABNORMAL HIGH (ref 0.57–1.00)
GFR calc Af Amer: 52 mL/min/{1.73_m2} — ABNORMAL LOW (ref >59)
GFR calc non Af Amer: 45 mL/min/{1.73_m2} — ABNORMAL LOW (ref >59)
Glucose: 101 mg/dL — ABNORMAL HIGH (ref 65–99)
Potassium: 4.7 mmol/L (ref 3.5–5.2)
Sodium: 143 mmol/L (ref 134–144)

## 2012-09-25 LAB — CBC WITH DIFFERENTIAL/PLATELET
Basophils Absolute: 0.1 10*3/uL (ref 0.0–0.2)
Basos: 0 % (ref 0–3)
Eos: 12 % — ABNORMAL HIGH (ref 0–5)
Eosinophils Absolute: 1.9 10*3/uL — ABNORMAL HIGH (ref 0.0–0.4)
HCT: 40.7 % (ref 34.0–46.6)
Hemoglobin: 13.4 g/dL (ref 11.1–15.9)
Immature Grans (Abs): 0.1 10*3/uL (ref 0.0–0.1)
Immature Granulocytes: 0 % (ref 0–2)
Lymphocytes Absolute: 3.9 10*3/uL — ABNORMAL HIGH (ref 0.7–3.1)
Lymphs: 25 % (ref 14–46)
MCH: 30 pg (ref 26.6–33.0)
MCHC: 32.9 g/dL (ref 31.5–35.7)
MCV: 91 fL (ref 79–97)
Monocytes Absolute: 1.4 10*3/uL — ABNORMAL HIGH (ref 0.1–0.9)
Monocytes: 9 % (ref 4–12)
Neutrophils Absolute: 8.5 10*3/uL — ABNORMAL HIGH (ref 1.4–7.0)
Neutrophils Relative %: 54 % (ref 40–74)
RBC: 4.47 x10E6/uL (ref 3.77–5.28)
RDW: 13.4 % (ref 12.3–15.4)
WBC: 15.8 10*3/uL — ABNORMAL HIGH (ref 3.4–10.8)

## 2012-09-25 LAB — HEMOGLOBIN A1C
Est. average glucose Bld gHb Est-mCnc: 128 mg/dL
Hgb A1c MFr Bld: 6.1 % — ABNORMAL HIGH (ref 4.8–5.6)

## 2012-11-04 DIAGNOSIS — R05 Cough: Secondary | ICD-10-CM | POA: Diagnosis not present

## 2012-11-07 ENCOUNTER — Other Ambulatory Visit: Payer: Self-pay | Admitting: *Deleted

## 2012-11-07 MED ORDER — AZITHROMYCIN 250 MG PO TABS: ORAL_TABLET | ORAL | Status: DC

## 2012-11-07 MED ORDER — MUCINEX DM 30-600 MG PO TB12: ORAL_TABLET | ORAL | Status: DC

## 2012-11-29 ENCOUNTER — Encounter: Payer: Self-pay | Admitting: Internal Medicine

## 2012-12-20 ENCOUNTER — Other Ambulatory Visit: Payer: Self-pay | Admitting: Internal Medicine

## 2012-12-30 ENCOUNTER — Other Ambulatory Visit: Payer: Self-pay | Admitting: Internal Medicine

## 2012-12-30 DIAGNOSIS — K649 Unspecified hemorrhoids: Secondary | ICD-10-CM

## 2012-12-30 MED ORDER — HYDROCORTISONE 2.5 % RE CREA: TOPICAL_CREAM | Freq: Two times a day (BID) | RECTAL | Status: DC

## 2013-01-08 ENCOUNTER — Encounter: Payer: Self-pay | Admitting: *Deleted

## 2013-01-09 ENCOUNTER — Encounter: Payer: Self-pay | Admitting: Internal Medicine

## 2013-01-09 ENCOUNTER — Ambulatory Visit (INDEPENDENT_AMBULATORY_CARE_PROVIDER_SITE_OTHER): Payer: Medicare Other | Admitting: Internal Medicine

## 2013-01-09 ENCOUNTER — Other Ambulatory Visit: Payer: Self-pay | Admitting: Internal Medicine

## 2013-01-09 VITALS — BP 144/76 | HR 97 | Temp 98.3°F | Resp 14 | Ht 61.0 in | Wt 90.8 lb

## 2013-01-09 DIAGNOSIS — J449 Chronic obstructive pulmonary disease, unspecified: Secondary | ICD-10-CM | POA: Diagnosis not present

## 2013-01-09 DIAGNOSIS — K649 Unspecified hemorrhoids: Secondary | ICD-10-CM | POA: Insufficient documentation

## 2013-01-09 DIAGNOSIS — E119 Type 2 diabetes mellitus without complications: Secondary | ICD-10-CM | POA: Diagnosis not present

## 2013-01-09 DIAGNOSIS — I10 Essential (primary) hypertension: Secondary | ICD-10-CM

## 2013-01-09 DIAGNOSIS — K5901 Slow transit constipation: Secondary | ICD-10-CM | POA: Diagnosis not present

## 2013-01-09 DIAGNOSIS — J4489 Other specified chronic obstructive pulmonary disease: Secondary | ICD-10-CM | POA: Diagnosis not present

## 2013-01-09 DIAGNOSIS — J329 Chronic sinusitis, unspecified: Secondary | ICD-10-CM

## 2013-01-09 MED ORDER — HYDROCORTISONE ACETATE 25 MG RE SUPP: 25.0000 mg | Freq: Every day | RECTAL | Status: DC | PRN

## 2013-01-09 NOTE — Assessment & Plan Note (Signed)
Bothersome to her frequently.  Will add prn suppositories.  Cream is not working adequately.

## 2013-01-09 NOTE — Assessment & Plan Note (Signed)
At goal for her age and frailty.

## 2013-01-09 NOTE — Assessment & Plan Note (Signed)
Has been at goal with metformin.  Is gaining a little weight which is ok in her case.

## 2013-01-09 NOTE — Assessment & Plan Note (Signed)
Says this has not been problematic though she is having more difficulty with hemorrhoids.

## 2013-01-09 NOTE — Assessment & Plan Note (Signed)
Stable with spiriva and albuterol

## 2013-01-09 NOTE — Progress Notes (Signed)
Patient ID: Bianca Khan, female   DOB: 1915/05/28, 79 y.o.   MRN: [id number] Location:  [facility] No Known Allergies  Chief Complaint  Patient presents with  . Medical Managment of Chronic Issues    HPI: Patient is a 79 y.o. white female seen in the office today for routine med mgt of chronic conditions.  She lives at [facility].  Feeling good.  One of her concerns every time is that she takes too much medicine.  Notes tingling in the bottoms of her feet briefly when she removes her shoes, then dissipates.  Appetite better lately--gained 3 lbs.  Is trying to get to 100 lbs.    Has no concerns today otherwise.  Has occasional bouts of postnasal drip.    Review of Systems:  Review of Systems  Constitutional: Positive for malaise/fatigue. Negative for fever.  Eyes: Negative for blurred vision.  Respiratory: Negative for shortness of breath.   Cardiovascular: Negative for chest pain.  Gastrointestinal: Negative for constipation, blood in stool and melena.  Genitourinary: Negative for dysuria.  Musculoskeletal: Negative for falls.  Skin: Negative for rash.  Neurological: Negative for dizziness and headaches.  Endo/Heme/Allergies: Does not bruise/bleed easily.  Psychiatric/Behavioral: Positive for depression and memory loss. The patient does not have insomnia.     Past Medical History  Diagnosis Date  . COPD (chronic obstructive pulmonary disease)   . CHF (congestive heart failure)   . Edema   . Myocardial infarction   . Anxiety   . Hypertension   . Anemia   . Leukocytosis   . Acid reflux   . Osteoporosis   . Sinus drainage   . Weakness   . Unspecified intestinal obstruction   . Unspecified constipation   . Type II or unspecified type diabetes mellitus without mention of complication, not stated as uncontrolled   . Solitary pulmonary nodule   . Other abnormal blood chemistry   . Acute bronchitis   . Adult failure to thrive   .  Loss of weight   . Unspecified fall   . Insomnia, unspecified   . Insomnia, unspecified   . Open wound of scalp, without mention of complication   . Atrial fibrillation   . Edema   . Carbuncle and furuncle of other specified sites   . Personal history of methicillin resistant Staphylococcus aureus   . Impacted cerumen   . Chronic airway obstruction, not elsewhere classified   . Coronary atherosclerosis of native coronary artery   . Leukocytosis, unspecified   . Anxiety state, unspecified   . Depressive disorder, not elsewhere classified   . Unspecified essential hypertension   . Cardiomegaly   . Unspecified pleural effusion   . Unspecified pleural effusion   . Esophagitis, unspecified   . Reflux esophagitis   . Duodenal ulcer, unspecified as acute or chronic, without hemorrhage, perforation, or obstruction   . Diaphragmatic hernia without mention of obstruction or gangrene   . Senile osteoporosis   . Abnormal involuntary movements(781.0)   . Abnormality of gait   . Loss of weight   . Other dyspnea and respiratory abnormality     Past Surgical History  Procedure Laterality Date  . Laparotomy  04/04/2012    Procedure: EXPLORATORY LAPAROTOMY;  Surgeon: Ernestene Mention, MD;  Location: [facility] OR;  Service: General;  Laterality: N/A;  . Abdominal hysterectomy  1991  . Hip surgery    . Fracture surgery  2011    hip  Social History:   reports that she has never smoked. She has never used smokeless tobacco. She reports that  drinks alcohol. She reports that she does not use illicit drugs.  Family History  Problem Relation Age of Onset  . Stroke Father     Medications: Patient's Medications  New Prescriptions   HYDROCORTISONE (ANUSOL-HC) 25 MG SUPPOSITORY    Place 1 suppository (25 mg total) rectally daily as needed for hemorrhoids.  Previous Medications   ACETAMINOPHEN (TYLENOL) 325 MG TABLET    Take 650 mg by mouth every 4 (four) hours as needed. Headache or pain    ALBUTEROL (PROVENTIL HFA;VENTOLIN HFA) 108 (90 BASE) MCG/ACT INHALER    Inhale 2 puffs into the lungs every 6 (six) hours as needed. For wheezing cough shortness of breath Use with adapter   ALENDRONATE (FOSAMAX) 70 MG TABLET    TAKE 1 TABLET WEEKLY WITH 8 OUNCES OF WATER AND STAY UPRIGHT FOR 30 MINUTES AFTERWARD   ASPIRIN 81 MG TABLET    Take 81 mg by mouth daily.   BARRIER CREAM (NON-SPECIFIED) CREA    Apply 1 application topically 2 (two) times daily as needed. Moister barrier cream  Apply to buttock as needed for redness   CALCIUM-VITAMIN D-VITAMIN K (VIACTIV PO)    Take 1 each by mouth 2 (two) times daily.    DICLOFENAC SODIUM (VOLTAREN) 1 % GEL    Apply 2 g topically every 8 (eight) hours as needed. For joint pain   DILTIAZEM (CARDIZEM CD) 240 MG 24 HR CAPSULE    Take 240 mg by mouth daily.   DOCUSATE SODIUM (COLACE) 100 MG CAPSULE    Take one tablet once daily   ERGOCALCIFEROL (VITAMIN D2) 50000 UNITS CAPSULE    Take 50,000 Units by mouth once a week. Usually on Sunday   FEEDING SUPPLEMENT (RESOURCE BREEZE) LIQD    Take 1 Container by mouth 2 (two) times daily between meals.   GUAIFENESIN (MUCINEX) 600 MG 12 HR TABLET    Take 1 tablet (600 mg total) by mouth daily after breakfast.   HYDROCHLOROTHIAZIDE (HYDRODIURIL) 25 MG TABLET    TAKE 1 TABLET DAILY FOR EDEMA   HYDROCORTISONE (ANUSOL-HC) 2.5 % RECTAL CREAM    Place rectally 2 (two) times daily. For hemorrhoids   MENTHOL-CETYLPYRIDINIUM (CEPACOL) 3 MG LOZENGE    Take 1 lozenge by mouth every 4 (four) hours as needed. For sore throat   METFORMIN (GLUCOPHAGE) 500 MG TABLET    Take 1 tablet (500 mg total) by mouth daily with breakfast.   MIRTAZAPINE (REMERON) 15 MG TABLET    TAKE 1 TABLET AT BEDTIME   OMEPRAZOLE (PRILOSEC) 20 MG CAPSULE    Take 20 mg by mouth daily.   ONDANSETRON (ZOFRAN) 4 MG TABLET    Take one tablet by mouth every 6 hours as needed for nausea   PHENOL (PHENASEPTIC) 1.4 % LIQD    Use as directed 1 spray in the mouth or  throat as needed. Sore throat   POLYETHYLENE GLYCOL (MIRALAX / GLYCOLAX) PACKET    Take 17 g by mouth daily as needed. constipation   PROTEIN (PROCEL) POWD    Take by mouth 2 (two) times daily.   PROTEIN SUPPLEMENT (RESOURCE BENEPROTEIN) POWD    Take 1 scoop by mouth 2 (two) times daily with a meal.   SENNA-DOCUSATE (SENOKOT-S) 8.6-50 MG PER TABLET    Take 2 tablets by mouth every other day. For constipation   TIOTROPIUM (SPIRIVA) 18 MCG INHALATION CAPSULE  Place 18 mcg into inhaler and inhale daily.  Modified Medications   No medications on file  Discontinued Medications   ASPIRIN 325 MG TABLET    Take 1 tablet (325 mg total) by mouth daily.   AZITHROMYCIN (ZITHROMAX) 250 MG TABLET    Take 500mg  today then one tablet for four days for infection   DEXTROMETHORPHAN-GUAIFENESIN (MUCINEX DM) 30-600 MG TB12    Take one tablet twice daily for 2 weeks with a full glass of water   Physical Exam: Filed Vitals:   01/09/13 1108  BP: 144/76  Pulse: 97  Temp: 98.3 F (36.8 C)  TempSrc: Oral  Resp: 14  Height: 5\' 1"  (1.549 m)  Weight: 90 lb 12.8 oz (41.187 kg)  Physical Exam  Constitutional:  Thin white female, NAD  HENT:  Head: Normocephalic and atraumatic.  Cardiovascular: Normal rate, regular rhythm, normal heart sounds and intact distal pulses.   Pulmonary/Chest: Effort normal and breath sounds normal. No respiratory distress.  Abdominal: Soft. Bowel sounds are normal. She exhibits no distension. There is no tenderness.  Neurological: She is alert.  Oriented to person and place, not time  Skin: Skin is warm and dry.    Labs reviewed: Basic Metabolic Panel:  Recent Labs  65/78/46 0538 04/09/12 0515 04/10/12 0500 04/11/12 0530 09/23/12 1246  NA 138 141 140 138 143  K 3.5 3.7 3.4* 3.6 4.7  CL 105 108 107 103 99  CO2 28 27 27 29  31*  GLUCOSE 92 101* 149* 138* 101*  BUN 10 9 9 9 25   CREATININE 0.46* 0.49* 0.53 0.56 1.05*  CALCIUM 7.4* 7.8* 8.2* 8.4 10.7*  MG 1.5 1.8  --   1.4*  --   PHOS 2.4 2.5  --  3.3  --    Liver Function Tests:  Recent Labs  04/06/12 0355 04/08/12 0538 04/11/12 0530  AST 14 12 20   ALT 7 7 14   ALKPHOS 68 53 56  BILITOT 0.4 0.4 0.2*  PROT 4.7* 4.4* 4.8*  ALBUMIN 2.2* 1.9* 2.2*    Recent Labs  03/23/12 0441  LIPASE 11   No results found for this basename: AMMONIA,  in the last 8760 hours CBC:  Recent Labs  04/07/12 0501 04/09/12 0515 04/10/12 0500 09/23/12 1246  WBC 16.3* 13.6* 12.2* 15.8*  NEUTROABS  --   --   --  8.5*  HGB 11.3* 10.6* 10.8* 13.4  HCT 33.2* 32.2* 33.0* 40.7  MCV 89.7 90.4 90.7 91  PLT 430* 395 401*  --    Lipid Panel:  Recent Labs  04/06/12 0355 04/08/12 0538  TRIG 99 93   Lab Results  Component Value Date   HGBA1C 6.1* 09/23/2012   Assessment/Plan Hemorrhoids Bothersome to her frequently.  Will add prn suppositories.  Cream is not working adequately.  Hypertension At goal for her age and frailty.    Diabetes mellitus Has been at goal with metformin.  Is gaining a little weight which is ok in her case.  COPD (chronic obstructive pulmonary disease) Stable with spiriva and albuterol  Constipation, slow transit Says this has not been problematic though she is having more difficulty with hemorrhoids.     Labs/tests ordered: cbc, cmp before next visit Next appt:  6 mos

## 2013-02-03 DIAGNOSIS — R05 Cough: Secondary | ICD-10-CM | POA: Diagnosis not present

## 2013-02-14 ENCOUNTER — Encounter: Payer: Self-pay | Admitting: Internal Medicine

## 2013-02-14 ENCOUNTER — Ambulatory Visit (INDEPENDENT_AMBULATORY_CARE_PROVIDER_SITE_OTHER): Payer: Medicare Other | Admitting: Internal Medicine

## 2013-02-14 VITALS — BP 150/80 | HR 93 | Temp 98.8°F | Resp 16 | Ht 61.0 in | Wt 89.0 lb

## 2013-02-14 DIAGNOSIS — J449 Chronic obstructive pulmonary disease, unspecified: Secondary | ICD-10-CM

## 2013-02-14 DIAGNOSIS — J329 Chronic sinusitis, unspecified: Secondary | ICD-10-CM

## 2013-02-14 DIAGNOSIS — K649 Unspecified hemorrhoids: Secondary | ICD-10-CM

## 2013-02-14 DIAGNOSIS — Z23 Encounter for immunization: Secondary | ICD-10-CM | POA: Diagnosis not present

## 2013-02-14 DIAGNOSIS — J4489 Other specified chronic obstructive pulmonary disease: Secondary | ICD-10-CM

## 2013-02-14 MED ORDER — ALBUTEROL SULFATE HFA 108 (90 BASE) MCG/ACT IN AERS: 2.0000 | INHALATION_SPRAY | Freq: Four times a day (QID) | RESPIRATORY_TRACT | Status: DC

## 2013-02-14 MED ORDER — GUAIFENESIN ER 600 MG PO TB12: 600.0000 mg | ORAL_TABLET | Freq: Every day | ORAL | Status: DC

## 2013-02-14 NOTE — Progress Notes (Signed)
Patient ID: Bianca Khan, female   DOB: Nov 16, 1914, 79 y.o.   MRN: [id number] Location:  [facility] / [place] [facility]  Code Status: Full code   No Known Allergies  Chief Complaint  Patient presents with  . Acute Visit    cough, light yellow tint/clear mucus    HPI: Patient is a 79 y.o. white female seen in the office today for acute visit due to cough productive of yellow mucus.  She has had no fever or chills and otherwise feels well.  Her appetite is unchanged.  She has actually gained a couple of lbs as of late.  Her mucinex was stopped at her request b/c she said she had too many pills, and I had explained last time that it was usually given only short term, but she now says she wants it indefinitely.    Review of Systems:  Review of Systems  Constitutional: Negative for fever, chills, weight loss and malaise/fatigue.  HENT: Positive for congestion. Negative for ear pain and sore throat.   Eyes: Negative for blurred vision.  Respiratory: Positive for cough, sputum production and wheezing. Negative for shortness of breath and stridor.   Cardiovascular: Negative for chest pain and leg swelling.  Gastrointestinal: Negative for constipation, blood in stool and melena.       Hemorrhoids  Genitourinary: Negative for dysuria, urgency and frequency.  Musculoskeletal: Negative for myalgias, back pain, joint pain and falls.  Skin: Negative for rash.  Neurological: Negative for dizziness, focal weakness, loss of consciousness, weakness and headaches.  Endo/Heme/Allergies: Bruises/bleeds easily.  Psychiatric/Behavioral: Positive for memory loss. Negative for depression. The patient is not nervous/anxious and does not have insomnia.      Past Medical History  Diagnosis Date  . COPD (chronic obstructive pulmonary disease)   . CHF (congestive heart failure)   . Edema   . Myocardial infarction   . Anxiety   . Hypertension   . Anemia   . Leukocytosis   . Acid  reflux   . Osteoporosis   . Sinus drainage   . Weakness   . Unspecified intestinal obstruction   . Unspecified constipation   . Type II or unspecified type diabetes mellitus without mention of complication, not stated as uncontrolled   . Solitary pulmonary nodule   . Other abnormal blood chemistry   . Acute bronchitis   . Adult failure to thrive   . Loss of weight   . Unspecified fall   . Insomnia, unspecified   . Insomnia, unspecified   . Open wound of scalp, without mention of complication   . Atrial fibrillation   . Edema   . Carbuncle and furuncle of other specified sites   . Personal history of methicillin resistant Staphylococcus aureus   . Impacted cerumen   . Chronic airway obstruction, not elsewhere classified   . Coronary atherosclerosis of native coronary artery   . Leukocytosis, unspecified   . Anxiety state, unspecified   . Depressive disorder, not elsewhere classified   . Unspecified essential hypertension   . Cardiomegaly   . Unspecified pleural effusion   . Unspecified pleural effusion   . Esophagitis, unspecified   . Reflux esophagitis   . Duodenal ulcer, unspecified as acute or chronic, without hemorrhage, perforation, or obstruction   . Diaphragmatic hernia without mention of obstruction or gangrene   . Senile osteoporosis   . Abnormal involuntary movements(781.0)   . Abnormality of gait   . Loss of weight   .  Other dyspnea and respiratory abnormality     Past Surgical History  Procedure Laterality Date  . Laparotomy  04/04/2012    Procedure: EXPLORATORY LAPAROTOMY;  Surgeon: Ernestene Mention, MD;  Location: [facility] OR;  Service: General;  Laterality: N/A;  . Abdominal hysterectomy  1991  . Hip surgery    . Fracture surgery  2011    hip    Social History:   reports that she has never smoked. She has never used smokeless tobacco. She reports that  drinks alcohol. She reports that she does not use illicit drugs.  Family History  Problem Relation Age  of Onset  . Stroke Father     Medications: Patient's Medications  New Prescriptions   No medications on file  Previous Medications   ACETAMINOPHEN (TYLENOL) 325 MG TABLET    Take 650 mg by mouth every 4 (four) hours as needed. Headache or pain   ALBUTEROL (PROVENTIL HFA;VENTOLIN HFA) 108 (90 BASE) MCG/ACT INHALER    Inhale 2 puffs into the lungs every 6 (six) hours as needed. For wheezing cough shortness of breath Use with adapter   ALENDRONATE (FOSAMAX) 70 MG TABLET    TAKE 1 TABLET WEEKLY WITH 8 OUNCES OF WATER AND STAY UPRIGHT FOR 30 MINUTES AFTERWARD   ASPIRIN 81 MG TABLET    Take 81 mg by mouth daily.   BARRIER CREAM (NON-SPECIFIED) CREA    Apply 1 application topically 2 (two) times daily as needed. Moister barrier cream  Apply to buttock as needed for redness   CALCIUM-VITAMIN D-VITAMIN K (VIACTIV PO)    Take 1 each by mouth 2 (two) times daily.    DICLOFENAC SODIUM (VOLTAREN) 1 % GEL    Apply 2 g topically every 8 (eight) hours as needed. For joint pain   DILTIAZEM (CARDIZEM CD) 240 MG 24 HR CAPSULE    Take 240 mg by mouth daily.   DOCUSATE SODIUM (COLACE) 100 MG CAPSULE    Take one tablet once daily   ERGOCALCIFEROL (VITAMIN D2) 50000 UNITS CAPSULE    Take 50,000 Units by mouth once a week. Usually on Sunday   FEEDING SUPPLEMENT (RESOURCE BREEZE) LIQD    Take 1 Container by mouth 2 (two) times daily between meals.   GUAIFENESIN (MUCINEX) 600 MG 12 HR TABLET    Take 1 tablet (600 mg total) by mouth daily after breakfast.   HYDROCHLOROTHIAZIDE (HYDRODIURIL) 25 MG TABLET    TAKE 1 TABLET DAILY FOR EDEMA   HYDROCORTISONE (ANUSOL-HC) 2.5 % RECTAL CREAM    Place rectally 2 (two) times daily. For hemorrhoids   HYDROCORTISONE (ANUSOL-HC) 25 MG SUPPOSITORY    Place 1 suppository (25 mg total) rectally daily as needed for hemorrhoids.   MENTHOL-CETYLPYRIDINIUM (CEPACOL) 3 MG LOZENGE    Take 1 lozenge by mouth every 4 (four) hours as needed. For sore throat   METFORMIN (GLUCOPHAGE) 500 MG  TABLET    Take 1 tablet (500 mg total) by mouth daily with breakfast.   MIRTAZAPINE (REMERON) 15 MG TABLET    TAKE 1 TABLET AT BEDTIME   OMEPRAZOLE (PRILOSEC) 20 MG CAPSULE    Take 20 mg by mouth daily.   ONDANSETRON (ZOFRAN) 4 MG TABLET    Take one tablet by mouth every 6 hours as needed for nausea   PHENOL (PHENASEPTIC) 1.4 % LIQD    Use as directed 1 spray in the mouth or throat as needed. Sore throat   POLYETHYLENE GLYCOL (MIRALAX / GLYCOLAX) PACKET    Take 17  g by mouth daily as needed. constipation   PROTEIN (PROCEL) POWD    Take by mouth 2 (two) times daily.   PROTEIN SUPPLEMENT (RESOURCE BENEPROTEIN) POWD    Take 1 scoop by mouth 2 (two) times daily with a meal.   SENNA-DOCUSATE (SENOKOT-S) 8.6-50 MG PER TABLET    Take 2 tablets by mouth every other day. For constipation   TIOTROPIUM (SPIRIVA) 18 MCG INHALATION CAPSULE    Place 18 mcg into inhaler and inhale daily.  Modified Medications   No medications on file  Discontinued Medications   No medications on file     Physical Exam: Filed Vitals:   02/14/13 1059  BP: 150/80  Pulse: 93  Temp: 98.8 F (37.1 C)  TempSrc: Oral  Resp: 16  Height: 5\' 1"  (1.549 m)  Weight: 89 lb (40.37 kg)  SpO2: 95%  Physical Exam  Constitutional:  Frail elderly female, well-dressed and groomed, pleasant  HENT:  Head: Normocephalic and atraumatic.  Eyes: Pupils are equal, round, and reactive to light.  Cardiovascular: Normal rate, regular rhythm, normal heart sounds and intact distal pulses.   Pulmonary/Chest: Effort normal. No respiratory distress. She has no wheezes.  Scattered rhonchi anteriorly, clear posteriorly  Abdominal: Soft. Bowel sounds are normal.  Musculoskeletal: Normal range of motion. She exhibits no edema and no tenderness.  Walks with walker  Neurological: She is alert. No cranial nerve deficit.  Oriented to person and place, not time  Skin: Skin is warm and dry.    Labs reviewed: Basic Metabolic Panel:  Recent Labs   04/08/12 0538 04/09/12 0515 04/10/12 0500 04/11/12 0530 09/23/12 1246  NA 138 141 140 138 143  K 3.5 3.7 3.4* 3.6 4.7  CL 105 108 107 103 99  CO2 28 27 27 29  31*  GLUCOSE 92 101* 149* 138* 101*  BUN 10 9 9 9 25   CREATININE 0.46* 0.49* 0.53 0.56 1.05*  CALCIUM 7.4* 7.8* 8.2* 8.4 10.7*  MG 1.5 1.8  --  1.4*  --   PHOS 2.4 2.5  --  3.3  --    Liver Function Tests:  Recent Labs  04/06/12 0355 04/08/12 0538 04/11/12 0530  AST 14 12 20   ALT 7 7 14   ALKPHOS 68 53 56  BILITOT 0.4 0.4 0.2*  PROT 4.7* 4.4* 4.8*  ALBUMIN 2.2* 1.9* 2.2*    Recent Labs  03/23/12 0441  LIPASE 11   No results found for this basename: AMMONIA,  in the last 8760 hours CBC:  Recent Labs  04/07/12 0501 04/09/12 0515 04/10/12 0500 09/23/12 1246  WBC 16.3* 13.6* 12.2* 15.8*  NEUTROABS  --   --   --  8.5*  HGB 11.3* 10.6* 10.8* 13.4  HCT 33.2* 32.2* 33.0* 40.7  MCV 89.7 90.4 90.7 91  PLT 430* 395 401*  --    Lipid Panel:  Recent Labs  04/06/12 0355 04/08/12 0538  TRIG 99 93   Lab Results  Component Value Date   HGBA1C 6.1* 09/23/2012   Assessment/Plan 1. COPD (chronic obstructive pulmonary disease) -worse recently, but has no evidence of acute exacerbation to speak of--she always has productive cough with white to yellow sputum--her mucinex got stopped at her request and now she is bringing up the sputum again -resume the mucinex indefinitely as well as scheduling her proventil b/c she is not getting it as needed at this point - albuterol (PROVENTIL HFA;VENTOLIN HFA) 108 (90 BASE) MCG/ACT inhaler; Inhale 2 puffs into the lungs every 6 (six) hours.  For wheezing cough shortness of breath Use with adapter  Dispense: 1 Inhaler; Refill: 5 - guaiFENesin (MUCINEX) 600 MG 12 hr tablet; Take 1 tablet (600 mg total) by mouth daily after breakfast.  Dispense: 30 tablet; Refill: 5 - Pneumococcal polysaccharide vaccine 23-valent greater than or equal to 2yo subcutaneous/IM  2.  Hemorrhoids --stable recently--constipation has been well controlled --discussed using suppositories for the hemorrhoids once every few weeks if she needed due to hemorrhoids moving externally  3. Sinusitis, chronic --also contributes to post nasal drip and chronic cough, but she is not currently bothered by any head congestion or headaches--no changes made to her regimen for this  Next appt: as scheduled

## 2013-02-18 ENCOUNTER — Encounter: Payer: Self-pay | Admitting: Internal Medicine

## 2013-03-05 ENCOUNTER — Other Ambulatory Visit: Payer: Self-pay | Admitting: Internal Medicine

## 2013-03-08 ENCOUNTER — Other Ambulatory Visit: Payer: Self-pay | Admitting: Internal Medicine

## 2013-03-14 ENCOUNTER — Encounter: Payer: Self-pay | Admitting: Internal Medicine

## 2013-03-26 ENCOUNTER — Encounter: Payer: Self-pay | Admitting: Internal Medicine

## 2013-03-27 ENCOUNTER — Ambulatory Visit (INDEPENDENT_AMBULATORY_CARE_PROVIDER_SITE_OTHER): Payer: Medicare Other | Admitting: Nurse Practitioner

## 2013-03-27 ENCOUNTER — Encounter: Payer: Self-pay | Admitting: Nurse Practitioner

## 2013-03-27 ENCOUNTER — Other Ambulatory Visit: Payer: Self-pay | Admitting: Internal Medicine

## 2013-03-27 ENCOUNTER — Encounter: Payer: Self-pay | Admitting: Internal Medicine

## 2013-03-27 VITALS — BP 142/78 | HR 72 | Temp 98.7°F | Resp 18 | Ht 61.0 in | Wt 90.8 lb

## 2013-03-27 DIAGNOSIS — K5901 Slow transit constipation: Secondary | ICD-10-CM

## 2013-03-27 MED ORDER — ALBUTEROL SULFATE HFA 108 (90 BASE) MCG/ACT IN AERS: 2.0000 | INHALATION_SPRAY | Freq: Four times a day (QID) | RESPIRATORY_TRACT | Status: DC | PRN

## 2013-03-27 MED ORDER — SENNOSIDES-DOCUSATE SODIUM 8.6-50 MG PO TABS: 1.0000 | ORAL_TABLET | Freq: Every day | ORAL | Status: DC | PRN

## 2013-03-27 NOTE — Telephone Encounter (Signed)
Please advise on alternative.  

## 2013-03-27 NOTE — Progress Notes (Signed)
Patient ID: Bianca Khan, female   DOB: [birth year], 79 y.o.   MRN: [id number]   No Known Allergies  Chief Complaint  Patient presents with  . Acute Visit    change in meds, flu shot    HPI: Patient is a 79 y.o. female seen in the office today for hemorrhoids and due to the fact that she is having too many stools; reports they (nursing facility) is giving her too many medicines  Reports she has frequent soft BMs but her hemmroids "come down" and wants them to go away.   Review of Systems:  Review of Systems  Respiratory: Negative for shortness of breath.   Cardiovascular: Negative for chest pain.  Gastrointestinal: Positive for diarrhea. Negative for heartburn, abdominal pain and constipation.       Non- painful hemorrhoids   Psychiatric/Behavioral: Positive for memory loss.     Past Medical History  Diagnosis Date  . COPD (chronic obstructive pulmonary disease)   . CHF (congestive heart failure)   . Edema   . Myocardial infarction   . Anxiety   . Hypertension   . Anemia   . Leukocytosis   . Acid reflux   . Osteoporosis   . Sinus drainage   . Weakness   . Unspecified intestinal obstruction   . Unspecified constipation   . Type II or unspecified type diabetes mellitus without mention of complication, not stated as uncontrolled   . Solitary pulmonary nodule   . Other abnormal blood chemistry   . Acute bronchitis   . Adult failure to thrive   . Loss of weight   . Unspecified fall   . Insomnia, unspecified   . Insomnia, unspecified   . Open wound of scalp, without mention of complication   . Atrial fibrillation   . Edema   . Carbuncle and furuncle of other specified sites   . Personal history of methicillin resistant Staphylococcus aureus   . Impacted cerumen   . Chronic airway obstruction, not elsewhere classified   . Coronary atherosclerosis of native coronary artery   . Leukocytosis, unspecified   . Anxiety state, unspecified   . Depressive disorder, not  elsewhere classified   . Unspecified essential hypertension   . Cardiomegaly   . Unspecified pleural effusion   . Unspecified pleural effusion   . Esophagitis, unspecified   . Reflux esophagitis   . Duodenal ulcer, unspecified as acute or chronic, without hemorrhage, perforation, or obstruction   . Diaphragmatic hernia without mention of obstruction or gangrene   . Senile osteoporosis   . Abnormal involuntary movements(781.0)   . Abnormality of gait   . Loss of weight   . Other dyspnea and respiratory abnormality    Past Surgical History  Procedure Laterality Date  . Laparotomy  04/04/2012    Procedure: EXPLORATORY LAPAROTOMY;  Surgeon: Ernestene Mention, MD;  Location: [facility] OR;  Service: General;  Laterality: N/A;  . Abdominal hysterectomy  1991  . Hip surgery    . Fracture surgery  2011    hip   Social History:   reports that she has never smoked. She has never used smokeless tobacco. She reports that  drinks alcohol. She reports that she does not use illicit drugs.  Family History  Problem Relation Age of Onset  . Stroke Father     Medications: Patient's Medications  New Prescriptions   No medications on file  Previous Medications   ACETAMINOPHEN (TYLENOL) 325 MG TABLET    Take 650 mg by  mouth every 4 (four) hours as needed. Headache or pain   ALBUTEROL (PROVENTIL HFA;VENTOLIN HFA) 108 (90 BASE) MCG/ACT INHALER    Inhale 2 puffs into the lungs every 6 (six) hours. For wheezing cough shortness of breath Use with adapter   ALENDRONATE (FOSAMAX) 70 MG TABLET    TAKE 1 TABLET WEEKLY WITH 8 OUNCES OF WATER AND STAY UPRIGHT FOR 30 MINUTES AFTERWARD   ASPIRIN 81 MG TABLET    Take 81 mg by mouth daily.   BARRIER CREAM (NON-SPECIFIED) CREA    Apply 1 application topically 2 (two) times daily as needed. Moister barrier cream  Apply to buttock as needed for redness   CALCIUM-VITAMIN D-VITAMIN K (VIACTIV PO)    Take 1 each by mouth 2 (two) times daily.    DICLOFENAC SODIUM (VOLTAREN)  1 % GEL    Apply 2 g topically every 8 (eight) hours as needed. For joint pain   DILTIAZEM (CARDIZEM CD) 240 MG 24 HR CAPSULE    TAKE 1 CAPSULE DAILY FOR HEART RHYTHM   DOCUSATE SODIUM (COLACE) 100 MG CAPSULE    Take one tablet once daily   FEEDING SUPPLEMENT (RESOURCE BREEZE) LIQD    Take 1 Container by mouth 2 (two) times daily between meals.   GUAIFENESIN (MUCINEX) 600 MG 12 HR TABLET    Take 1 tablet (600 mg total) by mouth daily after breakfast.   HYDROCHLOROTHIAZIDE (HYDRODIURIL) 25 MG TABLET    TAKE 1 TABLET DAILY FOR EDEMA   HYDROCORTISONE (ANUSOL-HC) 2.5 % RECTAL CREAM    Place rectally 2 (two) times daily. For hemorrhoids   HYDROCORTISONE (ANUSOL-HC) 25 MG SUPPOSITORY    Place 1 suppository (25 mg total) rectally daily as needed for hemorrhoids.   MENTHOL-CETYLPYRIDINIUM (CEPACOL) 3 MG LOZENGE    Take 1 lozenge by mouth every 4 (four) hours as needed. For sore throat   METFORMIN (GLUCOPHAGE) 500 MG TABLET    Take 1 tablet (500 mg total) by mouth daily with breakfast.   MIRTAZAPINE (REMERON) 15 MG TABLET    TAKE 1 TABLET AT BEDTIME   OMEPRAZOLE (PRILOSEC) 20 MG CAPSULE    TAKE 1 CAPSULE DAILY TO REDUCE STOMACH ACID AND TO HELP PROTECT ESOPHAGUS   ONDANSETRON (ZOFRAN) 4 MG TABLET    Take one tablet by mouth every 6 hours as needed for nausea   POLYETHYLENE GLYCOL (MIRALAX / GLYCOLAX) PACKET    Take 17 g by mouth daily as needed. constipation   PROTEIN (PROCEL) POWD    Take by mouth 2 (two) times daily.   PROTEIN SUPPLEMENT (RESOURCE BENEPROTEIN) POWD    Take 1 scoop by mouth 2 (two) times daily with a meal.   SENNA-DOCUSATE (SENOKOT-S) 8.6-50 MG PER TABLET    Take 2 tablets by mouth every other day. For constipation   TIOTROPIUM (SPIRIVA) 18 MCG INHALATION CAPSULE    Place 18 mcg into inhaler and inhale daily.   VITAMIN D, ERGOCALCIFEROL, (DRISDOL) 50000 UNITS CAPS CAPSULE    TAKE 1 CAPSULE ONCE A WEEK FOR VITAMIN D DEFICIENCY  Modified Medications   No medications on file  Discontinued  Medications   PHENOL (PHENASEPTIC) 1.4 % LIQD    Use as directed 1 spray in the mouth or throat as needed. Sore throat     Physical Exam:  Filed Vitals:   03/27/13 1001  BP: 142/78  Pulse: 72  Temp: 98.7 F (37.1 C)  TempSrc: Oral  Resp: 18  Height: 5\' 1"  (1.549 m)  Weight: 90 lb 12.8  oz (41.187 kg)  SpO2: 96%    Physical Exam  Constitutional: She is well-developed, well-nourished, and in no distress. No distress.  Cardiovascular: Normal rate.   Pulmonary/Chest: Effort normal and breath sounds normal. No respiratory distress.  Abdominal: Soft. Bowel sounds are normal. She exhibits no distension. There is no tenderness.  Genitourinary: Rectal exam shows external hemorrhoid.  Increased amounts of soft stool in rectal vault  Neurological: She is alert.  Skin: Skin is warm and dry. She is not diaphoretic.     Labs reviewed: Basic Metabolic Panel:  Recent Labs  16/10/96 0538 04/09/12 0515 04/10/12 0500 04/11/12 0530 09/23/12 1246  NA 138 141 140 138 143  K 3.5 3.7 3.4* 3.6 4.7  CL 105 108 107 103 99  CO2 28 27 27 29  31*  GLUCOSE 92 101* 149* 138* 101*  BUN 10 9 9 9 25   CREATININE 0.46* 0.49* 0.53 0.56 1.05*  CALCIUM 7.4* 7.8* 8.2* 8.4 10.7*  MG 1.5 1.8  --  1.4*  --   PHOS 2.4 2.5  --  3.3  --    Liver Function Tests:  Recent Labs  04/06/12 0355 04/08/12 0538 04/11/12 0530  AST 14 12 20   ALT 7 7 14   ALKPHOS 68 53 56  BILITOT 0.4 0.4 0.2*  PROT 4.7* 4.4* 4.8*  ALBUMIN 2.2* 1.9* 2.2*   No results found for this basename: LIPASE, AMYLASE,  in the last 8760 hours No results found for this basename: AMMONIA,  in the last 8760 hours CBC:  Recent Labs  04/07/12 0501 04/09/12 0515 04/10/12 0500 09/23/12 1246  WBC 16.3* 13.6* 12.2* 15.8*  NEUTROABS  --   --   --  8.5*  HGB 11.3* 10.6* 10.8* 13.4  HCT 33.2* 32.2* 33.0* 40.7  MCV 89.7 90.4 90.7 91  PLT 430* 395 401*  --    Lipid Panel:  Recent Labs  04/06/12 0355 04/08/12 0538  TRIG 99 93    Assessment/Plan 1. Constipation, slow transit Now with soft stools and increase in frequency Will decrease senna S to as needed and to hold colace for 3 days To call if she has any questions or concerns or needs advice regarding her bowel regimen  - senna-docusate (SENOKOT-S) 8.6-50 MG per tablet; Take 1 tablet by mouth daily as needed for constipation. For constipation  Dispense: 30 tablet; Refill: 5

## 2013-04-04 ENCOUNTER — Encounter: Payer: Self-pay | Admitting: Internal Medicine

## 2013-05-08 ENCOUNTER — Other Ambulatory Visit: Payer: Self-pay

## 2013-05-20 ENCOUNTER — Encounter: Payer: Self-pay | Admitting: Internal Medicine

## 2013-05-27 ENCOUNTER — Other Ambulatory Visit: Payer: Self-pay | Admitting: Internal Medicine

## 2013-05-28 IMAGING — CR DG CHEST 1V PORT
1 series · 1 of 1 positions shown · non-contrast
Comparison: 03/23/2012

CLINICAL DATA: Preoperative respiratory evaluation for colon
surgery

PORTABLE CHEST - 1 VIEW

[AP]
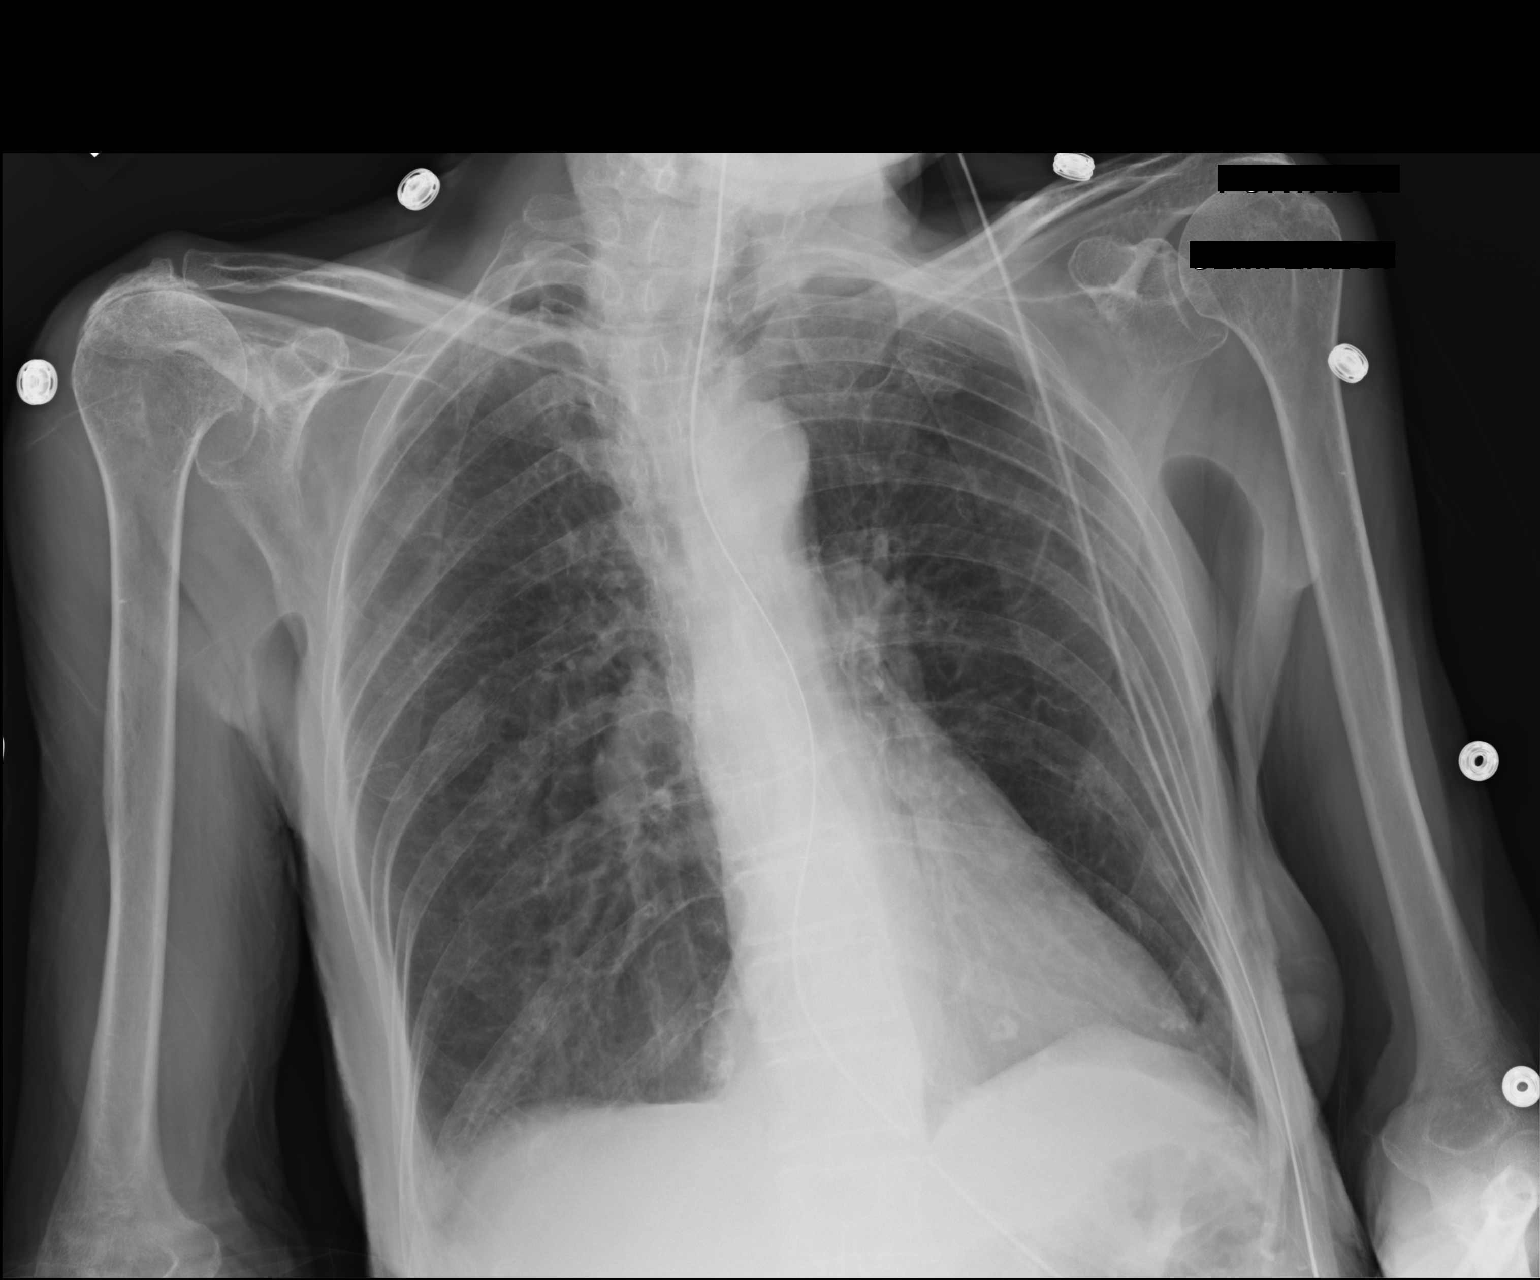

[1 of 1 positions shown; findings below may reference images not displayed]

FINDINGS: 3068 hours.  Hyperexpansion is consistent with emphysema.
The cardiopericardial silhouette is enlarged. The NG tube passes
into the stomach although the distal tip position is not included
on the film. The lungs are clear without focal infiltrate, edema,
pneumothorax or pleural effusion.  Old posterior right rib
fractures noted.  Bones are diffusely demineralized.
IMPRESSION: Emphysema with interstitial coarsening.  No acute cardiopulmonary
process.

## 2013-05-30 ENCOUNTER — Encounter: Payer: Self-pay | Admitting: Internal Medicine

## 2013-07-01 ENCOUNTER — Encounter: Payer: Self-pay | Admitting: Internal Medicine

## 2013-07-03 ENCOUNTER — Encounter: Payer: Self-pay | Admitting: Internal Medicine

## 2013-07-08 ENCOUNTER — Other Ambulatory Visit: Payer: Medicare Other

## 2013-07-08 DIAGNOSIS — J329 Chronic sinusitis, unspecified: Secondary | ICD-10-CM

## 2013-07-08 DIAGNOSIS — I10 Essential (primary) hypertension: Secondary | ICD-10-CM

## 2013-07-08 DIAGNOSIS — E119 Type 2 diabetes mellitus without complications: Secondary | ICD-10-CM

## 2013-07-09 ENCOUNTER — Other Ambulatory Visit: Payer: Self-pay | Admitting: *Deleted

## 2013-07-09 LAB — COMPREHENSIVE METABOLIC PANEL
ALT: 8 IU/L (ref 0–32)
AST: 16 IU/L (ref 0–40)
Albumin/Globulin Ratio: 2 (ref 1.1–2.5)
Albumin: 4.5 g/dL (ref 3.2–4.6)
Alkaline Phosphatase: 93 IU/L (ref 39–117)
BUN/Creatinine Ratio: 20 (ref 11–26)
BUN: 20 mg/dL (ref 10–36)
CO2: 29 mmol/L (ref 18–29)
Calcium: 11.2 mg/dL — ABNORMAL HIGH (ref 8.6–10.2)
Chloride: 99 mmol/L (ref 97–108)
Creatinine, Ser: 1 mg/dL (ref 0.57–1.00)
GFR calc Af Amer: 55 mL/min/{1.73_m2} — ABNORMAL LOW (ref >59)
GFR calc non Af Amer: 48 mL/min/{1.73_m2} — ABNORMAL LOW (ref >59)
Globulin, Total: 2.3 g/dL (ref 1.5–4.5)
Glucose: 131 mg/dL — ABNORMAL HIGH (ref 65–99)
Potassium: 4.7 mmol/L (ref 3.5–5.2)
Sodium: 145 mmol/L — ABNORMAL HIGH (ref 134–144)
Total Bilirubin: 0.4 mg/dL (ref 0.0–1.2)
Total Protein: 6.8 g/dL (ref 6.0–8.5)

## 2013-07-09 LAB — CBC WITH DIFFERENTIAL/PLATELET
Basophils Absolute: 0.1 10*3/uL (ref 0.0–0.2)
Basos: 1 %
Eos: 13 %
Eosinophils Absolute: 2 10*3/uL — ABNORMAL HIGH (ref 0.0–0.4)
HCT: 40.8 % (ref 34.0–46.6)
Hemoglobin: 14.1 g/dL (ref 11.1–15.9)
Immature Grans (Abs): 0 10*3/uL (ref 0.0–0.1)
Immature Granulocytes: 0 %
Lymphocytes Absolute: 3.9 10*3/uL — ABNORMAL HIGH (ref 0.7–3.1)
Lymphs: 24 %
MCH: 31.3 pg (ref 26.6–33.0)
MCHC: 34.6 g/dL (ref 31.5–35.7)
MCV: 91 fL (ref 79–97)
Monocytes Absolute: 2 10*3/uL — ABNORMAL HIGH (ref 0.1–0.9)
Monocytes: 13 %
Neutrophils Absolute: 7.7 10*3/uL — ABNORMAL HIGH (ref 1.4–7.0)
Neutrophils Relative %: 49 %
RBC: 4.51 x10E6/uL (ref 3.77–5.28)
RDW: 12.6 % (ref 12.3–15.4)
WBC: 15.8 10*3/uL — ABNORMAL HIGH (ref 3.4–10.8)

## 2013-07-09 MED ORDER — ALBUTEROL SULFATE HFA 108 (90 BASE) MCG/ACT IN AERS: 2.0000 | INHALATION_SPRAY | Freq: Four times a day (QID) | RESPIRATORY_TRACT | Status: DC | PRN

## 2013-07-10 ENCOUNTER — Encounter: Payer: Self-pay | Admitting: Internal Medicine

## 2013-07-10 ENCOUNTER — Ambulatory Visit (INDEPENDENT_AMBULATORY_CARE_PROVIDER_SITE_OTHER): Payer: Medicare Other | Admitting: Internal Medicine

## 2013-07-10 VITALS — BP 126/68 | HR 71 | Temp 98.3°F | Resp 16 | Wt 88.6 lb

## 2013-07-10 DIAGNOSIS — J449 Chronic obstructive pulmonary disease, unspecified: Secondary | ICD-10-CM | POA: Diagnosis not present

## 2013-07-10 DIAGNOSIS — K5901 Slow transit constipation: Secondary | ICD-10-CM | POA: Diagnosis not present

## 2013-07-10 DIAGNOSIS — K644 Residual hemorrhoidal skin tags: Secondary | ICD-10-CM | POA: Diagnosis not present

## 2013-07-10 DIAGNOSIS — I10 Essential (primary) hypertension: Secondary | ICD-10-CM

## 2013-07-10 DIAGNOSIS — G309 Alzheimer's disease, unspecified: Secondary | ICD-10-CM

## 2013-07-10 DIAGNOSIS — J4489 Other specified chronic obstructive pulmonary disease: Secondary | ICD-10-CM

## 2013-07-10 DIAGNOSIS — E119 Type 2 diabetes mellitus without complications: Secondary | ICD-10-CM | POA: Diagnosis not present

## 2013-07-10 DIAGNOSIS — F0280 Dementia in other diseases classified elsewhere without behavioral disturbance: Secondary | ICD-10-CM

## 2013-07-10 MED ORDER — MEMANTINE HCL ER 28 MG PO CP24: 28.0000 mg | ORAL_CAPSULE | Freq: Every day | ORAL | Status: DC

## 2013-07-10 MED ORDER — MEMANTINE HCL ER 7 & 14 & 21 &28 MG PO CP24: ORAL_CAPSULE | ORAL | Status: DC

## 2013-07-10 NOTE — Progress Notes (Signed)
Patient ID: Bianca Khan, female   DOB: Mar 26, 1915, 80 y.o.   MRN: [id number]   Location:  [facility]  Code Status: partial code  No Known Allergies  Chief Complaint  Patient presents with  . Medical Managment of Chronic Issues    6 months f/u with labs printed    HPI: Patient is a 80 y.o. white female seen in the office today for med mgt chronic diseases including memory loss, hemorrhoids, COPD, anemia, chronic leukocytosis, CHF.  Hemorrhoids have been acting up.  Otherwise doing ok.  Continued memory loss evident during hpi and ros.    MMSE done today with 25/30 failing clock drawing.    Review of Systems:  Review of Systems  Constitutional: Negative for malaise/fatigue.  HENT: Negative for congestion.   Eyes: Negative for blurred vision.  Respiratory: Negative for shortness of breath.   Cardiovascular: Negative for chest pain and leg swelling.  Gastrointestinal: Negative for abdominal pain, constipation, blood in stool and melena.       Hemorrhoids external  Genitourinary: Positive for urgency. Negative for dysuria.       Smells of urinary incontinence today, but denies  Musculoskeletal: Negative for falls and myalgias.  Skin: Negative for rash.  Neurological: Negative for dizziness and loss of consciousness.  Endo/Heme/Allergies: Bruises/bleeds easily.  Psychiatric/Behavioral: Positive for depression and memory loss. The patient does not have insomnia.        Denies memory loss but has no recall between visits and even within a visit, daughter does not back me up about memory loss problem     Past Medical History  Diagnosis Date  . COPD (chronic obstructive pulmonary disease)   . CHF (congestive heart failure)   . Edema   . Myocardial infarction   . Anxiety   . Hypertension   . Anemia   . Leukocytosis   . Acid reflux   . Osteoporosis   . Sinus drainage   . Weakness   . Unspecified intestinal obstruction   . Unspecified  constipation   . Type II or unspecified type diabetes mellitus without mention of complication, not stated as uncontrolled   . Solitary pulmonary nodule   . Other abnormal blood chemistry   . Acute bronchitis   . Adult failure to thrive   . Loss of weight   . Unspecified fall   . Insomnia, unspecified   . Insomnia, unspecified   . Open wound of scalp, without mention of complication   . Atrial fibrillation   . Edema   . Carbuncle and furuncle of other specified sites   . Personal history of methicillin resistant Staphylococcus aureus   . Impacted cerumen   . Chronic airway obstruction, not elsewhere classified   . Coronary atherosclerosis of native coronary artery   . Leukocytosis, unspecified   . Anxiety state, unspecified   . Depressive disorder, not elsewhere classified   . Unspecified essential hypertension   . Cardiomegaly   . Unspecified pleural effusion   . Unspecified pleural effusion   . Esophagitis, unspecified   . Reflux esophagitis   . Duodenal ulcer, unspecified as acute or chronic, without hemorrhage, perforation, or obstruction   . Diaphragmatic hernia without mention of obstruction or gangrene   . Senile osteoporosis   . Abnormal involuntary movements(781.0)   . Abnormality of gait   . Loss of weight   . Other dyspnea and respiratory abnormality     Past Surgical History  Procedure Laterality Date  .  Laparotomy  04/04/2012    Procedure: EXPLORATORY LAPAROTOMY;  Surgeon: Adin Hector, MD;  Location: [place];  Service: General;  Laterality: N/A;  . Abdominal hysterectomy  1991  . Hip surgery    . Fracture surgery  2011    hip    Social History:   reports that she has never smoked. She has never used smokeless tobacco. She reports that she drinks alcohol. She reports that she does not use illicit drugs.  Family History  Problem Relation Age of Onset  . Stroke Father     Medications: Patient's Medications  New Prescriptions   MEMANTINE HCL ER 28  MG CP24    Take 28 mg by mouth daily.   MEMANTINE HCL ER 7 & 14 & 21 &28 MG CP24    Take one capsule daily per titration pack  Previous Medications   ACETAMINOPHEN (TYLENOL) 325 MG TABLET    Take 650 mg by mouth every 4 (four) hours as needed. Headache or pain   ALBUTEROL (PROAIR HFA) 108 (90 BASE) MCG/ACT INHALER    Inhale 2 puffs into the lungs every 6 (six) hours as needed for wheezing or shortness of breath.   ALENDRONATE (FOSAMAX) 70 MG TABLET    TAKE 1 TABLET WEEKLY WITH 8 OUNCES OF WATER AND STAY UPRIGHT FOR 30 MINUTES AFTERWARD   ASPIRIN 81 MG TABLET    Take 81 mg by mouth daily.   BARRIER CREAM (NON-SPECIFIED) CREA    Apply 1 application topically 2 (two) times daily as needed. Moister barrier cream  Apply to buttock as needed for redness   CALCIUM-VITAMIN D-VITAMIN K (VIACTIV PO)    Take 1 each by mouth 2 (two) times daily.    DICLOFENAC SODIUM (VOLTAREN) 1 % GEL    Apply 2 g topically every 8 (eight) hours as needed. For joint pain   DILTIAZEM (CARDIZEM CD) 240 MG 24 HR CAPSULE    TAKE 1 CAPSULE DAILY FOR HEART RHYTHM   DOCUSATE SODIUM (COLACE) 100 MG CAPSULE    Take one tablet once daily   FEEDING SUPPLEMENT (RESOURCE BREEZE) LIQD    Take 1 Container by mouth 2 (two) times daily between meals.   GUAIFENESIN (MUCINEX) 600 MG 12 HR TABLET    Take 1 tablet (600 mg total) by mouth daily after breakfast.   HYDROCHLOROTHIAZIDE (HYDRODIURIL) 25 MG TABLET    TAKE 1 TABLET DAILY FOR EDEMA   HYDROCORTISONE (ANUSOL-HC) 2.5 % RECTAL CREAM    Place rectally 2 (two) times daily. For hemorrhoids   HYDROCORTISONE (ANUSOL-HC) 25 MG SUPPOSITORY    Place 1 suppository (25 mg total) rectally daily as needed for hemorrhoids.   MENTHOL-CETYLPYRIDINIUM (CEPACOL) 3 MG LOZENGE    Take 1 lozenge by mouth every 4 (four) hours as needed. For sore throat   MIRTAZAPINE (REMERON) 15 MG TABLET    TAKE 1 TABLET AT BEDTIME   OMEPRAZOLE (PRILOSEC) 20 MG CAPSULE    TAKE 1 CAPSULE DAILY TO REDUCE STOMACH ACID AND TO HELP  PROTECT ESOPHAGUS   ONDANSETRON (ZOFRAN) 4 MG TABLET    Take one tablet by mouth every 6 hours as needed for nausea   POLYETHYLENE GLYCOL (MIRALAX / GLYCOLAX) PACKET    Take 17 g by mouth daily as needed. constipation   PROTEIN (PROCEL) POWD    Take by mouth 2 (two) times daily.   PROTEIN SUPPLEMENT (RESOURCE BENEPROTEIN) POWD    Take 1 scoop by mouth 2 (two) times daily with a meal.   SENNA-DOCUSATE (  SENOKOT-S) 8.6-50 MG PER TABLET    Take 1 tablet by mouth daily as needed for constipation. For constipation   SPIRIVA HANDIHALER 18 MCG INHALATION CAPSULE    INHALE THE CONTENTS OF 1 CAPSULE ONCE DAILY   VITAMIN D, ERGOCALCIFEROL, (DRISDOL) 50000 UNITS CAPS CAPSULE    TAKE 1 CAPSULE ONCE A WEEK FOR VITAMIN D DEFICIENCY  Modified Medications   No medications on file  Discontinued Medications   METFORMIN (GLUCOPHAGE) 500 MG TABLET    Take 1 tablet (500 mg total) by mouth daily with breakfast.     Physical Exam: Filed Vitals:   07/10/13 1348  BP: 126/68  Pulse: 71  Temp: 98.3 F (36.8 C)  Resp: 16  Weight: 88 lb 9.6 oz (40.189 kg)  SpO2: 96%   Physical Exam  Constitutional: She appears well-developed and well-nourished. No distress.  Thin white female  Cardiovascular: Normal rate, regular rhythm, normal heart sounds and intact distal pulses.   Pulmonary/Chest: Effort normal and breath sounds normal.  Musculoskeletal: Normal range of motion. She exhibits no edema and no tenderness.  Neurological: She is alert.  Oriented except says she is at the hospital  Skin: Skin is warm and dry.     Labs reviewed: Basic Metabolic Panel:  Recent Labs  09/23/12 1246 07/08/13 0840  NA 143 145*  K 4.7 4.7  CL 99 99  CO2 31* 29  GLUCOSE 101* 131*  BUN 25 20  CREATININE 1.05* 1.00  CALCIUM 10.7* 11.2*   Liver Function Tests:  Recent Labs  07/08/13 0840  AST 16  ALT 8  ALKPHOS 93  BILITOT 0.4  PROT 6.8  CBC:  Recent Labs  09/23/12 1246 07/08/13 0840  WBC 15.8* 15.8*    NEUTROABS 8.5* 7.7*  HGB 13.4 14.1  HCT 40.7 40.8  MCV 91 91    Lab Results  Component Value Date   HGBA1C 6.1* 09/23/2012     Assessment/Plan 1. Constipation, slow transit -controlled with current regimen--pt claims she gets it every other day, but only needs weekly, but it is ordered daily prn so she can get it only when she wants it  2. External hemorrhoids without complication -advised to ask for hemorrhoid cream or suppository if desired  3. COPD (chronic obstructive pulmonary disease) -well controlled, mild  4. Diabetes mellitus -very well controlled, frail elderly female so will stop metformin  5. Hypertension -bp controlled, no changes needed  6. Alzheimer's disease - advised to start namenda but pt insists she doesn't have a memory problem and her daughter is not helping me and says pt did not understand directions to draw clock (I don't understand how that could be misunderstood)--has mild dementia with very poor short term memory - Memantine HCl ER 7 & 14 & 21 &28 MG CP24; Take one capsule daily per titration pack  Dispense: 28 capsule; Refill: 0 - Memantine HCl ER 28 MG CP24; Take 28 mg by mouth daily.  Dispense: 30 capsule; Refill: 3  Labs/tests ordered: no orders today Next appt:  3 mos

## 2013-07-10 NOTE — Progress Notes (Signed)
Failed clock drawing  

## 2013-07-25 ENCOUNTER — Other Ambulatory Visit: Payer: Self-pay | Admitting: Internal Medicine

## 2013-07-25 NOTE — Telephone Encounter (Signed)
rx for Vitamin d-3 received.   Never prescribed by [place]. Is it okay to refill? Please advise/ thanks

## 2013-07-25 NOTE — Telephone Encounter (Signed)
rx filled

## 2013-07-29 ENCOUNTER — Encounter: Payer: Self-pay | Admitting: Internal Medicine

## 2013-07-29 ENCOUNTER — Ambulatory Visit: Payer: Medicare Other

## 2013-08-05 ENCOUNTER — Encounter: Payer: Self-pay | Admitting: Internal Medicine

## 2013-09-08 ENCOUNTER — Other Ambulatory Visit: Payer: Self-pay | Admitting: Nurse Practitioner

## 2013-10-06 ENCOUNTER — Encounter: Payer: Self-pay | Admitting: Internal Medicine

## 2013-10-06 ENCOUNTER — Ambulatory Visit (INDEPENDENT_AMBULATORY_CARE_PROVIDER_SITE_OTHER): Payer: Medicare Other | Admitting: Internal Medicine

## 2013-10-06 VITALS — BP 120/76 | HR 53 | Temp 97.8°F | Resp 10 | Wt 87.0 lb

## 2013-10-06 DIAGNOSIS — F0280 Dementia in other diseases classified elsewhere without behavioral disturbance: Secondary | ICD-10-CM

## 2013-10-06 DIAGNOSIS — G250 Essential tremor: Secondary | ICD-10-CM

## 2013-10-06 DIAGNOSIS — R739 Hyperglycemia, unspecified: Secondary | ICD-10-CM

## 2013-10-06 DIAGNOSIS — G252 Other specified forms of tremor: Secondary | ICD-10-CM | POA: Diagnosis not present

## 2013-10-06 DIAGNOSIS — R7309 Other abnormal glucose: Secondary | ICD-10-CM

## 2013-10-06 DIAGNOSIS — J329 Chronic sinusitis, unspecified: Secondary | ICD-10-CM | POA: Diagnosis not present

## 2013-10-06 DIAGNOSIS — G309 Alzheimer's disease, unspecified: Secondary | ICD-10-CM | POA: Diagnosis not present

## 2013-10-06 DIAGNOSIS — R609 Edema, unspecified: Secondary | ICD-10-CM | POA: Insufficient documentation

## 2013-10-06 DIAGNOSIS — J449 Chronic obstructive pulmonary disease, unspecified: Secondary | ICD-10-CM

## 2013-10-06 DIAGNOSIS — K5901 Slow transit constipation: Secondary | ICD-10-CM

## 2013-10-06 DIAGNOSIS — J4489 Other specified chronic obstructive pulmonary disease: Secondary | ICD-10-CM | POA: Diagnosis not present

## 2013-10-06 NOTE — Progress Notes (Signed)
Patient ID: Bianca Khan, female   DOB: 1914-07-11, 80 y.o.   MRN: [id number]   Location:  [facility]  Code Status: DNR  No Known Allergies  Chief Complaint  Patient presents with  . Medical Managment of Chronic Issues    3 month follow-up, no recent labs  . Numbness    Leg numbness every night x several months     HPI: Patient is a 80 y.o. white female seen in the office today for medical mgt of chronic diseases.    Says when she takes her stockings off at night, her feet and legs are numb and tingly, but she still falls right off to sleep.  They don't bother her when her shoes and stocking are on like right now.  Gets minimal swelling at tops of feet some days after she is up more.    Review of Systems:  Review of Systems  Constitutional: Positive for weight loss. Negative for malaise/fatigue.  HENT: Negative for congestion.   Eyes: Negative for blurred vision.  Respiratory: Negative for cough, shortness of breath and wheezing.   Cardiovascular: Positive for leg swelling. Negative for chest pain and palpitations.  Gastrointestinal: Negative for abdominal pain, constipation, blood in stool and melena.  Genitourinary: Negative for dysuria.  Musculoskeletal: Negative for falls and myalgias.  Skin: Negative for rash.  Neurological: Positive for tremors. Negative for weakness and headaches.  Endo/Heme/Allergies: Bruises/bleeds easily.  Psychiatric/Behavioral: Positive for memory loss. Negative for depression. The patient is nervous/anxious.      Past Medical History  Diagnosis Date  . COPD (chronic obstructive pulmonary disease)   . CHF (congestive heart failure)   . Edema   . Myocardial infarction   . Anxiety   . Hypertension   . Anemia   . Leukocytosis   . Acid reflux   . Osteoporosis   . Sinus drainage   . Weakness   . Unspecified intestinal obstruction   . Unspecified constipation   . Type II or unspecified type diabetes  mellitus without mention of complication, not stated as uncontrolled   . Solitary pulmonary nodule   . Other abnormal blood chemistry   . Acute bronchitis   . Adult failure to thrive   . Loss of weight   . Unspecified fall   . Insomnia, unspecified   . Insomnia, unspecified   . Open wound of scalp, without mention of complication   . Atrial fibrillation   . Edema   . Carbuncle and furuncle of other specified sites   . Personal history of methicillin resistant Staphylococcus aureus   . Impacted cerumen   . Chronic airway obstruction, not elsewhere classified   . Coronary atherosclerosis of native coronary artery   . Leukocytosis, unspecified   . Anxiety state, unspecified   . Depressive disorder, not elsewhere classified   . Unspecified essential hypertension   . Cardiomegaly   . Unspecified pleural effusion   . Unspecified pleural effusion   . Esophagitis, unspecified   . Reflux esophagitis   . Duodenal ulcer, unspecified as acute or chronic, without hemorrhage, perforation, or obstruction   . Diaphragmatic hernia without mention of obstruction or gangrene   . Senile osteoporosis   . Abnormal involuntary movements(781.0)   . Abnormality of gait   . Loss of weight   . Other dyspnea and respiratory abnormality     Past Surgical History  Procedure Laterality Date  . Laparotomy  04/04/2012    Procedure: EXPLORATORY LAPAROTOMY;  Surgeon: Adin Hector, MD;  Location: [place];  Service: General;  Laterality: N/A;  . Abdominal hysterectomy  1991  . Hip surgery    . Fracture surgery  2011    hip    Social History:   reports that she has never smoked. She has never used smokeless tobacco. She reports that she drinks alcohol. She reports that she does not use illicit drugs.  Family History  Problem Relation Age of Onset  . Stroke Father     Medications: Patient's Medications  New Prescriptions   No medications on file  Previous Medications   ACETAMINOPHEN (TYLENOL) 325  MG TABLET    Take 650 mg by mouth every 4 (four) hours as needed. Headache or pain   ALBUTEROL (PROAIR HFA) 108 (90 BASE) MCG/ACT INHALER    Inhale 2 puffs into the lungs every 6 (six) hours as needed for wheezing or shortness of breath.   ALENDRONATE (FOSAMAX) 70 MG TABLET    TAKE 1 TABLET WEEKLY WITH 8 OUNCES OF WATER AND STAY UPRIGHT FOR 30 MINUTES AFTERWARD   ASPIRIN 81 MG TABLET    Take 81 mg by mouth daily.   BARRIER CREAM (NON-SPECIFIED) CREA    Apply 1 application topically 2 (two) times daily as needed. Moister barrier cream  Apply to buttock as needed for redness   CALCIUM-VITAMIN D-VITAMIN K (VIACTIV PO)    Take 1 each by mouth 2 (two) times daily.    DICLOFENAC SODIUM (VOLTAREN) 1 % GEL    Apply 2 g topically every 8 (eight) hours as needed. For joint pain   DILTIAZEM (CARDIZEM CD) 240 MG 24 HR CAPSULE    TAKE 1 CAPSULE DAILY FOR HEART RHYTHM   DOCUSATE SODIUM (COLACE) 100 MG CAPSULE    Take one tablet once daily   FEEDING SUPPLEMENT (RESOURCE BREEZE) LIQD    Take 1 Container by mouth 2 (two) times daily between meals.   GUAIFENESIN (MUCINEX) 600 MG 12 HR TABLET    Take 1 tablet (600 mg total) by mouth daily after breakfast.   HYDROCHLOROTHIAZIDE (HYDRODIURIL) 25 MG TABLET    TAKE 1 TABLET DAILY FOR EDEMA   HYDROCORTISONE (ANUSOL-HC) 2.5 % RECTAL CREAM    Place rectally 2 (two) times daily. For hemorrhoids   HYDROCORTISONE (ANUSOL-HC) 25 MG SUPPOSITORY    Place 1 suppository (25 mg total) rectally daily as needed for hemorrhoids.   MENTHOL-CETYLPYRIDINIUM (CEPACOL) 3 MG LOZENGE    Take 1 lozenge by mouth every 4 (four) hours as needed. For sore throat   MIRTAZAPINE (REMERON) 15 MG TABLET    TAKE 1 TABLET AT BEDTIME   OMEPRAZOLE (PRILOSEC) 20 MG CAPSULE    TAKE 1 CAPSULE DAILY TO REDUCE STOMACH ACID AND TO HELP PROTECT ESOPHAGUS   ONDANSETRON (ZOFRAN) 4 MG TABLET    Take one tablet by mouth every 6 hours as needed for nausea   POLYETHYLENE GLYCOL (MIRALAX / GLYCOLAX) PACKET    Take 17 g  by mouth daily as needed. constipation   PROTEIN (PROCEL) POWD    Take by mouth 2 (two) times daily.   PROTEIN SUPPLEMENT (RESOURCE BENEPROTEIN) POWD    Take 1 scoop by mouth 2 (two) times daily with a meal.   SENNA-DOCUSATE (SENOKOT-S) 8.6-50 MG PER TABLET    Take 1 tablet by mouth daily as needed for constipation. For constipation   SPIRIVA HANDIHALER 18 MCG INHALATION CAPSULE    INHALE THE CONTENTS OF 1 CAPSULE ONCE DAILY   VITAMIN D, ERGOCALCIFEROL, (  DRISDOL) 50000 UNITS CAPS CAPSULE    TAKE 1 CAPSULE ONCE A WEEK FOR VITAMIN D DEFICIENCY  Modified Medications   No medications on file  Discontinued Medications   MEMANTINE HCL ER 28 MG CP24    Take 28 mg by mouth daily.   MEMANTINE HCL ER 7 & 14 & 21 &28 MG CP24    Take one capsule daily per titration pack     Physical Exam: Filed Vitals:   10/06/13 1319  BP: 120/76  Pulse: 53  Temp: 97.8 F (36.6 C)  TempSrc: Oral  Resp: 10  Weight: 87 lb (39.463 kg)  SpO2: 98%  Physical Exam  Constitutional:  Frail white female   HENT:  Head: Normocephalic and atraumatic.  Cardiovascular: Normal rate, regular rhythm, normal heart sounds and intact distal pulses.   Pulmonary/Chest: Effort normal and breath sounds normal. No respiratory distress. She has no wheezes.  Abdominal: Soft. Bowel sounds are normal. She exhibits no distension and no mass. There is no tenderness.  Musculoskeletal: Normal range of motion.  Neurological: She is alert.  Oriented to person and place, not exact date; sensation intact in feet to light touch and proprioception  Skin: Skin is warm and dry.  Psychiatric: She has a normal mood and affect.   Labs reviewed: Basic Metabolic Panel:  Recent Labs  07/08/13 0840  NA 145*  K 4.7  CL 99  CO2 29  GLUCOSE 131*  BUN 20  CREATININE 1.00  CALCIUM 11.2*   Liver Function Tests:  Recent Labs  07/08/13 0840  AST 16  ALT 8  ALKPHOS 93  BILITOT 0.4  PROT 6.8  CBC:  Recent Labs  07/08/13 0840  WBC  15.8*  NEUTROABS 7.7*  HGB 14.1  HCT 40.8  MCV 91   Lab Results  Component Value Date   HGBA1C 6.1* 09/23/2012    Assessment/Plan 1. Alzheimer's disease -lives at [facility] due to this -no changes in therapy needed -has mild dementia--requires help with meds and appts, but is pretty much independent otherwise  2. COPD (chronic obstructive pulmonary disease) -stable, no need for inhalers lately  3. Essential tremor -worse lately, bp would not tolerate addition of propanolol for this and does not bother her enough for intervention  4. Sinusitis, chronic -no changes needed--doing well right now  5. Constipation, slow transit -cont senna s and miralax  6. Edema Stable with use of hctz and elevation of legs  Labs/tests ordered:   Orders Placed This Encounter  Procedures  . CBC With differential/Platelet    Standing Status: Future     Number of Occurrences:      Standing Expiration Date: 04/07/2014  . Comprehensive metabolic panel    Standing Status: Future     Number of Occurrences:      Standing Expiration Date: 04/07/2014  . Hemoglobin A1c    Standing Status: Future     Number of Occurrences:      Standing Expiration Date: 04/07/2014    Next appt:  3 mos

## 2013-10-30 ENCOUNTER — Other Ambulatory Visit: Payer: Self-pay

## 2013-10-30 DIAGNOSIS — K649 Unspecified hemorrhoids: Secondary | ICD-10-CM

## 2013-10-30 MED ORDER — HYDROCORTISONE 2.5 % RE CREA: TOPICAL_CREAM | Freq: Two times a day (BID) | RECTAL | Status: DC

## 2013-10-30 NOTE — Telephone Encounter (Signed)
Rx printed and faxed to [facility] @ [phone], phone number is [phone]

## 2013-11-07 ENCOUNTER — Other Ambulatory Visit: Payer: Self-pay | Admitting: Internal Medicine

## 2013-12-14 ENCOUNTER — Other Ambulatory Visit: Payer: Self-pay | Admitting: Internal Medicine

## 2014-01-13 ENCOUNTER — Other Ambulatory Visit: Payer: Medicare Other

## 2014-01-13 DIAGNOSIS — K5901 Slow transit constipation: Secondary | ICD-10-CM

## 2014-01-13 DIAGNOSIS — G309 Alzheimer's disease, unspecified: Principal | ICD-10-CM

## 2014-01-13 DIAGNOSIS — R7309 Other abnormal glucose: Secondary | ICD-10-CM | POA: Diagnosis not present

## 2014-01-13 DIAGNOSIS — R739 Hyperglycemia, unspecified: Secondary | ICD-10-CM

## 2014-01-13 DIAGNOSIS — F0280 Dementia in other diseases classified elsewhere without behavioral disturbance: Secondary | ICD-10-CM | POA: Diagnosis not present

## 2014-01-13 DIAGNOSIS — R609 Edema, unspecified: Secondary | ICD-10-CM

## 2014-01-14 ENCOUNTER — Encounter: Payer: Self-pay | Admitting: *Deleted

## 2014-01-14 LAB — CBC WITH DIFFERENTIAL
Basophils Absolute: 0.1 10*3/uL (ref 0.0–0.2)
Basos: 0 %
Eos: 11 %
Eosinophils Absolute: 1.7 10*3/uL — ABNORMAL HIGH (ref 0.0–0.4)
HCT: 41.6 % (ref 34.0–46.6)
Hemoglobin: 14.3 g/dL (ref 11.1–15.9)
Immature Grans (Abs): 0 10*3/uL (ref 0.0–0.1)
Immature Granulocytes: 0 %
Lymphocytes Absolute: 3.5 10*3/uL — ABNORMAL HIGH (ref 0.7–3.1)
Lymphs: 22 %
MCH: 30.6 pg (ref 26.6–33.0)
MCHC: 34.4 g/dL (ref 31.5–35.7)
MCV: 89 fL (ref 79–97)
Monocytes Absolute: 1.6 10*3/uL — ABNORMAL HIGH (ref 0.1–0.9)
Monocytes: 10 %
Neutrophils Absolute: 9.1 10*3/uL — ABNORMAL HIGH (ref 1.4–7.0)
Neutrophils Relative %: 57 %
Platelets: 468 10*3/uL — ABNORMAL HIGH (ref 150–379)
RBC: 4.67 x10E6/uL (ref 3.77–5.28)
RDW: 12.8 % (ref 12.3–15.4)
WBC: 16 10*3/uL — ABNORMAL HIGH (ref 3.4–10.8)

## 2014-01-14 LAB — COMPREHENSIVE METABOLIC PANEL
ALT: 10 IU/L (ref 0–32)
AST: 16 IU/L (ref 0–40)
Albumin/Globulin Ratio: 2 (ref 1.1–2.5)
Albumin: 4.3 g/dL (ref 3.2–4.6)
Alkaline Phosphatase: 83 IU/L (ref 39–117)
BUN/Creatinine Ratio: 21 (ref 11–26)
BUN: 20 mg/dL (ref 10–36)
CO2: 26 mmol/L (ref 18–29)
Calcium: 10.4 mg/dL — ABNORMAL HIGH (ref 8.7–10.3)
Chloride: 97 mmol/L (ref 97–108)
Creatinine, Ser: 0.94 mg/dL (ref 0.57–1.00)
GFR calc Af Amer: 59 mL/min/{1.73_m2} — ABNORMAL LOW (ref >59)
GFR calc non Af Amer: 51 mL/min/{1.73_m2} — ABNORMAL LOW (ref >59)
Globulin, Total: 2.1 g/dL (ref 1.5–4.5)
Glucose: 136 mg/dL — ABNORMAL HIGH (ref 65–99)
Potassium: 3.8 mmol/L (ref 3.5–5.2)
Sodium: 143 mmol/L (ref 134–144)
Total Bilirubin: 0.4 mg/dL (ref 0.0–1.2)
Total Protein: 6.4 g/dL (ref 6.0–8.5)

## 2014-01-14 LAB — HEMOGLOBIN A1C
Est. average glucose Bld gHb Est-mCnc: 137 mg/dL
Hgb A1c MFr Bld: 6.4 % — ABNORMAL HIGH (ref 4.8–5.6)

## 2014-01-15 ENCOUNTER — Ambulatory Visit (INDEPENDENT_AMBULATORY_CARE_PROVIDER_SITE_OTHER): Payer: Medicare Other | Admitting: Internal Medicine

## 2014-01-15 ENCOUNTER — Encounter: Payer: Self-pay | Admitting: Internal Medicine

## 2014-01-15 VITALS — BP 122/70 | HR 71 | Temp 98.2°F | Ht 61.0 in | Wt 85.0 lb

## 2014-01-15 DIAGNOSIS — Z23 Encounter for immunization: Secondary | ICD-10-CM

## 2014-01-15 DIAGNOSIS — E1149 Type 2 diabetes mellitus with other diabetic neurological complication: Secondary | ICD-10-CM | POA: Diagnosis not present

## 2014-01-15 DIAGNOSIS — F0280 Dementia in other diseases classified elsewhere without behavioral disturbance: Secondary | ICD-10-CM | POA: Diagnosis not present

## 2014-01-15 DIAGNOSIS — J439 Emphysema, unspecified: Secondary | ICD-10-CM | POA: Insufficient documentation

## 2014-01-15 DIAGNOSIS — K5901 Slow transit constipation: Secondary | ICD-10-CM

## 2014-01-15 DIAGNOSIS — J321 Chronic frontal sinusitis: Secondary | ICD-10-CM | POA: Insufficient documentation

## 2014-01-15 DIAGNOSIS — K649 Unspecified hemorrhoids: Secondary | ICD-10-CM

## 2014-01-15 DIAGNOSIS — E1142 Type 2 diabetes mellitus with diabetic polyneuropathy: Secondary | ICD-10-CM | POA: Insufficient documentation

## 2014-01-15 DIAGNOSIS — K640 First degree hemorrhoids: Secondary | ICD-10-CM | POA: Insufficient documentation

## 2014-01-15 DIAGNOSIS — J438 Other emphysema: Secondary | ICD-10-CM

## 2014-01-15 DIAGNOSIS — G309 Alzheimer's disease, unspecified: Secondary | ICD-10-CM

## 2014-01-15 MED ORDER — ZOSTER VACCINE LIVE 19400 UNT/0.65ML ~~LOC~~ SOLR: 0.6500 mL | Freq: Once | SUBCUTANEOUS | Status: DC

## 2014-01-15 NOTE — Progress Notes (Signed)
Patient ID: Bianca Khan, female   DOB: [birth year], 80 y.o.   MRN: [id number]   Location:  [facility] / [facility]  Code Status: DNR, has advance directive--living will and hcpoa  No Known Allergies  Chief Complaint  Patient presents with  . Annual Exam    Physical & discuss labs (printed)  . other    anxiety issues and would like to discuss shinlges vaccine    HPI: Patient is a 80 y.o. white female seen in the office today for med mgt chronic diseases.  This is her annual exam.    Nothing new.  Getting along pretty good.  Says she feels nervous if something comes up.  Will then calm down.    Discussed shingles vaccine.    Has nerve pain, numbness in feet when she gets ready for bed after taking off her shoes.  Goes away when she gets in the bed.  Review of Systems:  Review of Systems  Constitutional: Negative for fever and chills.  HENT: Positive for hearing loss. Negative for sore throat.   Eyes: Negative for blurred vision.  Respiratory: Negative for cough and shortness of breath.   Cardiovascular: Negative for chest pain and leg swelling.  Gastrointestinal: Negative for heartburn, diarrhea, constipation, blood in stool and melena.  Genitourinary: Negative for dysuria, urgency and frequency.  Musculoskeletal: Negative for back pain, falls, joint pain and myalgias.  Skin: Negative for itching and rash.  Neurological: Negative for weakness and headaches.  Psychiatric/Behavioral: Positive for memory loss.   Past Medical History  Diagnosis Date  . COPD (chronic obstructive pulmonary disease)   . CHF (congestive heart failure)   . Edema   . Myocardial infarction   . Anxiety   . Hypertension   . Anemia   . Leukocytosis   . Acid reflux   . Osteoporosis   . Sinus drainage   . Weakness   . Unspecified intestinal obstruction   . Unspecified constipation   . Type II or unspecified type diabetes mellitus without mention of complication, not  stated as uncontrolled   . Solitary pulmonary nodule   . Other abnormal blood chemistry   . Acute bronchitis   . Adult failure to thrive   . Loss of weight   . Unspecified fall   . Insomnia, unspecified   . Insomnia, unspecified   . Open wound of scalp, without mention of complication   . Atrial fibrillation   . Edema   . Carbuncle and furuncle of other specified sites   . Personal history of methicillin resistant Staphylococcus aureus   . Impacted cerumen   . Chronic airway obstruction, not elsewhere classified   . Coronary atherosclerosis of native coronary artery   . Leukocytosis, unspecified   . Anxiety state, unspecified   . Depressive disorder, not elsewhere classified   . Unspecified essential hypertension   . Cardiomegaly   . Unspecified pleural effusion   . Unspecified pleural effusion   . Esophagitis, unspecified   . Reflux esophagitis   . Duodenal ulcer, unspecified as acute or chronic, without hemorrhage, perforation, or obstruction   . Diaphragmatic hernia without mention of obstruction or gangrene   . Senile osteoporosis   . Abnormal involuntary movements(781.0)   . Abnormality of gait   . Loss of weight   . Other dyspnea and respiratory abnormality     Past Surgical History  Procedure Laterality Date  . Laparotomy  04/04/2012    Procedure: EXPLORATORY LAPAROTOMY;  Surgeon:  Adin Hector, MD;  Location: [place];  Service: General;  Laterality: N/A;  . Abdominal hysterectomy  1991  . Hip surgery    . Fracture surgery  2011    hip    Social History:   reports that she has never smoked. She has never used smokeless tobacco. She reports that she drinks alcohol. She reports that she does not use illicit drugs.  Family History  Problem Relation Age of Onset  . Stroke Father     Medications: Patient's Medications  New Prescriptions   ZOSTER VACCINE LIVE, PF, (ZOSTAVAX) 76195 UNT/0.65ML INJECTION    Inject 19,400 Units into the skin once.  Previous  Medications   ACETAMINOPHEN (TYLENOL) 325 MG TABLET    Take 650 mg by mouth every 4 (four) hours as needed. Headache or pain   ALBUTEROL (PROAIR HFA) 108 (90 BASE) MCG/ACT INHALER    Inhale 2 puffs into the lungs every 6 (six) hours as needed for wheezing or shortness of breath.   ALENDRONATE (FOSAMAX) 70 MG TABLET    TAKE 1 TABLET WEEKLY WITH 8 OUNCES OF WATER AND STAY UPRIGHT FOR 30 MINUTES AFTERWARD   ASPIRIN 81 MG TABLET    Take 81 mg by mouth daily.   BARRIER CREAM (NON-SPECIFIED) CREA    Apply 1 application topically 2 (two) times daily as needed. Moister barrier cream  Apply to buttock as needed for redness   CALCIUM-VITAMIN D-VITAMIN K (VIACTIV PO)    Take 1 each by mouth 2 (two) times daily.    CARTIA XT 240 MG 24 HR CAPSULE    TAKE 1 CAPSULE DAILY FOR HEART RHYTHM   DICLOFENAC SODIUM (VOLTAREN) 1 % GEL    Apply 2 g topically every 8 (eight) hours as needed. For joint pain   DOCUSATE SODIUM (COLACE) 100 MG CAPSULE    Take one tablet once daily   FEEDING SUPPLEMENT (RESOURCE BREEZE) LIQD    Take 1 Container by mouth 2 (two) times daily between meals.   GUAIFENESIN (MUCINEX) 600 MG 12 HR TABLET    Take 1 tablet (600 mg total) by mouth daily after breakfast.   HYDROCHLOROTHIAZIDE (HYDRODIURIL) 25 MG TABLET    TAKE 1 TABLET DAILY FOR EDEMA   HYDROCORTISONE (ANUSOL-HC) 2.5 % RECTAL CREAM    Place rectally 2 (two) times daily. For hemorrhoids   HYDROCORTISONE (ANUSOL-HC) 25 MG SUPPOSITORY    Place 1 suppository (25 mg total) rectally daily as needed for hemorrhoids.   MENTHOL-CETYLPYRIDINIUM (CEPACOL) 3 MG LOZENGE    Take 1 lozenge by mouth every 4 (four) hours as needed. For sore throat   MIRTAZAPINE (REMERON) 15 MG TABLET    TAKE 1 TABLET AT BEDTIME   OMEPRAZOLE (PRILOSEC) 20 MG CAPSULE    TAKE 1 CAPSULE DAILY TO REDUCE STOMACH ACID AND TO HELP PROTECT ESOPHAGUS   ONDANSETRON (ZOFRAN) 4 MG TABLET    Take one tablet by mouth every 6 hours as needed for nausea   POLYETHYLENE GLYCOL (MIRALAX /  GLYCOLAX) PACKET    Take 17 g by mouth daily as needed. constipation   PROTEIN (PROCEL) POWD    Take by mouth 2 (two) times daily.   PROTEIN SUPPLEMENT (RESOURCE BENEPROTEIN) POWD    Take 1 scoop by mouth 2 (two) times daily with a meal.   SENNA-DOCUSATE (SENOKOT-S) 8.6-50 MG PER TABLET    Take 1 tablet by mouth daily as needed for constipation. For constipation   SPIRIVA HANDIHALER 18 MCG INHALATION CAPSULE    INHALE  THE CONTENTS OF 1 CAPSULE ONCE DAILY   VITAMIN D, ERGOCALCIFEROL, (DRISDOL) 50000 UNITS CAPS CAPSULE    TAKE 1 CAPSULE ONCE A WEEK FOR VITAMIN D DEFICIENCY  Modified Medications   No medications on file  Discontinued Medications   No medications on file   Physical Exam: Filed Vitals:   01/15/14 1626  BP: 122/70  Pulse: 71  Temp: 98.2 F (36.8 C)  TempSrc: Oral  Height: 5\' 1"  (1.549 m)  Weight: 85 lb (38.556 kg)  SpO2: 96%   Physical Exam  Constitutional:  Frail white female, nad  Cardiovascular: Normal rate, regular rhythm, normal heart sounds and intact distal pulses.   Pulmonary/Chest: Effort normal and breath sounds normal. No respiratory distress.  Abdominal: Soft. Bowel sounds are normal. She exhibits no distension and no mass. There is no tenderness.  Musculoskeletal: Normal range of motion.  Walks with rolling walker  Neurological: She is alert.  Oriented to person and place  Skin: Skin is warm and dry.  Psychiatric: She has a normal mood and affect.    Labs reviewed: Basic Metabolic Panel:  Recent Labs  07/08/13 0840 01/13/14 0822  NA 145* 143  K 4.7 3.8  CL 99 97  CO2 29 26  GLUCOSE 131* 136*  BUN 20 20  CREATININE 1.00 0.94  CALCIUM 11.2* 10.4*   Liver Function Tests:  Recent Labs  07/08/13 0840 01/13/14 0822  AST 16 16  ALT 8 10  ALKPHOS 93 83  BILITOT 0.4 0.4  PROT 6.8 6.4   CBC:  Recent Labs  07/08/13 0840 01/13/14 0822  WBC 15.8* 16.0*  NEUTROABS 7.7* 9.1*  HGB 14.1 14.3  HCT 40.8 41.6  MCV 91 89  PLT  --  468*    Lab Results  Component Value Date   HGBA1C 6.4* 01/13/2014   Assessment/Plan 1. Alzheimer's disease -has been stable, no changes  2. Pulmonary emphysema, unspecified emphysema type -stable, no changes  3. Constipation, slow transit -controlled with current regimen  4. Type 2 diabetes mellitus with diabetic polyneuropathy -hba1c was at goal for her age  65. Chronic frontal sinusitis -stable with regular mucinex use  6. First degree hemorrhoids -doing well lately  Labs/tests ordered:   Orders Placed This Encounter  Procedures  . CBC With differential/Platelet    Standing Status: Future     Number of Occurrences:      Standing Expiration Date: 09/16/2014  . Hemoglobin A1c    Standing Status: Future     Number of Occurrences:      Standing Expiration Date: 09/16/2014  . Lipid panel    Standing Status: Future     Number of Occurrences:      Standing Expiration Date: 09/16/2014    Order Specific Question:  Has the patient fasted?    Answer:  Yes    Next appt:  4 mos with labs before

## 2014-02-07 ENCOUNTER — Other Ambulatory Visit: Payer: Self-pay | Admitting: Internal Medicine

## 2014-02-07 ENCOUNTER — Encounter: Payer: Self-pay | Admitting: Internal Medicine

## 2014-03-07 ENCOUNTER — Other Ambulatory Visit: Payer: Self-pay | Admitting: Internal Medicine

## 2014-03-08 ENCOUNTER — Other Ambulatory Visit: Payer: Self-pay | Admitting: Internal Medicine

## 2014-04-13 ENCOUNTER — Other Ambulatory Visit: Payer: Self-pay | Admitting: Internal Medicine

## 2014-04-17 ENCOUNTER — Other Ambulatory Visit: Payer: Self-pay

## 2014-05-19 ENCOUNTER — Other Ambulatory Visit: Payer: Medicare Other

## 2014-05-19 DIAGNOSIS — E1142 Type 2 diabetes mellitus with diabetic polyneuropathy: Secondary | ICD-10-CM

## 2014-05-19 DIAGNOSIS — E1140 Type 2 diabetes mellitus with diabetic neuropathy, unspecified: Secondary | ICD-10-CM | POA: Diagnosis not present

## 2014-05-20 LAB — LIPID PANEL
Chol/HDL Ratio: 3 ratio units (ref 0.0–4.4)
Cholesterol, Total: 186 mg/dL (ref 100–199)
HDL: 63 mg/dL (ref >39)
LDL Calculated: 93 mg/dL (ref 0–99)
Triglycerides: 152 mg/dL — ABNORMAL HIGH (ref 0–149)
VLDL Cholesterol Cal: 30 mg/dL (ref 5–40)

## 2014-05-20 LAB — CBC WITH DIFFERENTIAL
Basophils Absolute: 0.1 10*3/uL (ref 0.0–0.2)
Basos: 1 %
Eos: 11 %
Eosinophils Absolute: 1.6 10*3/uL — ABNORMAL HIGH (ref 0.0–0.4)
HCT: 39.7 % (ref 34.0–46.6)
Hemoglobin: 13.5 g/dL (ref 11.1–15.9)
Immature Grans (Abs): 0 10*3/uL (ref 0.0–0.1)
Immature Granulocytes: 0 %
Lymphocytes Absolute: 3.6 10*3/uL — ABNORMAL HIGH (ref 0.7–3.1)
Lymphs: 24 %
MCH: 30.4 pg (ref 26.6–33.0)
MCHC: 34 g/dL (ref 31.5–35.7)
MCV: 89 fL (ref 79–97)
Monocytes Absolute: 1.4 10*3/uL — ABNORMAL HIGH (ref 0.1–0.9)
Monocytes: 10 %
Neutrophils Absolute: 8 10*3/uL — ABNORMAL HIGH (ref 1.4–7.0)
Neutrophils Relative %: 54 %
Platelets: 416 10*3/uL — ABNORMAL HIGH (ref 150–379)
RBC: 4.44 x10E6/uL (ref 3.77–5.28)
RDW: 13.1 % (ref 12.3–15.4)
WBC: 14.6 10*3/uL — ABNORMAL HIGH (ref 3.4–10.8)

## 2014-05-20 LAB — HEMOGLOBIN A1C
Est. average glucose Bld gHb Est-mCnc: 134 mg/dL
Hgb A1c MFr Bld: 6.3 % — ABNORMAL HIGH (ref 4.8–5.6)

## 2014-05-21 ENCOUNTER — Ambulatory Visit (INDEPENDENT_AMBULATORY_CARE_PROVIDER_SITE_OTHER): Payer: Medicare Other | Admitting: Internal Medicine

## 2014-05-21 ENCOUNTER — Encounter: Payer: Self-pay | Admitting: Internal Medicine

## 2014-05-21 VITALS — BP 136/72 | HR 78 | Temp 99.1°F | Ht 61.0 in | Wt 84.0 lb

## 2014-05-21 DIAGNOSIS — G309 Alzheimer's disease, unspecified: Secondary | ICD-10-CM

## 2014-05-21 DIAGNOSIS — K5901 Slow transit constipation: Secondary | ICD-10-CM

## 2014-05-21 DIAGNOSIS — E1142 Type 2 diabetes mellitus with diabetic polyneuropathy: Secondary | ICD-10-CM | POA: Diagnosis not present

## 2014-05-21 DIAGNOSIS — D72829 Elevated white blood cell count, unspecified: Secondary | ICD-10-CM | POA: Diagnosis not present

## 2014-05-21 DIAGNOSIS — Z23 Encounter for immunization: Secondary | ICD-10-CM

## 2014-05-21 DIAGNOSIS — M810 Age-related osteoporosis without current pathological fracture: Secondary | ICD-10-CM

## 2014-05-21 DIAGNOSIS — J439 Emphysema, unspecified: Secondary | ICD-10-CM

## 2014-05-21 DIAGNOSIS — F0280 Dementia in other diseases classified elsewhere without behavioral disturbance: Secondary | ICD-10-CM

## 2014-05-21 MED ORDER — PNEUMOCOCCAL 13-VAL CONJ VACC IM SUSP
0.5000 mL | Freq: Once | INTRAMUSCULAR | Status: AC
  Administered 2014-05-21: 0.5 mL via INTRAMUSCULAR

## 2014-05-21 MED ORDER — PNEUMOCOCCAL 13-VAL CONJ VACC IM SUSP: 0.5000 mL | INTRAMUSCULAR | Status: DC

## 2014-05-21 MED ORDER — PNEUMOCOCCAL 13-VAL CONJ VACC IM SUSP: 0.5000 mL | Freq: Once | INTRAMUSCULAR | Status: DC

## 2014-05-21 NOTE — Progress Notes (Signed)
Patient ID: Bianca Khan, female   DOB: [birth year], 80 y.o.   MRN: [id number]   Location:  [facility]  Code Status: DNR  No Known Allergies  Chief Complaint  Patient presents with  . Medical Management of Chronic Issues    4 Month Follow up    HPI: Patient is a 80 y.o. white female seen in the office today for medical mgt of chronic diseases.  She has dementia, osteoporosis, hemorrhoids, CAD, CHF, chronic sinusitis, COPD, DMII and chronic constipation.    Constipation remains problematic.  Doesn't take her bowel regimen regularly and doesn't always remember to ask for the miralax.    Review of Systems:  Review of Systems  Constitutional: Positive for weight loss. Negative for fever and malaise/fatigue.  HENT: Negative for hearing loss.   Eyes: Negative for blurred vision.  Respiratory: Negative for shortness of breath.   Cardiovascular: Negative for chest pain and leg swelling.  Gastrointestinal: Positive for constipation. Negative for blood in stool and melena.       Hemorrhoids  Genitourinary: Negative for dysuria, urgency and frequency.       May have to go sometimes 2x at night  Musculoskeletal: Negative for myalgias, joint pain and falls.  Skin: Negative for rash.  Neurological: Positive for sensory change. Negative for dizziness and weakness.       In feet bilaterally when takes off shoes and stockings but goes away when she lays down to sleep  Endo/Heme/Allergies: Bruises/bleeds easily.  Psychiatric/Behavioral: Positive for memory loss. Negative for depression. The patient does not have insomnia.    Past Medical History  Diagnosis Date  . COPD (chronic obstructive pulmonary disease)   . CHF (congestive heart failure)   . Edema   . Myocardial infarction   . Anxiety   . Hypertension   . Anemia   . Leukocytosis   . Acid reflux   . Osteoporosis   . Sinus drainage   . Weakness   . Unspecified intestinal obstruction   .  Unspecified constipation   . Type II or unspecified type diabetes mellitus without mention of complication, not stated as uncontrolled   . Solitary pulmonary nodule   . Other abnormal blood chemistry   . Acute bronchitis   . Adult failure to thrive   . Loss of weight   . Unspecified fall   . Insomnia, unspecified   . Insomnia, unspecified   . Open wound of scalp, without mention of complication   . Atrial fibrillation   . Edema   . Carbuncle and furuncle of other specified sites   . Personal history of methicillin resistant Staphylococcus aureus   . Impacted cerumen   . Chronic airway obstruction, not elsewhere classified   . Coronary atherosclerosis of native coronary artery   . Leukocytosis, unspecified   . Anxiety state, unspecified   . Depressive disorder, not elsewhere classified   . Unspecified essential hypertension   . Cardiomegaly   . Unspecified pleural effusion   . Unspecified pleural effusion   . Esophagitis, unspecified   . Reflux esophagitis   . Duodenal ulcer, unspecified as acute or chronic, without hemorrhage, perforation, or obstruction   . Diaphragmatic hernia without mention of obstruction or gangrene   . Senile osteoporosis   . Abnormal involuntary movements(781.0)   . Abnormality of gait   . Loss of weight   . Other dyspnea and respiratory abnormality     Past Surgical History  Procedure Laterality Date  .  Laparotomy  04/04/2012    Procedure: EXPLORATORY LAPAROTOMY;  Surgeon: Adin Hector, MD;  Location: [place];  Service: General;  Laterality: N/A;  . Abdominal hysterectomy  1991  . Hip surgery    . Fracture surgery  2011    hip    Social History:   reports that she has never smoked. She has never used smokeless tobacco. She reports that she drinks alcohol. She reports that she does not use illicit drugs.  Family History  Problem Relation Age of Onset  . Stroke Father     Medications: Patient's Medications  New Prescriptions   No  medications on file  Previous Medications   ACETAMINOPHEN (TYLENOL) 325 MG TABLET    Take 650 mg by mouth every 4 (four) hours as needed. Headache or pain   ALENDRONATE (FOSAMAX) 70 MG TABLET    TAKE 1 TABLET WEEKLY WITH 8 OUNCES OF WATER AND STAY UPRIGHT FOR 30 MINUTES AFTERWARD   ASPIRIN 81 MG TABLET    Take 81 mg by mouth daily.   CALCIUM-VITAMIN D-VITAMIN K (VIACTIV PO)    Take 1 each by mouth 2 (two) times daily.    CARTIA XT 240 MG 24 HR CAPSULE    TAKE 1 CAPSULE DAILY FOR HEART RHYTHM   DICLOFENAC SODIUM (VOLTAREN) 1 % GEL    Apply 2 g topically every 8 (eight) hours as needed. For joint pain   DOCUSATE SODIUM (COLACE) 100 MG CAPSULE    Take one tablet once daily   FEEDING SUPPLEMENT (RESOURCE BREEZE) LIQD    Take 1 Container by mouth 2 (two) times daily between meals.   GUAIFENESIN (MUCINEX) 600 MG 12 HR TABLET    Take 1 tablet (600 mg total) by mouth daily after breakfast.   HYDROCHLOROTHIAZIDE (HYDRODIURIL) 25 MG TABLET    TAKE 1 TABLET DAILY FOR EDEMA   HYDROCORTISONE (ANUSOL-HC) 25 MG SUPPOSITORY    Place 1 suppository (25 mg total) rectally daily as needed for hemorrhoids.   MIRTAZAPINE (REMERON) 15 MG TABLET    TAKE 1 TABLET AT BEDTIME   OMEPRAZOLE (PRILOSEC) 20 MG CAPSULE    TAKE 1 CAPSULE DAILY TO REDUCE STOMACH ACID AND TO HELP PROTECT ESOPHAGUS   POLYETHYLENE GLYCOL (MIRALAX / GLYCOLAX) PACKET    Take 17 g by mouth daily as needed. constipation   PROTEIN (PROCEL) POWD    Take by mouth 2 (two) times daily.   PROTEIN SUPPLEMENT (RESOURCE BENEPROTEIN) POWD    Take 1 scoop by mouth 2 (two) times daily with a meal.   SENNA-DOCUSATE (SENOKOT-S) 8.6-50 MG PER TABLET    Take 1 tablet by mouth daily as needed for constipation. For constipation   SPIRIVA HANDIHALER 18 MCG INHALATION CAPSULE    INHALE THE CONTENTS OF 1 CAPSULE ONCE DAILY   VITAMIN D, ERGOCALCIFEROL, (DRISDOL) 50000 UNITS CAPS CAPSULE    TAKE 1 CAPSULE ONCE A WEEK FOR VITAMIN D DEFICIENCY   ZOSTER VACCINE LIVE, PF,  (ZOSTAVAX) 16109 UNT/0.65ML INJECTION    Inject 19,400 Units into the skin once.  Modified Medications   No medications on file  Discontinued Medications   ALBUTEROL (PROAIR HFA) 108 (90 BASE) MCG/ACT INHALER    Inhale 2 puffs into the lungs every 6 (six) hours as needed for wheezing or shortness of breath.   BARRIER CREAM (NON-SPECIFIED) CREA    Apply 1 application topically 2 (two) times daily as needed. Moister barrier cream  Apply to buttock as needed for redness   HYDROCORTISONE (ANUSOL-HC) 2.5 %  RECTAL CREAM    Place rectally 2 (two) times daily. For hemorrhoids   MENTHOL-CETYLPYRIDINIUM (CEPACOL) 3 MG LOZENGE    Take 1 lozenge by mouth every 4 (four) hours as needed. For sore throat   ONDANSETRON (ZOFRAN) 4 MG TABLET    Take one tablet by mouth every 6 hours as needed for nausea     Physical Exam: Filed Vitals:   05/21/14 1317  BP: 136/72  Pulse: 78  Temp: 99.1 F (37.3 C)  TempSrc: Oral  Height: 5\' 1"  (1.549 m)  Weight: 84 lb (38.102 kg)  SpO2: 99%  Physical Exam  Constitutional: She appears well-developed and well-nourished. No distress.  Thin white female  HENT:  Head: Normocephalic and atraumatic.  Cardiovascular: Normal rate, regular rhythm, normal heart sounds and intact distal pulses.   Pulmonary/Chest: Effort normal and breath sounds normal. No respiratory distress.  Abdominal: Soft. Bowel sounds are normal. She exhibits no distension and no mass. There is no tenderness.  Musculoskeletal: Normal range of motion.  Walks with walker  Neurological: She is alert.  Oriented to person and place, not time, repeats herself frequently   Skin: Skin is warm and dry.  Psychiatric: She has a normal mood and affect.    Labs reviewed: Basic Metabolic Panel:  Recent Labs  07/08/13 0840 01/13/14 0822  NA 145* 143  K 4.7 3.8  CL 99 97  CO2 29 26  GLUCOSE 131* 136*  BUN 20 20  CREATININE 1.00 0.94  CALCIUM 11.2* 10.4*   Liver Function Tests:  Recent Labs   07/08/13 0840 01/13/14 0822  AST 16 16  ALT 8 10  ALKPHOS 93 83  BILITOT 0.4 0.4  PROT 6.8 6.4   No results for input(s): LIPASE, AMYLASE in the last 8760 hours. No results for input(s): AMMONIA in the last 8760 hours. CBC:  Recent Labs  07/08/13 0840 01/13/14 0822 05/19/14 0952  WBC 15.8* 16.0* 14.6*  NEUTROABS 7.7* 9.1* 8.0*  HGB 14.1 14.3 13.5  HCT 40.8 41.6 39.7  MCV 91 89 89  PLT  --  468* 416*   Lipid Panel:  Recent Labs  05/19/14 0952  HDL 63  LDLCALC 93  TRIG 152*  CHOLHDL 3.0   Lab Results  Component Value Date   HGBA1C 6.3* 05/19/2014    Assessment/Plan 1. Leukocytosis -cont to monitor cbc every 4-6 mos -suspect bone marrow dysfunction with age  59. Alzheimer's disease -cont to monitor, pt has refused medications b/c she is adamant she has no memory problems and her family has not really helped me to convince her that she does  3. Pulmonary emphysema, unspecified emphysema type -cont spiriva, prn albuterol (has not needed this so no longer on list)  4. Constipation, slow transit -cont bowel regimen, asked staff on paperwork to ask pt if she needs her prn in addition to her scheduled fiber and stool supplement  5. Type 2 diabetes mellitus with diabetic polyneuropathy -cont asa, diet  6. Senile osteoporosis -cont fosamax, ca with D  7.  Need for 13 valent pneumococcal -prevnar given today  She had her flu vaccine at [place].  Labs/tests ordered:  No new, will check cbc, bmp at next visit  Next appt:  4 mos  Lakin Romer L. Rayman Petrosian, D.O. [facility] [address], [place] [address] Cell Phone (Mon-Fri 8am-5pm):  [phone] On Call:  [phone] & follow prompts after 5pm & weekends Office Phone:  [phone] Office Fax:  [phone]

## 2014-06-04 ENCOUNTER — Other Ambulatory Visit: Payer: Self-pay | Admitting: Internal Medicine

## 2014-06-06 ENCOUNTER — Other Ambulatory Visit: Payer: Self-pay | Admitting: Internal Medicine

## 2014-06-10 ENCOUNTER — Other Ambulatory Visit: Payer: Self-pay | Admitting: Internal Medicine

## 2014-08-07 ENCOUNTER — Other Ambulatory Visit: Payer: Self-pay | Admitting: Nurse Practitioner

## 2014-08-18 ENCOUNTER — Telehealth: Payer: Self-pay | Admitting: *Deleted

## 2014-08-18 NOTE — Telephone Encounter (Signed)
Fax from [facility] regarding patient. Dr. Bubba Camp wanted me to send this to you. Resident complains of Nausea and Nervousness this morning. Patient refused all medications. What do you Advise?  Vitals:  99.9, 22, 80, 100/52

## 2014-08-18 NOTE — Telephone Encounter (Signed)
She should be seen for evaluation.  Unusual for her to refuse her meds.  Looks like she has low grade temp.  If we don't have anything in the next two days, let's order a cbc, cmp, ua c+s.  They should encourage her to drink fluids and monitor her vitals each shift while awake.

## 2014-08-18 NOTE — Telephone Encounter (Signed)
Appointment scheduled with [facility] for Thursday with Dr. Mariea Clonts. Agreed.

## 2014-08-19 ENCOUNTER — Other Ambulatory Visit: Payer: Self-pay | Admitting: Internal Medicine

## 2014-08-20 ENCOUNTER — Ambulatory Visit (INDEPENDENT_AMBULATORY_CARE_PROVIDER_SITE_OTHER): Payer: Medicare Other | Admitting: Internal Medicine

## 2014-08-20 ENCOUNTER — Encounter: Payer: Self-pay | Admitting: Internal Medicine

## 2014-08-20 VITALS — BP 118/78 | HR 90 | Temp 99.2°F | Resp 10 | Ht 61.0 in | Wt 83.0 lb

## 2014-08-20 DIAGNOSIS — R110 Nausea: Secondary | ICD-10-CM | POA: Insufficient documentation

## 2014-08-20 DIAGNOSIS — F419 Anxiety disorder, unspecified: Secondary | ICD-10-CM

## 2014-08-20 DIAGNOSIS — G309 Alzheimer's disease, unspecified: Secondary | ICD-10-CM

## 2014-08-20 DIAGNOSIS — K5901 Slow transit constipation: Secondary | ICD-10-CM | POA: Diagnosis not present

## 2014-08-20 DIAGNOSIS — D72829 Elevated white blood cell count, unspecified: Secondary | ICD-10-CM

## 2014-08-20 DIAGNOSIS — E1142 Type 2 diabetes mellitus with diabetic polyneuropathy: Secondary | ICD-10-CM | POA: Diagnosis not present

## 2014-08-20 DIAGNOSIS — E1140 Type 2 diabetes mellitus with diabetic neuropathy, unspecified: Secondary | ICD-10-CM | POA: Diagnosis not present

## 2014-08-20 DIAGNOSIS — F0280 Dementia in other diseases classified elsewhere without behavioral disturbance: Secondary | ICD-10-CM

## 2014-08-20 MED ORDER — SERTRALINE HCL 25 MG PO TABS: 25.0000 mg | ORAL_TABLET | Freq: Every day | ORAL | Status: DC

## 2014-08-20 NOTE — Progress Notes (Signed)
Patient ID: Bianca Khan, female   DOB: January 12, 1915, 81 y.o.   MRN: [id number]   Location:  [facility]  Code Status: DNR; Advanced Directive information Does patient have an advance directive?: Yes, Type of Advance Directive: [place];Living will;Out of facility DNR (pink MOST or yellow form), Does patient want to make changes to advanced directive?: No - Patient declined   No Known Allergies  Chief Complaint  Patient presents with  . Medical Management of Chronic Issues    Nausea and nervousness    HPI: Patient is a 82 y.o. white female seen in the office today for an acute visit for feeling nauseated and nervous.  Nausea came on suddenly Tuesday morning, no vomiting. She was not able to eat. She woke up feeling nauseated and then became very nervous. Nausea resolved when she woke up Wednesday morning. No nausea today. She just feels weak. She has not gotten her appetite back. Has not eaten anything today. Denies diarrhea. Notes some constipation, but it is relieved with current regimen. She did have a bowel movement yesterday.   Denies dyspnea, cough, chest pain.   No dysuria, hematuria, frequency, urgency, no abdominal pain.   On Monday, she was looking at pictures which brought up memories that upset her. She is tearful in the office today bringing it up. Has felt like this for about a week. She is interested in some medication to help her mood.   No other complaints.  Review of Systems:  Review of Systems  Constitutional: Positive for malaise/fatigue. Negative for chills and diaphoresis.  Respiratory: Negative for cough and hemoptysis.   Cardiovascular: Negative for chest pain, palpitations and orthopnea.  Gastrointestinal: Positive for nausea. Negative for heartburn, vomiting, abdominal pain, diarrhea and constipation.  Genitourinary: Negative for dysuria, urgency and frequency.  Neurological: Negative for dizziness.   Psychiatric/Behavioral: Positive for depression. The patient is nervous/anxious.     Past Medical History  Diagnosis Date  . COPD (chronic obstructive pulmonary disease)   . CHF (congestive heart failure)   . Edema   . Myocardial infarction   . Anxiety   . Hypertension   . Anemia   . Leukocytosis   . Acid reflux   . Osteoporosis   . Sinus drainage   . Weakness   . Unspecified intestinal obstruction   . Unspecified constipation   . Type II or unspecified type diabetes mellitus without mention of complication, not stated as uncontrolled   . Solitary pulmonary nodule   . Other abnormal blood chemistry   . Acute bronchitis   . Adult failure to thrive   . Loss of weight   . Unspecified fall   . Insomnia, unspecified   . Insomnia, unspecified   . Open wound of scalp, without mention of complication   . Atrial fibrillation   . Edema   . Carbuncle and furuncle of other specified sites   . Personal history of methicillin resistant Staphylococcus aureus   . Impacted cerumen   . Chronic airway obstruction, not elsewhere classified   . Coronary atherosclerosis of native coronary artery   . Leukocytosis, unspecified   . Anxiety state, unspecified   . Depressive disorder, not elsewhere classified   . Unspecified essential hypertension   . Cardiomegaly   . Unspecified pleural effusion   . Unspecified pleural effusion   . Esophagitis, unspecified   . Reflux esophagitis   . Duodenal ulcer, unspecified as acute or chronic, without hemorrhage, perforation,  or obstruction   . Diaphragmatic hernia without mention of obstruction or gangrene   . Senile osteoporosis   . Abnormal involuntary movements(781.0)   . Abnormality of gait   . Loss of weight   . Other dyspnea and respiratory abnormality     Past Surgical History  Procedure Laterality Date  . Laparotomy  04/04/2012    Procedure: EXPLORATORY LAPAROTOMY;  Surgeon: Adin Hector, MD;  Location: [place];  Service: General;   Laterality: N/A;  . Abdominal hysterectomy  1991  . Hip surgery    . Fracture surgery  2011    hip    Social History:   reports that she has never smoked. She has never used smokeless tobacco. She reports that she drinks alcohol. She reports that she does not use illicit drugs.  Family History  Problem Relation Age of Onset  . Stroke Father     Medications: Patient's Medications  New Prescriptions   No medications on file  Previous Medications   ACETAMINOPHEN (TYLENOL) 325 MG TABLET    Take 650 mg by mouth every 4 (four) hours as needed. Headache or pain   ALENDRONATE (FOSAMAX) 70 MG TABLET    TAKE 1 TABLET WEEKLY WITH 8 OUNCES OF WATER AND STAY UPRIGHT FOR 30 MINUTES AFTERWARD   ASPIRIN 81 MG TABLET    Take 81 mg by mouth daily.   CALCIUM-VITAMIN D-VITAMIN K (VIACTIV PO)    Take 1 each by mouth 2 (two) times daily.    CARTIA XT 240 MG 24 HR CAPSULE    TAKE 1 CAPSULE DAILY FOR HEART RHYTHM   DICLOFENAC SODIUM (VOLTAREN) 1 % GEL    Apply 2 g topically every 8 (eight) hours as needed. For joint pain   DOCUSATE SODIUM (COLACE) 100 MG CAPSULE    Take one tablet once daily   FEEDING SUPPLEMENT (RESOURCE BREEZE) LIQD    Take 1 Container by mouth 2 (two) times daily between meals.   GUAIFENESIN (MUCINEX) 600 MG 12 HR TABLET    Take 1 tablet (600 mg total) by mouth daily after breakfast.   HYDROCHLOROTHIAZIDE (HYDRODIURIL) 25 MG TABLET    TAKE 1 TABLET DAILY FOR EDEMA   HYDROCORTISONE (ANUSOL-HC) 25 MG SUPPOSITORY    Place 1 suppository (25 mg total) rectally daily as needed for hemorrhoids.   MIRTAZAPINE (REMERON) 15 MG TABLET    TAKE 1 TABLET AT BEDTIME   OMEPRAZOLE (PRILOSEC) 20 MG CAPSULE    TAKE 1 CAPSULE DAILY TO REDUCE STOMACH ACID AND TO HELP PROTECT ESOPHAGUS   POLYETHYLENE GLYCOL (MIRALAX / GLYCOLAX) PACKET    Take 17 g by mouth daily as needed. constipation   PROAIR HFA 108 (90 BASE) MCG/ACT INHALER    INHALE 2 PUFFS INTO THE LUNGS EVERY 6 HOURS AS NEEDED FOR WHEEZING OR  SHORTNESS OF BREATH   PROTEIN (PROCEL) POWD    Take by mouth 2 (two) times daily.   PROTEIN SUPPLEMENT (RESOURCE BENEPROTEIN) POWD    Take 1 scoop by mouth 2 (two) times daily with a meal.   SENNA-DOCUSATE (SENOKOT-S) 8.6-50 MG PER TABLET    Take 1 tablet by mouth daily as needed for constipation. For constipation   SPIRIVA HANDIHALER 18 MCG INHALATION CAPSULE    INHALE THE CONTENTS OF 1 CAPSULE ONCE DAILY   VITAMIN D, ERGOCALCIFEROL, (DRISDOL) 50000 UNITS CAPS CAPSULE    TAKE 1 CAPSULE ONCE A WEEK FOR VITAMIN D DEFICIENCY   ZOSTER VACCINE LIVE, PF, (ZOSTAVAX) 12878 UNT/0.65ML INJECTION  Inject 19,400 Units into the skin once.  Modified Medications   No medications on file  Discontinued Medications   No medications on file     Physical Exam: Filed Vitals:   08/20/14 1309  Height: 5\' 1"  (1.549 m)   Physical Exam  Cardiovascular: Normal rate, regular rhythm, normal heart sounds and intact distal pulses.   Pulmonary/Chest: Effort normal and breath sounds normal. No respiratory distress. She has no wheezes. She has no rales.  Abdominal: Soft. Bowel sounds are normal. She exhibits no distension and no mass. There is no tenderness.  Musculoskeletal:  Weak, uses walker  Neurological: She is alert.  Skin: Skin is warm and dry.    Labs reviewed: Basic Metabolic Panel:  Recent Labs  01/13/14 0822  NA 143  K 3.8  CL 97  CO2 26  GLUCOSE 136*  BUN 20  CREATININE 0.94  CALCIUM 10.4*   Liver Function Tests:  Recent Labs  01/13/14 0822  AST 16  ALT 10  ALKPHOS 83  BILITOT 0.4  PROT 6.4   No results for input(s): LIPASE, AMYLASE in the last 8760 hours. No results for input(s): AMMONIA in the last 8760 hours. CBC:  Recent Labs  01/13/14 0822 05/19/14 0952  WBC 16.0* 14.6*  NEUTROABS 9.1* 8.0*  HGB 14.3 13.5  HCT 41.6 39.7  MCV 89 89  PLT 468* 416*   Lipid Panel:  Recent Labs  05/19/14 0952  HDL 63  LDLCALC 93  TRIG 152*  CHOLHDL 3.0   Lab Results    Component Value Date   HGBA1C 6.3* 05/19/2014     Assessment/Plan 1. Nausea without vomiting -seems this was primarily anxiety related -she had previously been on zoloft for her anxiety but it was stopped in the past few months at her request due to stability with mood, but now they are trying to sell her house and she is very upset about it b/c she does not recognize her dementia and thinks she is safe to return home despite plenty of education over the years on this - will r/o gi etiology with low grade temp, but has had this as well as profound leukocytosis longstanding felt to be bone marrow related and workup does not make sense at 81 yo b/c bone marrow would not be done - Comprehensive metabolic panel - Lipase - Amylase - CBC with Differential/Platelet  2. Anxiety - as above, restart on zoloft - sertraline (ZOLOFT) 25 MG tablet; Take 1 tablet (25 mg total) by mouth daily.  Dispense: 30 tablet; Refill: 3  3. Alzheimer's disease -is moderate -she has very significant short term memory loss and cannot take her pills on her own, but does otherwise do ADLs w/o difficulty -we have discussed again today and on numerous previous occasions that she does not want resuscitation--another goldenrod was done today for her daughter to carry b/c there has clearly been confusion about this during her hospitalizations  4. Leukocytosis -chronic, suspected myelodysplastic syndrome - CBC with Differential/Platelet  5. Constipation, slow transit -stable, helped with senna s  6. Type 2 diabetes mellitus with diabetic polyneuropathy - cont diet only--drinks a lot of juices, and does not eat much food  - Hemoglobin A1c  Labs/tests ordered:   Orders Placed This Encounter  Procedures  . Comprehensive metabolic panel  . Lipase  . Amylase  . Hemoglobin A1c  . CBC with Differential/Platelet    Next appt:  3 mos  Derold Dorsch L. Mitch Arquette, D.O. [facility] [address], [place] [address] Cell Phone (Mon-Fri 8am-5pm):  [phone] On Call:  [phone] & follow prompts after 5pm & weekends Office Phone:  [phone] Office Fax:  [phone]

## 2014-08-21 LAB — CBC WITH DIFFERENTIAL/PLATELET
Basophils Absolute: 0.1 10*3/uL (ref 0.0–0.2)
Basos: 0 %
Eos: 5 %
Eosinophils Absolute: 1.1 10*3/uL — ABNORMAL HIGH (ref 0.0–0.4)
HCT: 37.5 % (ref 34.0–46.6)
Hemoglobin: 12.7 g/dL (ref 11.1–15.9)
Immature Grans (Abs): 0 10*3/uL (ref 0.0–0.1)
Immature Granulocytes: 0 %
Lymphocytes Absolute: 2.6 10*3/uL (ref 0.7–3.1)
Lymphs: 11 %
MCH: 30.1 pg (ref 26.6–33.0)
MCHC: 33.9 g/dL (ref 31.5–35.7)
MCV: 89 fL (ref 79–97)
Monocytes Absolute: 2.4 10*3/uL — ABNORMAL HIGH (ref 0.1–0.9)
Monocytes: 10 %
Neutrophils Absolute: 17.4 10*3/uL — ABNORMAL HIGH (ref 1.4–7.0)
Neutrophils Relative %: 74 %
Platelets: 481 10*3/uL — ABNORMAL HIGH (ref 150–379)
RBC: 4.22 x10E6/uL (ref 3.77–5.28)
RDW: 13.2 % (ref 12.3–15.4)
WBC: 23.6 10*3/uL (ref 3.4–10.8)

## 2014-08-21 LAB — LIPASE: Lipase: 14 U/L (ref 0–59)

## 2014-08-21 LAB — COMPREHENSIVE METABOLIC PANEL WITH GFR
ALT: 8 IU/L (ref 0–32)
AST: 13 IU/L (ref 0–40)
Albumin/Globulin Ratio: 1.4 (ref 1.1–2.5)
Albumin: 3.9 g/dL (ref 3.2–4.6)
Alkaline Phosphatase: 87 IU/L (ref 39–117)
BUN/Creatinine Ratio: 18 (ref 11–26)
BUN: 16 mg/dL (ref 10–36)
Bilirubin Total: 0.5 mg/dL (ref 0.0–1.2)
CO2: 29 mmol/L (ref 18–29)
Calcium: 11.2 mg/dL — ABNORMAL HIGH (ref 8.7–10.3)
Chloride: 92 mmol/L — ABNORMAL LOW (ref 97–108)
Creatinine, Ser: 0.88 mg/dL (ref 0.57–1.00)
GFR calc Af Amer: 64 mL/min/1.73
GFR calc non Af Amer: 55 mL/min/1.73 — ABNORMAL LOW
Globulin, Total: 2.8 g/dL (ref 1.5–4.5)
Glucose: 119 mg/dL — ABNORMAL HIGH (ref 65–99)
Potassium: 4.4 mmol/L (ref 3.5–5.2)
Sodium: 138 mmol/L (ref 134–144)
Total Protein: 6.7 g/dL (ref 6.0–8.5)

## 2014-08-21 LAB — AMYLASE: Amylase: 35 U/L (ref 31–124)

## 2014-08-21 LAB — HEMOGLOBIN A1C
Est. average glucose Bld gHb Est-mCnc: 143 mg/dL
Hgb A1c MFr Bld: 6.6 % — ABNORMAL HIGH (ref 4.8–5.6)

## 2014-08-22 DIAGNOSIS — R319 Hematuria, unspecified: Secondary | ICD-10-CM | POA: Diagnosis not present

## 2014-08-22 DIAGNOSIS — N390 Urinary tract infection, site not specified: Secondary | ICD-10-CM | POA: Diagnosis not present

## 2014-08-27 ENCOUNTER — Other Ambulatory Visit: Payer: Self-pay | Admitting: Internal Medicine

## 2014-09-05 ENCOUNTER — Encounter: Payer: Self-pay | Admitting: Internal Medicine

## 2014-09-24 ENCOUNTER — Ambulatory Visit: Payer: Medicare Other | Admitting: Internal Medicine

## 2014-10-08 ENCOUNTER — Ambulatory Visit: Payer: Medicare Other | Admitting: Internal Medicine

## 2014-10-26 ENCOUNTER — Other Ambulatory Visit: Payer: Self-pay | Admitting: Internal Medicine

## 2014-10-26 NOTE — Progress Notes (Signed)
Received fax from [facility] that resident has been refusing her zoloft for several days.  Won't take in anything (applesauce, pudding etc or alone).  Discontinue order written due to resident refusal and med removed from list.  Order written for [facility] staff to inform pt's daughter, Colletta Maryland, that the medication was stopped due to resident refusal.

## 2014-11-12 ENCOUNTER — Ambulatory Visit
Admission: RE | Admit: 2014-11-12 | Discharge: 2014-11-12 | Disposition: A | Discharge status: Home / Self Care | Payer: Medicare Other | Source: Ambulatory Visit | Attending: Internal Medicine | Admitting: Internal Medicine

## 2014-11-12 ENCOUNTER — Encounter: Payer: Self-pay | Admitting: Internal Medicine

## 2014-11-12 ENCOUNTER — Ambulatory Visit (INDEPENDENT_AMBULATORY_CARE_PROVIDER_SITE_OTHER): Payer: Medicare Other | Admitting: Internal Medicine

## 2014-11-12 VITALS — BP 100/62 | HR 72 | Temp 98.2°F | Resp 18 | Wt 85.0 lb

## 2014-11-12 DIAGNOSIS — J209 Acute bronchitis, unspecified: Secondary | ICD-10-CM | POA: Diagnosis not present

## 2014-11-12 DIAGNOSIS — R197 Diarrhea, unspecified: Secondary | ICD-10-CM

## 2014-11-12 DIAGNOSIS — M810 Age-related osteoporosis without current pathological fracture: Secondary | ICD-10-CM

## 2014-11-12 DIAGNOSIS — K219 Gastro-esophageal reflux disease without esophagitis: Secondary | ICD-10-CM | POA: Diagnosis not present

## 2014-11-12 DIAGNOSIS — R531 Weakness: Secondary | ICD-10-CM | POA: Diagnosis not present

## 2014-11-12 DIAGNOSIS — R05 Cough: Secondary | ICD-10-CM | POA: Diagnosis not present

## 2014-11-12 MED ORDER — ALBUTEROL SULFATE HFA 108 (90 BASE) MCG/ACT IN AERS: 2.0000 | INHALATION_SPRAY | Freq: Four times a day (QID) | RESPIRATORY_TRACT | Status: DC | PRN

## 2014-11-12 MED ORDER — VITAMIN D (ERGOCALCIFEROL) 1.25 MG (50000 UNIT) PO CAPS: 50000.0000 [IU] | ORAL_CAPSULE | ORAL | Status: DC

## 2014-11-12 NOTE — Progress Notes (Signed)
Patient ID: Bianca Khan, female   DOB: May 09, 1915, 81 y.o.   MRN: [id number]   Location:  [facility]  Code Status: DNR Goals of Care: Advanced Directive information Does patient have an advance directive?: Yes, Type of Advance Directive: [place];Living will;Out of facility DNR (pink MOST or yellow form), Pre-existing out of facility DNR order (yellow form or pink MOST form): Yellow form placed in chart (order not valid for inpatient use), Does patient want to make changes to advanced directive?: No - Patient declined   Allergies  Allergen Reactions  . Zoloft [Sertraline Hcl]     Burning in throat     Chief Complaint  Patient presents with  . Acute Visit    Weak, wheezing (x 1 week) and diarrhea X 2-3 days   . Medication Management    Request for Proair inhaler     HPI: Patient is a 81 y.o. white female seen in the office today for acute visit.    Likes the [employer], vanilla and strawberry.  Main concern is that she is back to coughing and wheezing.  Proair got stopped b/c she didn't use it at [facility], but now needs again.  Is still taking spiriva.  Has mucinex daily as needed, but they have not given it to her.    Last day or two, has had loose stools--happened after she didn't go for a day.  First day was worse than second.  Only a little loose this am.  Martin Majestic to her family reunion and really enjoyed.  Review of Systems:  Review of Systems  Constitutional: Positive for malaise/fatigue. Negative for fever, chills and weight loss.  HENT: Positive for congestion.   Respiratory: Positive for cough, sputum production and wheezing. Negative for shortness of breath.   Cardiovascular: Negative for chest pain and leg swelling.  Gastrointestinal: Positive for diarrhea and constipation. Negative for abdominal pain, blood in stool and melena.  Genitourinary: Negative for dysuria.  Musculoskeletal:  Negative for falls.  Skin: Negative for rash.  Neurological: Positive for weakness. Negative for dizziness.  Endo/Heme/Allergies: Bruises/bleeds easily.  Psychiatric/Behavioral: Positive for memory loss.     Past Medical History  Diagnosis Date  . COPD (chronic obstructive pulmonary disease)   . CHF (congestive heart failure)   . Edema   . Myocardial infarction   . Anxiety   . Hypertension   . Anemia   . Leukocytosis   . Acid reflux   . Osteoporosis   . Sinus drainage   . Weakness   . Unspecified intestinal obstruction   . Unspecified constipation   . Type II or unspecified type diabetes mellitus without mention of complication, not stated as uncontrolled   . Solitary pulmonary nodule   . Other abnormal blood chemistry   . Acute bronchitis   . Adult failure to thrive   . Loss of weight   . Unspecified fall   . Insomnia, unspecified   . Insomnia, unspecified   . Open wound of scalp, without mention of complication   . Atrial fibrillation   . Edema   . Carbuncle and furuncle of other specified sites   . Personal history of methicillin resistant Staphylococcus aureus   . Impacted cerumen   . Chronic airway obstruction, not elsewhere classified   . Coronary atherosclerosis of native coronary artery   . Leukocytosis, unspecified   . Anxiety state, unspecified   . Depressive disorder, not elsewhere classified   . Unspecified  essential hypertension   . Cardiomegaly   . Unspecified pleural effusion   . Unspecified pleural effusion   . Esophagitis, unspecified   . Reflux esophagitis   . Duodenal ulcer, unspecified as acute or chronic, without hemorrhage, perforation, or obstruction   . Diaphragmatic hernia without mention of obstruction or gangrene   . Senile osteoporosis   . Abnormal involuntary movements(781.0)   . Abnormality of gait   . Loss of weight   . Other dyspnea and respiratory abnormality     Past Surgical History  Procedure Laterality Date  .  Laparotomy  04/04/2012    Procedure: EXPLORATORY LAPAROTOMY;  Surgeon: Adin Hector, MD;  Location: [facility];  Service: General;  Laterality: N/A;  . Abdominal hysterectomy  1991  . Hip surgery    . Fracture surgery  2011    hip    Social History:   reports that she has never smoked. She has never used smokeless tobacco. She reports that she drinks alcohol. She reports that she does not use illicit drugs.  Family History  Problem Relation Age of Onset  . Stroke Father     Medications: Patient's Medications  New Prescriptions   No medications on file  Previous Medications   ALENDRONATE (FOSAMAX) 70 MG TABLET    TAKE 1 TABLET WEEKLY WITH 8 OUNCES OF WATER AND STAY UPRIGHT FOR 30 MINUTES AFTERWARD   ASPIRIN 81 MG TABLET    Take 81 mg by mouth daily.   CALCIUM-VITAMIN D-VITAMIN K (VIACTIV PO)    Take 1 each by mouth 2 (two) times daily.    CARTIA XT 240 MG 24 HR CAPSULE    TAKE 1 CAPSULE DAILY FOR HEART RHYTHM   DOCUSATE SODIUM (COLACE) 100 MG CAPSULE    Take one tablet once daily   GUAIFENESIN (MUCINEX) 600 MG 12 HR TABLET    Take 1 tablet (600 mg total) by mouth daily after breakfast.   HYDROCHLOROTHIAZIDE (HYDRODIURIL) 25 MG TABLET    TAKE 1 TABLET DAILY FOR EDEMA   HYDROCORTISONE (ANUSOL-HC) 25 MG SUPPOSITORY    Place 1 suppository (25 mg total) rectally daily as needed for hemorrhoids.   IBUPROFEN (ADVIL,MOTRIN) 200 MG TABLET    Take 400 mg by mouth every 6 (six) hours as needed for mild pain.   MIRTAZAPINE (REMERON) 15 MG TABLET    TAKE 1 TABLET AT BEDTIME   OMEPRAZOLE (PRILOSEC) 20 MG CAPSULE    TAKE 1 CAPSULE DAILY TO REDUCE STOMACH ACID AND TO HELP PROTECT ESOPHAGUS   POLYETHYLENE GLYCOL (MIRALAX / GLYCOLAX) PACKET    Take 17 g by mouth daily as needed. constipation   PSYLLIUM (METAMUCIL) 58.6 % PACKET    1 scoop into 8 oz liquid   SENNA-DOCUSATE (SENOKOT-S) 8.6-50 MG PER TABLET    Take 1 tablet by mouth daily as needed for constipation. For constipation   SPIRIVA HANDIHALER  18 MCG INHALATION CAPSULE    INHALE THE CONTENTS OF 1 CAPSULE ONCE DAILY   VITAMIN D, ERGOCALCIFEROL, (DRISDOL) 50000 UNITS CAPS CAPSULE    TAKE 1 CAPSULE WEEKLY FOR VITAMIN D DEFICIENCY  Modified Medications   No medications on file  Discontinued Medications   ZOSTER VACCINE LIVE, PF, (ZOSTAVAX) 87867 UNT/0.65ML INJECTION    Inject 19,400 Units into the skin once.     Physical Exam: Filed Vitals:   11/12/14 1407  BP: 100/62  Pulse: 72  Temp: 98.2 F (36.8 C)  TempSrc: Oral  Resp: 18  Weight: 85 lb (38.556 kg)  SpO2: 91%  Physical Exam  Constitutional:  Frail white female walks with walker  Cardiovascular: Normal rate, regular rhythm, normal heart sounds and intact distal pulses.   Pulmonary/Chest: Effort normal. No respiratory distress. She has wheezes. She has no rales.  Abdominal: Bowel sounds are normal.  Musculoskeletal: Normal range of motion.  Neurological: She is alert.  Poor short term memory loss   Skin: Skin is warm and dry.  Psychiatric: She has a normal mood and affect.    Labs reviewed: Basic Metabolic Panel:  Recent Labs  01/13/14 0822 08/20/14 1422  NA 143 138  K 3.8 4.4  CL 97 92*  CO2 26 29  GLUCOSE 136* 119*  BUN 20 16  CREATININE 0.94 0.88  CALCIUM 10.4* 11.2*   Liver Function Tests:  Recent Labs  01/13/14 0822 08/20/14 1422  AST 16 13  ALT 10 8  ALKPHOS 83 87  BILITOT 0.4 0.5  PROT 6.4 6.7    Recent Labs  08/20/14 1422  LIPASE 14  AMYLASE 35   No results for input(s): AMMONIA in the last 8760 hours. CBC:  Recent Labs  01/13/14 0822 05/19/14 0952 08/20/14 1422  WBC 16.0* 14.6* 23.6*  NEUTROABS 9.1* 8.0* 17.4*  HGB 14.3 13.5 12.7  HCT 41.6 39.7 37.5  MCV 89 89 89  PLT 468* 416* 481*   Lipid Panel:  Recent Labs  05/19/14 0952  CHOL 186  HDL 63  LDLCALC 93  TRIG 152*  CHOLHDL 3.0   Lab Results  Component Value Date   HGBA1C 6.6* 08/20/2014    Assessment/Plan 1. Gastroesophageal reflux disease,  esophagitis presence not specified -cont omeprazole -is off alendronate and pt thinks symptoms are due to her zoloft, but she's been on it for years and had no problems--has stopped it  2. Diarrhea -probably due to stool softeners after episode of constipation  3. Weakness -due to acute bronchitis (possible pneumonia)  4. Osteoporosis, senile - off fosamax due to gerd--discussed she would need prolia or reclast instead, but does not want her dexa repeated - Vitamin D, Ergocalciferol, (DRISDOL) 50000 UNITS CAPS capsule; Take 1 capsule (50,000 Units total) by mouth every 7 (seven) days.  Dispense: 4 capsule; Refill: 11  5. Acute bronchitis, unspecified organism - DG Chest 2 View; Future - CBC with Differential/Platelet - Basic metabolic panel - albuterol (PROVENTIL HFA;VENTOLIN HFA) 108 (90 BASE) MCG/ACT inhaler; Inhale 2 puffs into the lungs every 6 (six) hours as needed for wheezing or shortness of breath.  Dispense: 1 Inhaler; Refill: 11 -use mucinex as ordered -will treat with appropriate abx based on cxr results--doubt pneumonia, but want to be certain with her frailty -labs also ordered to check wbc and cr    Labs/tests ordered:   Orders Placed This Encounter  Procedures  . DG Chest 2 View    Standing Status: Future     Number of Occurrences:      Standing Expiration Date: 01/12/2016    Order Specific Question:  Reason for Exam (SYMPTOM  OR DIAGNOSIS REQUIRED)    Answer:  cough, wheezing throught lung [place]    Order Specific Question:  Preferred imaging location?    Answer:  [facility]    Order Specific Question:  Call Report- Best Contact Number?    Answer:  [phone]  . CBC with Differential/Platelet  . Basic metabolic panel    Next appt:  1 weeks as scheduled  Gilles Trimpe L. Kearra Calkin, D.O. [facility] [phone]  [address], [place] [address] Cell Phone (Mon-Fri 8am-5pm):  [phone] On Call:  [phone] &  follow prompts after 5pm & weekends Office Phone:  [phone] Office Fax:  [phone]

## 2014-11-13 ENCOUNTER — Telehealth: Payer: Self-pay

## 2014-11-13 LAB — BASIC METABOLIC PANEL
BUN/Creatinine Ratio: 19 (ref 11–26)
BUN: 17 mg/dL (ref 10–36)
CO2: 29 mmol/L (ref 18–29)
Calcium: 10.1 mg/dL (ref 8.7–10.3)
Chloride: 96 mmol/L — ABNORMAL LOW (ref 97–108)
Creatinine, Ser: 0.9 mg/dL (ref 0.57–1.00)
GFR calc Af Amer: 62 mL/min/{1.73_m2} (ref >59)
GFR calc non Af Amer: 54 mL/min/{1.73_m2} — ABNORMAL LOW (ref >59)
Glucose: 118 mg/dL — ABNORMAL HIGH (ref 65–99)
Potassium: 3.6 mmol/L (ref 3.5–5.2)
Sodium: 141 mmol/L (ref 134–144)

## 2014-11-13 LAB — CBC WITH DIFFERENTIAL/PLATELET
Basophils Absolute: 0.1 10*3/uL (ref 0.0–0.2)
Basos: 0 %
EOS (ABSOLUTE): 1.9 10*3/uL — ABNORMAL HIGH (ref 0.0–0.4)
Eos: 13 %
Hematocrit: 40.4 % (ref 34.0–46.6)
Hemoglobin: 14.1 g/dL (ref 11.1–15.9)
Immature Grans (Abs): 0 10*3/uL (ref 0.0–0.1)
Immature Granulocytes: 0 %
Lymphocytes Absolute: 3 10*3/uL (ref 0.7–3.1)
Lymphs: 20 %
MCH: 30.5 pg (ref 26.6–33.0)
MCHC: 34.9 g/dL (ref 31.5–35.7)
MCV: 87 fL (ref 79–97)
Monocytes Absolute: 1.9 10*3/uL — ABNORMAL HIGH (ref 0.1–0.9)
Monocytes: 13 %
Neutrophils Absolute: 8.1 10*3/uL — ABNORMAL HIGH (ref 1.4–7.0)
Neutrophils: 54 %
Platelets: 486 10*3/uL — ABNORMAL HIGH (ref 150–379)
RBC: 4.63 x10E6/uL (ref 3.77–5.28)
RDW: 13.3 % (ref 12.3–15.4)
WBC: 15 10*3/uL — ABNORMAL HIGH (ref 3.4–10.8)

## 2014-11-13 MED ORDER — DOXYCYCLINE HYCLATE 100 MG PO TABS: 100.0000 mg | ORAL_TABLET | Freq: Two times a day (BID) | ORAL | Status: DC

## 2014-11-13 MED ORDER — SACCHAROMYCES BOULARDII 250 MG PO CAPS: 250.0000 mg | ORAL_CAPSULE | Freq: Two times a day (BID) | ORAL | Status: DC

## 2014-11-13 NOTE — Telephone Encounter (Signed)
-----   Message from Gayland Curry, DO sent at 11/13/2014  8:30 AM EDT ----- CXR did not show any pneumonia.  Due to her COPD, let's put her on doxycycline 100mg  po bid for 10 days and florastor probiotic 250mg  po bid for 14 days.  Please inform Colletta Maryland and send orders to [facility].

## 2014-11-13 NOTE — Telephone Encounter (Signed)
Discussed results with patients son, Mr.Hursey verbalized understanding of results. Copy of report and rx's faxed to [facility]

## 2014-11-16 ENCOUNTER — Other Ambulatory Visit: Payer: Self-pay | Admitting: Internal Medicine

## 2014-11-19 ENCOUNTER — Other Ambulatory Visit: Payer: Self-pay

## 2014-11-19 ENCOUNTER — Encounter: Payer: Self-pay | Admitting: Internal Medicine

## 2014-11-19 ENCOUNTER — Ambulatory Visit (INDEPENDENT_AMBULATORY_CARE_PROVIDER_SITE_OTHER): Payer: Medicare Other | Admitting: Internal Medicine

## 2014-11-19 VITALS — BP 108/62 | HR 64 | Temp 98.1°F | Resp 12 | Ht 61.0 in | Wt 87.0 lb

## 2014-11-19 DIAGNOSIS — J209 Acute bronchitis, unspecified: Secondary | ICD-10-CM

## 2014-11-19 DIAGNOSIS — J441 Chronic obstructive pulmonary disease with (acute) exacerbation: Secondary | ICD-10-CM

## 2014-11-19 DIAGNOSIS — F0280 Dementia in other diseases classified elsewhere without behavioral disturbance: Secondary | ICD-10-CM

## 2014-11-19 DIAGNOSIS — K5901 Slow transit constipation: Secondary | ICD-10-CM

## 2014-11-19 DIAGNOSIS — R531 Weakness: Secondary | ICD-10-CM | POA: Diagnosis not present

## 2014-11-19 DIAGNOSIS — G309 Alzheimer's disease, unspecified: Secondary | ICD-10-CM | POA: Diagnosis not present

## 2014-11-19 DIAGNOSIS — J439 Emphysema, unspecified: Secondary | ICD-10-CM | POA: Diagnosis not present

## 2014-11-19 MED ORDER — IPRATROPIUM-ALBUTEROL 0.5-2.5 (3) MG/3ML IN SOLN: 3.0000 mL | Freq: Four times a day (QID) | RESPIRATORY_TRACT | Status: DC

## 2014-11-19 NOTE — Progress Notes (Signed)
Patient ID: Bianca Khan, female   DOB: [birth year], 81 y.o.   MRN: [id number]   Location:  [facility]  Code Status: DNR Goals of Care: Advanced Directive information Does patient have an advance directive?: Yes, Type of Advance Directive: [place];Living will;Out of facility DNR (pink MOST or yellow form), Pre-existing out of facility DNR order (yellow form or pink MOST form): Yellow form placed in chart (order not valid for inpatient use), Does patient want to make changes to advanced directive?: No - Patient declined   Chief Complaint  Patient presents with  . Medical Management of Chronic Issues    3 month follow-up, discuss labs (copy printed); f/u on her bronchitis  . Immunizations    Discuss need for TDaP     HPI: Patient is a 81 y.o. white female seen in the office today for med mgt and follow up on bronchitis.  Continues to have wheezing Says her appetite is still poor She is still drinking her shakes.  No other new concerns Using inhaler proair, but not helping much (seems she is not using proper technique and may be too weak to use)  Review of Systems:  Review of Systems  Constitutional:       Gained two more lbs with mighty shakes :)  HENT: Positive for congestion.   Eyes: Negative for blurred vision.  Respiratory: Positive for sputum production and wheezing. Negative for cough and shortness of breath.   Cardiovascular: Negative for chest pain.  Gastrointestinal: Negative for abdominal pain.  Genitourinary: Negative for dysuria, urgency and frequency.  Musculoskeletal: Negative for myalgias and falls.  Neurological: Negative for dizziness.  Psychiatric/Behavioral: Positive for memory loss. Negative for depression. The patient is nervous/anxious.     Past Medical History  Diagnosis Date  . COPD (chronic obstructive pulmonary disease)   . CHF (congestive heart failure)   . Edema   . Myocardial  infarction   . Anxiety   . Hypertension   . Anemia   . Leukocytosis   . Acid reflux   . Osteoporosis   . Sinus drainage   . Weakness   . Unspecified intestinal obstruction   . Unspecified constipation   . Type II or unspecified type diabetes mellitus without mention of complication, not stated as uncontrolled   . Solitary pulmonary nodule   . Other abnormal blood chemistry   . Acute bronchitis   . Adult failure to thrive   . Loss of weight   . Unspecified fall   . Insomnia, unspecified   . Insomnia, unspecified   . Open wound of scalp, without mention of complication   . Atrial fibrillation   . Edema   . Carbuncle and furuncle of other specified sites   . Personal history of methicillin resistant Staphylococcus aureus   . Impacted cerumen   . Chronic airway obstruction, not elsewhere classified   . Coronary atherosclerosis of native coronary artery   . Leukocytosis, unspecified   . Anxiety state, unspecified   . Depressive disorder, not elsewhere classified   . Unspecified essential hypertension   . Cardiomegaly   . Unspecified pleural effusion   . Unspecified pleural effusion   . Esophagitis, unspecified   . Reflux esophagitis   . Duodenal ulcer, unspecified as acute or chronic, without hemorrhage, perforation, or obstruction   . Diaphragmatic hernia without mention of obstruction or gangrene   . Senile osteoporosis   . Abnormal involuntary movements(781.0)   . Abnormality  of gait   . Loss of weight   . Other dyspnea and respiratory abnormality     Past Surgical History  Procedure Laterality Date  . Laparotomy  04/04/2012    Procedure: EXPLORATORY LAPAROTOMY;  Surgeon: Adin Hector, MD;  Location: [place];  Service: General;  Laterality: N/A;  . Abdominal hysterectomy  1991  . Hip surgery    . Fracture surgery  2011    hip    Allergies  Allergen Reactions  . Zoloft [Sertraline Hcl]     Burning in throat    Medications: Patient's Medications  New  Prescriptions   IPRATROPIUM-ALBUTEROL (DUONEB) 0.5-2.5 (3) MG/3ML SOLN    Take 3 mLs by nebulization every 6 (six) hours. While awake for one week  Previous Medications   ALBUTEROL (PROVENTIL HFA;VENTOLIN HFA) 108 (90 BASE) MCG/ACT INHALER    Inhale 2 puffs into the lungs every 6 (six) hours as needed for wheezing or shortness of breath.   ASPIRIN 81 MG TABLET    Take 81 mg by mouth daily.   CALCIUM-VITAMIN D-VITAMIN K (VIACTIV PO)    Take 1 each by mouth 2 (two) times daily.    CARTIA XT 240 MG 24 HR CAPSULE    TAKE 1 CAPSULE DAILY FOR HEART RHYTHM   DOCUSATE SODIUM (COLACE) 100 MG CAPSULE    Take one tablet once daily   DOXYCYCLINE (VIBRA-TABS) 100 MG TABLET    Take 1 tablet (100 mg total) by mouth 2 (two) times daily.   GUAIFENESIN (MUCINEX) 600 MG 12 HR TABLET    Take 1 tablet (600 mg total) by mouth daily after breakfast.   HYDROCHLOROTHIAZIDE (HYDRODIURIL) 25 MG TABLET    TAKE 1 TABLET DAILY FOR EDEMA   HYDROCORTISONE (ANUSOL-HC) 25 MG SUPPOSITORY    Place 1 suppository (25 mg total) rectally daily as needed for hemorrhoids.   MIRTAZAPINE (REMERON) 15 MG TABLET    TAKE 1 TABLET AT BEDTIME   OMEPRAZOLE (PRILOSEC) 20 MG CAPSULE    TAKE 1 CAPSULE DAILY TO REDUCE STOMACH ACID AND TO HELP PROTECT ESOPHAGUS   POLYETHYLENE GLYCOL (MIRALAX / GLYCOLAX) PACKET    Take 17 g by mouth daily as needed. constipation   SACCHAROMYCES BOULARDII (FLORASTOR) 250 MG CAPSULE    Take 1 capsule (250 mg total) by mouth 2 (two) times daily.   SENNA-DOCUSATE (SENOKOT-S) 8.6-50 MG PER TABLET    Take 1 tablet by mouth daily as needed for constipation. For constipation   SPIRIVA HANDIHALER 18 MCG INHALATION CAPSULE    INHALE THE CONTENTS OF 1 CAPSULE ONCE DAILY   VITAMIN D, ERGOCALCIFEROL, (DRISDOL) 50000 UNITS CAPS CAPSULE    Take 1 capsule (50,000 Units total) by mouth every 7 (seven) days.  Modified Medications   No medications on file  Discontinued Medications   No medications on file    Physical Exam: Filed  Vitals:   11/19/14 1310  BP: 108/62  Pulse: 64  Temp: 98.1 F (36.7 C)  TempSrc: Oral  Resp: 12  Height: 5\' 1"  (1.549 m)  Weight: 87 lb (39.463 kg)   Physical Exam  Constitutional:  Thin white female   Cardiovascular: Normal rate, regular rhythm, normal heart sounds and intact distal pulses.   Pulmonary/Chest: Effort normal. No respiratory distress. She has no wheezes.  Few rhonchi remain  Abdominal: Soft. Bowel sounds are normal.  Musculoskeletal: Normal range of motion.  Walks with slow shuffling gait with her walker  Neurological: She is alert.  Skin: Skin is warm and dry.  Psychiatric: She has a normal mood and affect.    Labs reviewed: Basic Metabolic Panel:  Recent Labs  01/13/14 0822 08/20/14 1422 11/12/14 1454  NA 143 138 141  K 3.8 4.4 3.6  CL 97 92* 96*  CO2 26 29 29   GLUCOSE 136* 119* 118*  BUN 20 16 17   CREATININE 0.94 0.88 0.90  CALCIUM 10.4* 11.2* 10.1   Liver Function Tests:  Recent Labs  01/13/14 0822 08/20/14 1422  AST 16 13  ALT 10 8  ALKPHOS 83 87  BILITOT 0.4 0.5  PROT 6.4 6.7    Recent Labs  08/20/14 1422  LIPASE 14  AMYLASE 35   No results for input(s): AMMONIA in the last 8760 hours. CBC:  Recent Labs  01/13/14 0822 05/19/14 0952 08/20/14 1422 11/12/14 1454  WBC 16.0* 14.6* 23.6* 15.0*  NEUTROABS 9.1* 8.0* 17.4* 8.1*  HGB 14.3 13.5 12.7  --   HCT 41.6 39.7 37.5 40.4  MCV 89 89 89  --   PLT 468* 416* 481*  --    Lipid Panel:  Recent Labs  05/19/14 0952  CHOL 186  HDL 63  LDLCALC 93  TRIG 152*  CHOLHDL 3.0   Lab Results  Component Value Date   HGBA1C 6.6* 08/20/2014   Assessment/Plan 1. Acute bronchitis, unspecified organism -completing her doxycycline (on day 6/10) - ipratropium-albuterol (DUONEB) 0.5-2.5 (3) MG/3ML SOLN; Take 3 mLs by nebulization every 6 (six) hours. While awake for one week  Dispense: 360 mL; Refill: 0  2. Weakness -seems improved from last week though she says she feels no  better  3. Alzheimer's disease -is slowly progressing--short term memory is very poor and leads to lots of confusion at [facility] about her medications  4. Pulmonary emphysema, unspecified emphysema type -cont spiriva and albuterol inhaler, but added nebs due to acute bronchitis  5. Constipation, slow transit -cont current bowel regimen  Next appt:  3 mos for med mgt  Tannie Koskela L. Makoto Sellitto, D.O. [facility] [address], [place] [address] Cell Phone (Mon-Fri 8am-5pm):  [phone] On Call:  [phone] & follow prompts after 5pm & weekends Office Phone:  [phone] Office Fax:  [phone]

## 2014-12-06 ENCOUNTER — Other Ambulatory Visit: Payer: Self-pay | Admitting: Internal Medicine

## 2015-01-15 ENCOUNTER — Other Ambulatory Visit: Payer: Self-pay | Admitting: Internal Medicine

## 2015-02-25 ENCOUNTER — Encounter: Payer: Self-pay | Admitting: Internal Medicine

## 2015-02-25 ENCOUNTER — Ambulatory Visit (INDEPENDENT_AMBULATORY_CARE_PROVIDER_SITE_OTHER): Payer: Medicare Other | Admitting: Internal Medicine

## 2015-02-25 VITALS — BP 130/68 | HR 64 | Temp 97.0°F | Resp 20 | Ht 61.0 in | Wt 88.6 lb

## 2015-02-25 DIAGNOSIS — M549 Dorsalgia, unspecified: Secondary | ICD-10-CM | POA: Insufficient documentation

## 2015-02-25 DIAGNOSIS — K5901 Slow transit constipation: Secondary | ICD-10-CM | POA: Diagnosis not present

## 2015-02-25 DIAGNOSIS — I5032 Chronic diastolic (congestive) heart failure: Secondary | ICD-10-CM

## 2015-02-25 DIAGNOSIS — G250 Essential tremor: Secondary | ICD-10-CM

## 2015-02-25 DIAGNOSIS — J449 Chronic obstructive pulmonary disease, unspecified: Secondary | ICD-10-CM | POA: Diagnosis not present

## 2015-02-25 DIAGNOSIS — F419 Anxiety disorder, unspecified: Secondary | ICD-10-CM | POA: Diagnosis not present

## 2015-02-25 DIAGNOSIS — E1142 Type 2 diabetes mellitus with diabetic polyneuropathy: Secondary | ICD-10-CM

## 2015-02-25 DIAGNOSIS — F0280 Dementia in other diseases classified elsewhere without behavioral disturbance: Secondary | ICD-10-CM

## 2015-02-25 DIAGNOSIS — M546 Pain in thoracic spine: Secondary | ICD-10-CM

## 2015-02-25 DIAGNOSIS — K219 Gastro-esophageal reflux disease without esophagitis: Secondary | ICD-10-CM | POA: Diagnosis not present

## 2015-02-25 DIAGNOSIS — M810 Age-related osteoporosis without current pathological fracture: Secondary | ICD-10-CM | POA: Diagnosis not present

## 2015-02-25 DIAGNOSIS — G309 Alzheimer's disease, unspecified: Secondary | ICD-10-CM

## 2015-02-25 NOTE — Progress Notes (Signed)
Location:  [facility]  Code Status: DNR Goals of Care: Advanced Directive information     No chief complaint on file.   HPI: Patient is a 81 y.o.  seen in the office today for medical management.   She is doing well since her last visit.   Has some pain in her back. Pain is in thoracic area to the left of her spine. Is intermittent 1-2 times per week. She has it briefly but states she just takes an aspirin and it goes away.   Her daughter in law, Colletta [place] is with her and notes no changes except maybe some shortness of breath. Patient doesn't feel like her breathing is difficult.   She did not bring paperwork from [facility] so medications cannot verified. Will request medication list to be faxed.  She has noted trembling in her hands and requests medication for her "nerves". Denies feeling sad or anxious. Just notes her hands trembling.   Appetite is ok- if she likes she will eat it. She is drinking her milkshake three times a day. She is sleeping.   Memory is stable- daughter in law has not noted any changes in cognitive functioning.   Review of Systems:  Review of Systems  Constitutional: Negative for fever, chills and weight loss.  Eyes: Negative for blurred vision and double vision.  Cardiovascular: Negative for chest pain, palpitations and orthopnea.  Gastrointestinal: Negative for heartburn, nausea, vomiting and abdominal pain.  Genitourinary: Negative for dysuria, urgency and frequency.  Musculoskeletal: Positive for back pain. Negative for myalgias.  Skin: Negative for rash.  Neurological: Positive for tremors.  Psychiatric/Behavioral: Positive for memory loss. Negative for depression. The patient is not nervous/anxious.     Past Medical History  Diagnosis Date  . COPD (chronic obstructive pulmonary disease)   . CHF (congestive heart failure)   . Edema   . Myocardial infarction   . Anxiety   . Hypertension   .  Anemia   . Leukocytosis   . Acid reflux   . Osteoporosis   . Sinus drainage   . Weakness   . Unspecified intestinal obstruction   . Unspecified constipation   . Type II or unspecified type diabetes mellitus without mention of complication, not stated as uncontrolled   . Solitary pulmonary nodule   . Other abnormal blood chemistry   . Acute bronchitis   . Adult failure to thrive   . Loss of weight   . Unspecified fall   . Insomnia, unspecified   . Insomnia, unspecified   . Open wound of scalp, without mention of complication   . Atrial fibrillation   . Edema   . Carbuncle and furuncle of other specified sites   . Personal history of methicillin resistant Staphylococcus aureus   . Impacted cerumen   . Chronic airway obstruction, not elsewhere classified   . Coronary atherosclerosis of native coronary artery   . Leukocytosis, unspecified   . Anxiety state, unspecified   . Depressive disorder, not elsewhere classified   . Unspecified essential hypertension   . Cardiomegaly   . Unspecified pleural effusion   . Unspecified pleural effusion   . Esophagitis, unspecified   . Reflux esophagitis   . Duodenal ulcer, unspecified as acute or chronic, without hemorrhage, perforation, or obstruction   . Diaphragmatic hernia without mention of obstruction or gangrene   . Senile osteoporosis   . Abnormal involuntary movements(781.0)   . Abnormality of gait   . Loss  of weight   . Other dyspnea and respiratory abnormality     Past Surgical History  Procedure Laterality Date  . Laparotomy  04/04/2012    Procedure: EXPLORATORY LAPAROTOMY;  Surgeon: Adin Hector, MD;  Location: [place];  Service: General;  Laterality: N/A;  . Abdominal hysterectomy  1991  . Hip surgery    . Fracture surgery  2011    hip    Allergies  Allergen Reactions  . Zoloft [Sertraline Hcl]     Burning in throat    Medications: Patient's Medications  New Prescriptions   No medications on file  Previous  Medications   ALBUTEROL (PROVENTIL HFA;VENTOLIN HFA) 108 (90 BASE) MCG/ACT INHALER    Inhale 2 puffs into the lungs every 6 (six) hours as needed for wheezing or shortness of breath.   ASPIRIN 81 MG TABLET    Take 81 mg by mouth daily.   CALCIUM-VITAMIN D-VITAMIN K (VIACTIV PO)    Take 1 each by mouth 2 (two) times daily.    CARTIA XT 240 MG 24 HR CAPSULE    TAKE 1 CAPSULE DAILY FOR HEART RHYTHM   DOCUSATE SODIUM (COLACE) 100 MG CAPSULE    Take one tablet once daily   DOXYCYCLINE (VIBRA-TABS) 100 MG TABLET    Take 1 tablet (100 mg total) by mouth 2 (two) times daily.   GUAIFENESIN (MUCINEX) 600 MG 12 HR TABLET    Take 1 tablet (600 mg total) by mouth daily after breakfast.   HYDROCHLOROTHIAZIDE (HYDRODIURIL) 25 MG TABLET    TAKE 1 TABLET DAILY FOR EDEMA   HYDROCORTISONE (ANUSOL-HC) 25 MG SUPPOSITORY    Place 1 suppository (25 mg total) rectally daily as needed for hemorrhoids.   IPRATROPIUM-ALBUTEROL (DUONEB) 0.5-2.5 (3) MG/3ML SOLN    Take 3 mLs by nebulization every 6 (six) hours. While awake for one week   MIRTAZAPINE (REMERON) 15 MG TABLET    TAKE 1 TABLET AT BEDTIME   OMEPRAZOLE (PRILOSEC) 20 MG CAPSULE    TAKE 1 CAPSULE DAILY TO REDUCE STOMACH ACID AND TO HELP PROTECT ESOPHAGUS   POLYETHYLENE GLYCOL (MIRALAX / GLYCOLAX) PACKET    Take 17 g by mouth daily as needed. constipation   SACCHAROMYCES BOULARDII (FLORASTOR) 250 MG CAPSULE    Take 1 capsule (250 mg total) by mouth 2 (two) times daily.   SENNA-DOCUSATE (SENOKOT-S) 8.6-50 MG PER TABLET    Take 1 tablet by mouth daily as needed for constipation. For constipation   SPIRIVA HANDIHALER 18 MCG INHALATION CAPSULE    INHALE THE CONTENTS OF 1 CAPSULE ONCE DAILY   VITAMIN D, ERGOCALCIFEROL, (DRISDOL) 50000 UNITS CAPS CAPSULE    Take 1 capsule (50,000 Units total) by mouth every 7 (seven) days.  Modified Medications   No medications on file  Discontinued Medications   No medications on file    Physical Exam: Filed Vitals:   02/25/15 1331    BP: 130/68  Pulse: 64  Temp: 97 F (36.1 C)  TempSrc: Oral  Resp: 20  Height: 5\' 1"  (1.549 m)  Weight: 88 lb 9.6 oz (40.189 kg)  SpO2: 94%   Physical Exam  Constitutional:  Thin, frail  HENT:  Head: Normocephalic and atraumatic.  Cardiovascular: Normal rate, regular rhythm, normal heart sounds and intact distal pulses.   Pulmonary/Chest: Effort normal. No respiratory distress. She has wheezes. She has no rales.  Abdominal: Soft. Bowel sounds are normal. There is no tenderness.  Musculoskeletal: Normal range of motion. She exhibits edema.  Bilateral pedal edema,  thoracic kyphosis, uses a walker  Neurological: She is alert.  tremors, cognitive and memory deficits  Skin: Skin is warm and dry.  Psychiatric: She has a normal mood and affect. Her behavior is normal.  Vitals reviewed.   Labs reviewed: Basic Metabolic Panel:  Recent Labs  08/20/14 1422 11/12/14 1454  NA 138 141  K 4.4 3.6  CL 92* 96*  CO2 29 29  GLUCOSE 119* 118*  BUN 16 17  CREATININE 0.88 0.90  CALCIUM 11.2* 10.1   Liver Function Tests:  Recent Labs  08/20/14 1422  AST 13  ALT 8  ALKPHOS 87  BILITOT 0.5  PROT 6.7    Recent Labs  08/20/14 1422  LIPASE 14  AMYLASE 35   No results for input(s): AMMONIA in the last 8760 hours. CBC:  Recent Labs  05/19/14 0952 08/20/14 1422 11/12/14 1454  WBC 14.6* 23.6* 15.0*  NEUTROABS 8.0* 17.4* 8.1*  HGB 13.5 12.7  --   HCT 39.7 37.5 40.4  MCV 89 89  --   PLT 416* 481*  --    Lipid Panel:  Recent Labs  05/19/14 0952  CHOL 186  HDL 63  LDLCALC 93  TRIG 152*  CHOLHDL 3.0   Lab Results  Component Value Date   HGBA1C 6.6* 08/20/2014    Procedures since last visit: none  Assessment/Plan 1. Essential tremor- will not treat with medication due to possible negative side effect of hypotension and patient's high risk for falls. Patient understood the risk and agreed.  2. Chronic obstructive pulmonary disease, unspecified COPD,  unspecified chronic bronchitis type - controlled with current medications. Some wheezing noted today, encouraged patient and daughter in law to ask nursing for the PRN albuterol. Obtained medication list from nursing facility and verified albuterol order PRN is still active.  3. Chronic diastolic congestive heart failure, NYHA class 1- controlled with current medications. Continue medications as ordered.  4. Constipation, slow transit- controlled with current regimen. Continue medications as ordered.  5. Type 2 diabetes mellitus with diabetic polyneuropathy- controlled currently without medications. Recheck HgbA1C at next visit.  6. Alzheimer's disease- no recent changes in functioning. Continue current medications and residence in nursing facility.  7. Anxiety- improved since last visit. Doing well off Zoloft.  8. Gastroesophageal reflux disease, esophagitis presence not specified-  Controlled with current medications. Continue medications as ordered.  9. Senile osteoporosis- declines bone density test. Continue to monitor for complications. Continue calcium and vit D supplementation.  10. Left-sided thoracic back pain- questionable etiology. May be muscular or possible compression fracture r/t osteoporosis. Currently manageable with as needed analgesic. Continue to monitor.    Labs/tests ordered: Next appt: Follow up with labs in 3 mths with Dr. Mariea Clonts.  Mariana Kaufman, RN, BSN [employer] Nurse Practitioner- Student Geriatrics [facility] [address], [place] [address] Office Phone:  [phone] Office Fax:  [phone]

## 2015-03-03 ENCOUNTER — Other Ambulatory Visit: Payer: Self-pay | Admitting: Internal Medicine

## 2015-03-06 ENCOUNTER — Other Ambulatory Visit: Payer: Self-pay | Admitting: Internal Medicine

## 2015-03-15 ENCOUNTER — Other Ambulatory Visit: Payer: Self-pay | Admitting: Internal Medicine

## 2015-03-23 ENCOUNTER — Ambulatory Visit
Admission: RE | Admit: 2015-03-23 | Discharge: 2015-03-23 | Disposition: A | Discharge status: Home / Self Care | Payer: Medicare Other | Source: Ambulatory Visit | Attending: Nurse Practitioner | Admitting: Nurse Practitioner

## 2015-03-23 ENCOUNTER — Ambulatory Visit (INDEPENDENT_AMBULATORY_CARE_PROVIDER_SITE_OTHER): Payer: Medicare Other | Admitting: Nurse Practitioner

## 2015-03-23 ENCOUNTER — Encounter: Payer: Self-pay | Admitting: Nurse Practitioner

## 2015-03-23 VITALS — BP 110/66 | HR 64 | Temp 98.1°F | Resp 12 | Ht 61.0 in | Wt 89.0 lb

## 2015-03-23 DIAGNOSIS — M4185 Other forms of scoliosis, thoracolumbar region: Secondary | ICD-10-CM | POA: Diagnosis not present

## 2015-03-23 DIAGNOSIS — Z23 Encounter for immunization: Secondary | ICD-10-CM | POA: Diagnosis not present

## 2015-03-23 DIAGNOSIS — M546 Pain in thoracic spine: Secondary | ICD-10-CM

## 2015-03-23 DIAGNOSIS — J449 Chronic obstructive pulmonary disease, unspecified: Secondary | ICD-10-CM | POA: Diagnosis not present

## 2015-03-23 MED ORDER — IPRATROPIUM-ALBUTEROL 0.5-2.5 (3) MG/3ML IN SOLN: 3.0000 mL | Freq: Four times a day (QID) | RESPIRATORY_TRACT | Status: DC

## 2015-03-23 MED ORDER — TIOTROPIUM BROMIDE MONOHYDRATE 2.5 MCG/ACT IN AERS: INHALATION_SPRAY | RESPIRATORY_TRACT | Status: DC

## 2015-03-23 NOTE — Patient Instructions (Addendum)
To use asper- cream TID to affected area for 1 week then every 6 hours as needed for pain To use tylenol 650 mg every 4 hours as needed for pain To get xray of back  Call if pain worsens, becomes more constant    Will also change spiriva to respimat for better inhalation  To use duonebs every 6 hours as needed for wheezing/shortness of breath To use ventolin only when out of facility if she needs inhaler

## 2015-03-23 NOTE — Progress Notes (Signed)
Patient ID: Bianca Khan, female   DOB: 07/23/2014, 81 y.o.   MRN: [id number]    PCP: Hollace Kinnier, DO   Allergies  Allergen Reactions  . Zoloft [Sertraline Hcl]     Burning in throat     Chief Complaint  Patient presents with  . Acute Visit    Back pain x 1 month or longer (see last OV note). Pain is on the right upper side. Pain is relieved with OTC Aleve.   . Immunizations    Flu Vaccine today     HPI: Patient is a 81 y.o. female seen in the office today due to ongoing upper back pain. Does not hurt all the time, goes and comes. Starts upper back around neck and goes down the right side to the middle of her back. Pain is 8/10. Takes aleve which takes pain completely away. Bothered by pain about 3 days a week. Has gotten worse in the last week or 2.  daughter in law at visit and reports pain has been going on for the past 4 weeks. Does not radiate down her arm.  No weakness to hands, does not effect grips.  Worse when she gets up and moves, can sit down and it improves. Tylenol does not help as good as the aleve.   Wheezing today during OV, denies shortness of breath or worsening cough or congestion. Question if she is able to use inhalers correctly due to dementia. Not asking for as needed neb/inhaler.    Review of Systems:  Review of Systems  Constitutional: Negative for fever and chills.  Respiratory: Negative for shortness of breath and wheezing.   Cardiovascular: Negative for chest pain and palpitations.  Gastrointestinal: Negative for nausea, vomiting and abdominal pain.  Genitourinary: Negative for dysuria, urgency and frequency.  Musculoskeletal: Positive for back pain. Negative for myalgias.  Skin: Negative for rash.  Psychiatric/Behavioral: The patient is not nervous/anxious.     Past Medical History  Diagnosis Date  . COPD (chronic obstructive pulmonary disease)   . CHF (congestive heart failure)   . Edema   . Myocardial infarction   . Anxiety   .  Hypertension   . Anemia   . Leukocytosis   . Acid reflux   . Osteoporosis   . Sinus drainage   . Weakness   . Unspecified intestinal obstruction   . Unspecified constipation   . Type II or unspecified type diabetes mellitus without mention of complication, not stated as uncontrolled   . Solitary pulmonary nodule   . Other abnormal blood chemistry   . Acute bronchitis   . Adult failure to thrive   . Loss of weight   . Unspecified fall   . Insomnia, unspecified   . Insomnia, unspecified   . Open wound of scalp, without mention of complication   . Atrial fibrillation   . Edema   . Carbuncle and furuncle of other specified sites   . Personal history of methicillin resistant Staphylococcus aureus   . Impacted cerumen   . Chronic airway obstruction, not elsewhere classified   . Coronary atherosclerosis of native coronary artery   . Leukocytosis, unspecified   . Anxiety state, unspecified   . Depressive disorder, not elsewhere classified   . Unspecified essential hypertension   . Cardiomegaly   . Unspecified pleural effusion   . Unspecified pleural effusion   . Esophagitis, unspecified   . Reflux esophagitis   . Duodenal ulcer, unspecified as acute or chronic, without hemorrhage, perforation, or  obstruction   . Diaphragmatic hernia without mention of obstruction or gangrene   . Senile osteoporosis   . Abnormal involuntary movements(781.0)   . Abnormality of gait   . Loss of weight   . Other dyspnea and respiratory abnormality    Past Surgical History  Procedure Laterality Date  . Laparotomy  04/04/2012    Procedure: EXPLORATORY LAPAROTOMY;  Surgeon: Adin Hector, MD;  Location: [place];  Service: General;  Laterality: N/A;  . Abdominal hysterectomy  1991  . Hip surgery    . Fracture surgery  2011    hip   Social History:   reports that she has never smoked. She has never used smokeless tobacco. She reports that she drinks alcohol. She reports that she does not use  illicit drugs.  Family History  Problem Relation Age of Onset  . Stroke Father     Medications: Patient's Medications  New Prescriptions   No medications on file  Previous Medications   ALBUTEROL (PROVENTIL HFA;VENTOLIN HFA) 108 (90 BASE) MCG/ACT INHALER    Inhale 2 puffs into the lungs every 6 (six) hours as needed for wheezing or shortness of breath.   ASPIRIN 81 MG TABLET    Take 81 mg by mouth daily.   CALCIUM-VITAMIN D-VITAMIN K (VIACTIV PO)    Take 1 each by mouth 2 (two) times daily.    DILTIAZEM (CARDIZEM CD) 240 MG 24 HR CAPSULE    TAKE 1 CAP BY MOUTH ONCE DAILY   DOCUSATE SODIUM (COLACE) 100 MG CAPSULE    Take one tablet once daily   GUAIFENESIN (MUCINEX) 600 MG 12 HR TABLET    Take 1 tablet (600 mg total) by mouth daily after breakfast.   HYDROCHLOROTHIAZIDE (HYDRODIURIL) 25 MG TABLET    TAKE 1 TABLET DAILY FOR EDEMA   HYDROCORTISONE (ANUSOL-HC) 25 MG SUPPOSITORY    Place 1 suppository (25 mg total) rectally daily as needed for hemorrhoids.   IPRATROPIUM-ALBUTEROL (DUONEB) 0.5-2.5 (3) MG/3ML SOLN    Take 3 mLs by nebulization every 6 (six) hours. While awake for one week   MIRTAZAPINE (REMERON) 15 MG TABLET    TAKE 1 TABLET AT BEDTIME   OMEPRAZOLE (PRILOSEC) 20 MG CAPSULE    TAKE 1 CAPSULE DAILY TO REDUCE STOMACH ACID AND TO HELP PROTECT ESOPHAGUS   POLYETHYLENE GLYCOL (MIRALAX / GLYCOLAX) PACKET    Take 17 g by mouth daily as needed. constipation   SENEXON-S 8.6-50 MG PER TABLET    TAKE 1 TAB BY MOUTH EVERY DAY AS NEEDED FOR CONSTIPATION   SPIRIVA HANDIHALER 18 MCG INHALATION CAPSULE    INHALE THE CONTENTS OF 1 CAPSULE ONCE DAILY   VITAMIN D, ERGOCALCIFEROL, (DRISDOL) 50000 UNITS CAPS CAPSULE    Take 1 capsule (50,000 Units total) by mouth every 7 (seven) days.  Modified Medications   No medications on file  Discontinued Medications   DOXYCYCLINE (VIBRA-TABS) 100 MG TABLET    Take 1 tablet (100 mg total) by mouth 2 (two) times daily.     Physical Exam:  Filed Vitals:     03/23/15 1338  BP: 110/66  Pulse: 64  Temp: 98.1 F (36.7 C)  TempSrc: Oral  Resp: 12  Height: 5\' 1"  (1.549 m)  Weight: 89 lb (40.37 kg)   Body mass index is 16.83 kg/(m^2).  Physical Exam  Constitutional:  Thin, frail  HENT:  Head: Normocephalic and atraumatic.  Cardiovascular: Normal rate, regular rhythm, normal heart sounds and intact distal pulses.   Pulmonary/Chest: Effort normal.  No respiratory distress. She has wheezes (throughout). She has no rales.  Abdominal: Soft. Bowel sounds are normal. There is no tenderness.  Musculoskeletal: Normal range of motion. She exhibits no edema.  thoracic kyphosis without tenderness on exam  Neurological: She is alert.  cognitive and memory deficits  Skin: Skin is warm and dry.  Psychiatric: She has a normal mood and affect. Her behavior is normal.   Labs reviewed: Basic Metabolic Panel:  Recent Labs  08/20/14 1422 11/12/14 1454  NA 138 141  K 4.4 3.6  CL 92* 96*  CO2 29 29  GLUCOSE 119* 118*  BUN 16 17  CREATININE 0.88 0.90  CALCIUM 11.2* 10.1   Liver Function Tests:  Recent Labs  08/20/14 1422  AST 13  ALT 8  ALKPHOS 87  BILITOT 0.5  PROT 6.7    Recent Labs  08/20/14 1422  LIPASE 14  AMYLASE 35   No results for input(s): AMMONIA in the last 8760 hours. CBC:  Recent Labs  05/19/14 0952 08/20/14 1422 11/12/14 1454  WBC 14.6* 23.6* 15.0*  NEUTROABS 8.0* 17.4* 8.1*  HGB 13.5 12.7  --   HCT 39.7 37.5 40.4  MCV 89 89  --   PLT 416* 481*  --    Lipid Panel:  Recent Labs  05/19/14 0952  CHOL 186  HDL 63  LDLCALC 93  TRIG 152*  CHOLHDL 3.0   TSH: No results for input(s): TSH in the last 8760 hours. A1C: Lab Results  Component Value Date   HGBA1C 6.6* 08/20/2014     Assessment/Plan 1. Right-sided thoracic back pain -ongoing but not daily, uses aleve with total relief. Discourage use of aleve and may use tylenol as needed  - to use aspercream TID for 1 week then as needed - DG  Thoracic Spine W/Swimmers to rule out fracture.   2. Chronic obstructive pulmonary disease, unspecified COPD, unspecified chronic bronchitis type -unable to use inhalers, will change Spiriva to respimat and to use duoneb if needed  - Tiotropium Bromide Monohydrate (SPIRIVA RESPIMAT) 2.5 MCG/ACT AERS; 2 puffs daily into lungs for COPD  Dispense: 4 g; Refill: 4 - ipratropium-albuterol (DUONEB) 0.5-2.5 (3) MG/3ML SOLN; Take 3 mLs by nebulization every 6 (six) hours.  Dispense: 360 mL; Refill: 2  3. Encounter for immunization Influenza vaccine given  Carlos American. Harle Battiest  [facility] [phone] 8 am - 5 pm) [phone] (after hours)

## 2015-03-26 DIAGNOSIS — M810 Age-related osteoporosis without current pathological fracture: Secondary | ICD-10-CM | POA: Diagnosis not present

## 2015-03-26 DIAGNOSIS — S32000S Wedge compression fracture of unspecified lumbar vertebra, sequela: Secondary | ICD-10-CM | POA: Diagnosis not present

## 2015-03-26 DIAGNOSIS — J449 Chronic obstructive pulmonary disease, unspecified: Secondary | ICD-10-CM | POA: Diagnosis not present

## 2015-03-30 ENCOUNTER — Telehealth: Payer: Self-pay | Admitting: *Deleted

## 2015-03-30 NOTE — Telephone Encounter (Signed)
Patient caregiver, Colletta Maryland called requesting Chest X-Ray Report. We have not received Report. Caregiver stated that she is going to call facility and have them fax to [place].

## 2015-03-31 ENCOUNTER — Telehealth: Payer: Self-pay

## 2015-03-31 NOTE — Telephone Encounter (Signed)
[facility] should have communicated those results to Bianca Khan after they spoke with the on call provider.  It does not sound like she has any new/acute findings that warranted intervention.

## 2015-03-31 NOTE — Telephone Encounter (Signed)
Patients daughter informed of Dr.Reed's response

## 2015-03-31 NOTE — Telephone Encounter (Signed)
Xray Results received and placed in Dr.Reed Basket for further review  Nurse from [facility] called stating she talked with on-call doctor over the weekend and was told nothing to be concerned about and patient should further follow-up with PCP.  Patient's daughter call and was a little upset due to not hearing about results. Patient had xray Friday and now its Wednesday and patient has not heard anything. Patient with pending appointment tomorrow.  Impression stated:  COPD noted. Heart slightly enlarged with no CHF  Osteoporosis Several compression fractures noted  Please advise if the patient should have instructions prior to appointment

## 2015-04-01 ENCOUNTER — Encounter: Payer: Self-pay | Admitting: Internal Medicine

## 2015-04-01 ENCOUNTER — Ambulatory Visit (INDEPENDENT_AMBULATORY_CARE_PROVIDER_SITE_OTHER): Payer: Medicare Other | Admitting: Internal Medicine

## 2015-04-01 VITALS — BP 118/70 | HR 63 | Temp 97.4°F | Ht 61.0 in | Wt 88.4 lb

## 2015-04-01 DIAGNOSIS — R208 Other disturbances of skin sensation: Secondary | ICD-10-CM | POA: Diagnosis not present

## 2015-04-01 DIAGNOSIS — J209 Acute bronchitis, unspecified: Secondary | ICD-10-CM | POA: Diagnosis not present

## 2015-04-01 DIAGNOSIS — F411 Generalized anxiety disorder: Secondary | ICD-10-CM

## 2015-04-01 DIAGNOSIS — J449 Chronic obstructive pulmonary disease, unspecified: Secondary | ICD-10-CM | POA: Diagnosis not present

## 2015-04-01 DIAGNOSIS — M546 Pain in thoracic spine: Secondary | ICD-10-CM

## 2015-04-01 MED ORDER — ALBUTEROL SULFATE HFA 108 (90 BASE) MCG/ACT IN AERS: 2.0000 | INHALATION_SPRAY | Freq: Four times a day (QID) | RESPIRATORY_TRACT | Status: DC | PRN

## 2015-04-01 MED ORDER — SERTRALINE HCL 25 MG PO TABS: 25.0000 mg | ORAL_TABLET | Freq: Every day | ORAL | Status: AC

## 2015-04-01 MED ORDER — LIDOCAINE 5 % EX PTCH: 1.0000 | MEDICATED_PATCH | CUTANEOUS | Status: DC

## 2015-04-01 NOTE — Progress Notes (Signed)
Patient ID: Bianca Khan, female   DOB: 07-Oct-1914, 81 y.o.   MRN: [id number]   Location:  [facility]  Code Status: DNR Goals of Care: Advanced Directive information Does patient have an advance directive?: Yes, Type of Advance Directive: [place], Does patient want to make changes to advanced directive?: No - Patient declined   Chief Complaint  Patient presents with  . Acute Visit    Wheezing, SOB, and nerves     HPI: Patient is a 81 y.o. white female resident of [place] seen in the office today for an acute visit due to wheezing for the past 5-6 days.  She's been more short of breath and nervous also.  She is getting duonebs and was changed to spiriva respimat from regular spiriva.  She is requesting to keep her albuterol in her room b/c staff never offer it to her despite the fact that she cannot remember to ask for it due to her dementia.  Discussed that she cannot use it more than 4x per day.  She's been afebrile.  She feels weak.  She's been more short of breath.  The CXR she had showed only COPD, but no pneumonia 03/26/15.  Results were called to on call over the weekend.  She has prn mucinex, but does not ask for it so she has not had any, but she doesn't need it all of the time only when she has a flare up.  Review of Systems:  Review of Systems  Constitutional: Negative for fever and chills.  HENT: Negative for congestion.   Respiratory: Positive for shortness of breath and wheezing. Negative for cough and sputum production.   Cardiovascular: Negative for chest pain and leg swelling.  Gastrointestinal: Negative for abdominal pain.  Genitourinary: Negative for dysuria, urgency and frequency.  Musculoskeletal: Negative for myalgias and falls.       Generalized weakness  Skin: Negative for rash.  Neurological: Negative for dizziness.  Psychiatric/Behavioral: Positive for depression and memory loss.    Past  Medical History  Diagnosis Date  . COPD (chronic obstructive pulmonary disease)   . CHF (congestive heart failure)   . Edema   . Myocardial infarction   . Anxiety   . Hypertension   . Anemia   . Leukocytosis   . Acid reflux   . Osteoporosis   . Sinus drainage   . Weakness   . Unspecified intestinal obstruction   . Unspecified constipation   . Type II or unspecified type diabetes mellitus without mention of complication, not stated as uncontrolled   . Solitary pulmonary nodule   . Other abnormal blood chemistry   . Acute bronchitis   . Adult failure to thrive   . Loss of weight   . Unspecified fall   . Insomnia, unspecified   . Insomnia, unspecified   . Open wound of scalp, without mention of complication   . Atrial fibrillation   . Edema   . Carbuncle and furuncle of other specified sites   . Personal history of methicillin resistant Staphylococcus aureus   . Impacted cerumen   . Chronic airway obstruction, not elsewhere classified   . Coronary atherosclerosis of native coronary artery   . Leukocytosis, unspecified   . Anxiety state, unspecified   . Depressive disorder, not elsewhere classified   . Unspecified essential hypertension   . Cardiomegaly   . Unspecified pleural effusion   . Unspecified pleural effusion   . Esophagitis,  unspecified   . Reflux esophagitis   . Duodenal ulcer, unspecified as acute or chronic, without hemorrhage, perforation, or obstruction   . Diaphragmatic hernia without mention of obstruction or gangrene   . Senile osteoporosis   . Abnormal involuntary movements(781.0)   . Abnormality of gait   . Loss of weight   . Other dyspnea and respiratory abnormality     Past Surgical History  Procedure Laterality Date  . Laparotomy  04/04/2012    Procedure: EXPLORATORY LAPAROTOMY;  Surgeon: Adin Hector, MD;  Location: [place];  Service: General;  Laterality: N/A;  . Abdominal hysterectomy  1991  . Hip surgery    . Fracture surgery  2011     hip    No Active Allergies Medications: Patient's Medications  New Prescriptions   LIDOCAINE (LIDODERM) 5 %    Place 1 patch onto the skin daily. To right shoulder; Remove & Discard patch within 12 hours or as directed by MD   SERTRALINE (ZOLOFT) 25 MG TABLET    Take 1 tablet (25 mg total) by mouth daily.  Previous Medications   ACETAMINOPHEN (TYLENOL) 325 MG TABLET    Take 650 mg by mouth every 4 (four) hours as needed.   ASPIRIN 81 MG TABLET    Take 81 mg by mouth daily.   DILTIAZEM (CARDIZEM CD) 240 MG 24 HR CAPSULE    TAKE 1 CAP BY MOUTH ONCE DAILY   DOCUSATE SODIUM (COLACE) 100 MG CAPSULE    Take one tablet once daily   GUAIFENESIN (MUCINEX) 600 MG 12 HR TABLET    Take 1 tablet (600 mg total) by mouth daily after breakfast.   HYDROCHLOROTHIAZIDE (HYDRODIURIL) 25 MG TABLET    TAKE 1 TABLET DAILY FOR EDEMA   HYDROCORTISONE (ANUSOL-HC) 25 MG SUPPOSITORY    Place 1 suppository (25 mg total) rectally daily as needed for hemorrhoids.   IPRATROPIUM-ALBUTEROL (DUONEB) 0.5-2.5 (3) MG/3ML SOLN    Take 3 mLs by nebulization every 6 (six) hours.   MIRTAZAPINE (REMERON) 15 MG TABLET    TAKE 1 TABLET AT BEDTIME   OMEPRAZOLE (PRILOSEC) 20 MG CAPSULE    TAKE 1 CAPSULE DAILY TO REDUCE STOMACH ACID AND TO HELP PROTECT ESOPHAGUS   POLYETHYLENE GLYCOL (MIRALAX / GLYCOLAX) PACKET    Take 17 g by mouth daily as needed. constipation   PROTEIN SUPPLEMENT (RESOURCE BENEPROTEIN) POWD    Take 1 scoop by mouth 3 (three) times daily with meals.   PSYLLIUM (METAMUCIL) 58.6 % POWDER    Take 1 packet by mouth daily.   SENEXON-S 8.6-50 MG PER TABLET    TAKE 1 TAB BY MOUTH EVERY DAY AS NEEDED FOR CONSTIPATION   TIOTROPIUM BROMIDE MONOHYDRATE (SPIRIVA RESPIMAT) 2.5 MCG/ACT AERS    2 puffs daily into lungs for COPD   TROLAMINE SALICYLATE (ASPERCREME) 10 % CREAM    Apply 1 application topically 3 (three) times daily. X 1 week then every 4 hours as needed   VITAMIN D, ERGOCALCIFEROL, (DRISDOL) 50000 UNITS CAPS CAPSULE     Take 1 capsule (50,000 Units total) by mouth every 7 (seven) days.  Modified Medications   Modified Medication Previous Medication   ALBUTEROL (PROVENTIL HFA;VENTOLIN HFA) 108 (90 BASE) MCG/ACT INHALER albuterol (PROVENTIL HFA;VENTOLIN HFA) 108 (90 BASE) MCG/ACT inhaler      Inhale 2 puffs into the lungs every 6 (six) hours as needed for wheezing or shortness of breath.    Inhale 2 puffs into the lungs every 6 (six) hours as needed for  wheezing or shortness of breath.  Discontinued Medications   CALCIUM-VITAMIN D-VITAMIN K (VIACTIV PO)    Take 1 each by mouth 2 (two) times daily.     Physical Exam: Filed Vitals:   04/01/15 1114  BP: 118/70  Pulse: 63  Temp: 97.4 F (36.3 C)  TempSrc: Oral  Height: 5\' 1"  (1.549 m)  Weight: 88 lb 6.4 oz (40.098 kg)  SpO2: 97%   Physical Exam  Constitutional:  Frail white female  Cardiovascular: Normal rate, regular rhythm, normal heart sounds and intact distal pulses.   Pulmonary/Chest: Effort normal. No respiratory distress. She has wheezes. She has no rales.  Abdominal: Soft. Bowel sounds are normal.  Musculoskeletal:  Walks with walker   Neurological: She is alert.  Poor short term memory  Skin: Skin is warm and dry.    Labs reviewed: Basic Metabolic Panel:  Recent Labs  08/20/14 1422 11/12/14 1454  NA 138 141  K 4.4 3.6  CL 92* 96*  CO2 29 29  GLUCOSE 119* 118*  BUN 16 17  CREATININE 0.88 0.90  CALCIUM 11.2* 10.1   Liver Function Tests:  Recent Labs  08/20/14 1422  AST 13  ALT 8  ALKPHOS 87  BILITOT 0.5  PROT 6.7    Recent Labs  08/20/14 1422  LIPASE 14  AMYLASE 35   No results for input(s): AMMONIA in the last 8760 hours. CBC:  Recent Labs  05/19/14 0952 08/20/14 1422 11/12/14 1454  WBC 14.6* 23.6* 15.0*  NEUTROABS 8.0* 17.4* 8.1*  HGB 13.5 12.7  --   HCT 39.7 37.5 40.4  MCV 89 89  --   PLT 416* 481*  --    Lipid Panel:  Recent Labs  05/19/14 0952  CHOL 186  HDL 63  LDLCALC 93  TRIG 152*    CHOLHDL 3.0   Lab Results  Component Value Date   HGBA1C 6.6* 08/20/2014   Assessment/Plan 1. Acute bronchitis, unspecified organism - will cont duonebs, add albuterol to keep in her room and use no more than 4x per day with a spacer - albuterol (PROVENTIL HFA;VENTOLIN HFA) 108 (90 BASE) MCG/ACT inhaler; Inhale 2 puffs into the lungs every 6 (six) hours as needed for wheezing or shortness of breath.  Dispense: 1 Inhaler; Refill: 11 -also continue spiriva respimat -add scheduled mucinex twice a day since staff are not offering the prn meds to her despite her dementia  2. Anxiety state - tremor worse lately--explained it's probably due to increased use of albuterol and duonebs, but she is adamant that her tremor is due to her nerves -it's also true that about 6 mos ago, we stopped her zoloft b/c she claimed it was causing her itchy throat--she immediately became more worried and anxious and her daughter in law and I both were well aware at the following visit, but pt wouldn't restart at first--she now agrees to  - sertraline (ZOLOFT) 25 MG tablet; Take 1 tablet (25 mg total) by mouth daily.  Dispense: 30 tablet; Refill: 3  3. Allodynia - of right posterior shoulder - lidocaine (LIDODERM) 5 %; Place 1 patch onto the skin daily. To right shoulder; Remove & Discard patch within 12 hours or as directed by MD  Dispense: 30 patch; Refill: 1  4. Right-sided thoracic back pain - seems muscular in origin -will try topical agent -if lidoderm is not covered, would try flector patch for the pain  5. Chronic obstructive pulmonary disease, unspecified COPD, unspecified chronic bronchitis type -recently worse, but  no pneumonia on xray, afebrile, eating and drinking unchanged  Next appt:  Keep regular visit  Beretta Ginsberg L. Aideliz Garmany, D.O. [facility] [address], [place] [address] Cell Phone (Mon-Fri 8am-5pm):  [phone] On Call:  [phone] &  follow prompts after 5pm & weekends Office Phone:  [phone] Office Fax:  [phone]

## 2015-04-02 ENCOUNTER — Telehealth: Payer: Self-pay | Admitting: *Deleted

## 2015-04-02 NOTE — Telephone Encounter (Signed)
Received a fax order from [facility] stating resident very confused and disoriented this morning and wanting an order for UA/CS. Given to Dr. Eulas Post to review and sign.

## 2015-04-05 DIAGNOSIS — N390 Urinary tract infection, site not specified: Secondary | ICD-10-CM | POA: Diagnosis not present

## 2015-04-06 NOTE — Telephone Encounter (Signed)
Received Preliminary Report from [facility] # [id number] for U/A. Culture is Pending. Given Preliminary to Janett Billow to review.

## 2015-04-14 ENCOUNTER — Telehealth: Payer: Self-pay

## 2015-04-14 NOTE — Telephone Encounter (Signed)
Order received from [facility] indicating patient states pain has stopped and would like order for Lidoderm changed to PRN vs daily. Order placed in Dr.Reed's folder for review on 04/15/15 (next day in office). Order will require a physician response and then to be faxed back to [phone]

## 2015-04-15 DIAGNOSIS — R6889 Other general symptoms and signs: Secondary | ICD-10-CM | POA: Diagnosis not present

## 2015-04-15 DIAGNOSIS — N390 Urinary tract infection, site not specified: Secondary | ICD-10-CM | POA: Diagnosis not present

## 2015-04-16 ENCOUNTER — Telehealth: Payer: Self-pay | Admitting: *Deleted

## 2015-04-16 NOTE — Telephone Encounter (Signed)
Received Preliminary Report for Urine Culture from [facility] [phone] and given to Dr. Mariea Clonts to review.

## 2015-04-27 ENCOUNTER — Other Ambulatory Visit: Payer: Self-pay | Admitting: *Deleted

## 2015-04-27 DIAGNOSIS — J449 Chronic obstructive pulmonary disease, unspecified: Secondary | ICD-10-CM

## 2015-04-27 MED ORDER — IPRATROPIUM-ALBUTEROL 0.5-2.5 (3) MG/3ML IN SOLN: RESPIRATORY_TRACT | Status: DC

## 2015-04-27 NOTE — Telephone Encounter (Signed)
Bianca Khan

## 2015-05-03 ENCOUNTER — Encounter: Payer: Self-pay | Admitting: Internal Medicine

## 2015-05-03 ENCOUNTER — Ambulatory Visit (INDEPENDENT_AMBULATORY_CARE_PROVIDER_SITE_OTHER): Payer: Medicare Other | Admitting: Internal Medicine

## 2015-05-03 VITALS — HR 62 | Temp 97.9°F | Resp 20 | Ht 61.0 in | Wt 87.2 lb

## 2015-05-03 DIAGNOSIS — J209 Acute bronchitis, unspecified: Secondary | ICD-10-CM

## 2015-05-03 DIAGNOSIS — B370 Candidal stomatitis: Secondary | ICD-10-CM

## 2015-05-03 DIAGNOSIS — J449 Chronic obstructive pulmonary disease, unspecified: Secondary | ICD-10-CM | POA: Diagnosis not present

## 2015-05-03 MED ORDER — NYSTATIN 100000 UNIT/ML MT SUSP: 5.0000 mL | Freq: Four times a day (QID) | OROMUCOSAL | Status: DC

## 2015-05-03 NOTE — Progress Notes (Signed)
Patient ID: Bianca Khan, female   DOB: 1914/12/15, 81 y.o.   MRN: [id number]   Location: [facility] Provider: Rexene Edison. Mariea Clonts, D.O., C.M.D.  Code Status: DNR Goals of Care: Advanced Directive information Does patient have an advance directive?: Yes  Chief Complaint  Patient presents with  . Acute Visit    Wheezing, Weak, Not doing well past 9 days, also has sores inside of mouth she thinks it is from inhaler    HPI: Patient is a 81 y.o. female seen in the office today for an acute visit.  Had 9 days of shortness of breath, weakness and wheezing for the past 9 days.  Still dyspneic on exertion.  Her daughter notes she's actually less dyspneic today and pt says she does feel better than she has.  Still has little sores in her mouth.  She thinks it's from the inhaler.    Review of Systems:  Review of Systems  Constitutional: Positive for weight loss and malaise/fatigue. Negative for fever and chills.  HENT: Negative for congestion.        Sores in mouth beneath tongue  Respiratory: Positive for cough and wheezing. Negative for sputum production and shortness of breath.   Cardiovascular: Negative for chest pain.  Gastrointestinal: Negative for abdominal pain.  Genitourinary: Negative for dysuria.  Musculoskeletal: Negative for falls.  Neurological: Positive for weakness. Negative for dizziness.  Psychiatric/Behavioral: Positive for memory loss.    Past Medical History  Diagnosis Date  . COPD (chronic obstructive pulmonary disease) ([place])   . CHF (congestive heart failure) ([place])   . Edema   . Myocardial infarction ([place])   . Anxiety   . Hypertension   . Anemia   . Leukocytosis   . Acid reflux   . Osteoporosis   . Sinus drainage   . Weakness   . Unspecified intestinal obstruction ([place])   . Unspecified constipation   . Type II or unspecified type diabetes mellitus without mention of complication, not stated as uncontrolled   . Solitary pulmonary nodule   . Other  abnormal blood chemistry   . Acute bronchitis   . Adult failure to thrive   . Loss of weight   . Unspecified fall   . Insomnia, unspecified   . Insomnia, unspecified   . Open wound of scalp, without mention of complication   . Atrial fibrillation ([place])   . Edema   . Carbuncle and furuncle of other specified sites   . Personal history of methicillin resistant Staphylococcus aureus   . Impacted cerumen   . Chronic airway obstruction, not elsewhere classified   . Coronary atherosclerosis of native coronary artery   . Leukocytosis, unspecified   . Anxiety state, unspecified   . Depressive disorder, not elsewhere classified   . Unspecified essential hypertension   . Cardiomegaly   . Unspecified pleural effusion   . Unspecified pleural effusion   . Esophagitis, unspecified   . Reflux esophagitis   . Duodenal ulcer, unspecified as acute or chronic, without hemorrhage, perforation, or obstruction   . Diaphragmatic hernia without mention of obstruction or gangrene   . Senile osteoporosis   . Abnormal involuntary movements(781.0)   . Abnormality of gait   . Loss of weight   . Other dyspnea and respiratory abnormality     Past Surgical History  Procedure Laterality Date  . Laparotomy  04/04/2012    Procedure: EXPLORATORY LAPAROTOMY;  Surgeon: Adin Hector, MD;  Location: [facility];  Service: General;  Laterality:  N/A;  . Abdominal hysterectomy  1991  . Hip surgery    . Fracture surgery  2011    hip    No Active Allergies    Medication List       This list is accurate as of: 05/03/15 12:11 PM.  Always use your most recent med list.               acetaminophen 325 MG tablet  Commonly known as:  TYLENOL  Take 650 mg by mouth every 4 (four) hours as needed.     albuterol 108 (90 BASE) MCG/ACT inhaler  Commonly known as:  PROVENTIL HFA;VENTOLIN HFA  Inhale 2 puffs into the lungs every 6 (six) hours as needed for wheezing or shortness of breath.     aspirin 81 MG tablet    Take 81 mg by mouth daily.     diltiazem 240 MG 24 hr capsule  Commonly known as:  CARDIZEM CD  TAKE 1 CAP BY MOUTH ONCE DAILY     docusate sodium 100 MG capsule  Commonly known as:  COLACE  Take one tablet once daily     guaiFENesin 600 MG 12 hr tablet  Commonly known as:  MUCINEX  Take 1 tablet (600 mg total) by mouth daily after breakfast.     hydrochlorothiazide 25 MG tablet  Commonly known as:  HYDRODIURIL  TAKE 1 TABLET DAILY FOR EDEMA     hydrocortisone 25 MG suppository  Commonly known as:  ANUSOL-HC  Place 1 suppository (25 mg total) rectally daily as needed for hemorrhoids.     ipratropium-albuterol 0.5-2.5 (3) MG/3ML Soln  Commonly known as:  DUONEB  Inhale contents of One amp via nebulizer every 6 hours for wheezing and shortness of breath. Dx:J44.9     lidocaine 5 %  Commonly known as:  LIDODERM  Place 1 patch onto the skin daily. To right shoulder; Remove & Discard patch within 12 hours or as directed by MD     mirtazapine 15 MG tablet  Commonly known as:  REMERON  TAKE 1 TABLET AT BEDTIME     omeprazole 20 MG capsule  Commonly known as:  PRILOSEC  TAKE 1 CAPSULE DAILY TO REDUCE STOMACH ACID AND TO HELP PROTECT ESOPHAGUS     polyethylene glycol packet  Commonly known as:  MIRALAX / GLYCOLAX  Take 17 g by mouth daily as needed. constipation     protein supplement Powd  Take 1 scoop by mouth 3 (three) times daily with meals.     psyllium 58.6 % powder  Commonly known as:  METAMUCIL  Take 1 packet by mouth daily.     SENEXON-S 8.6-50 MG tablet  Generic drug:  senna-docusate  TAKE 1 TAB BY MOUTH EVERY DAY AS NEEDED FOR CONSTIPATION     sertraline 25 MG tablet  Commonly known as:  ZOLOFT  Take 1 tablet (25 mg total) by mouth daily.     Tiotropium Bromide Monohydrate 2.5 MCG/ACT Aers  Commonly known as:  SPIRIVA RESPIMAT  2 puffs daily into lungs for COPD     trolamine salicylate 10 % cream  Commonly known as:  ASPERCREME  Apply 1 application  topically 3 (three) times daily. X 1 week then every 4 hours as needed     Vitamin D (Ergocalciferol) 50000 UNITS Caps capsule  Commonly known as:  DRISDOL  Take 1 capsule (50,000 Units total) by mouth every 7 (seven) days.        Health Maintenance  Topic  Date Due  . OPHTHALMOLOGY EXAM  12/31/1926  . URINE MICROALBUMIN  12/31/1926  . TETANUS/TDAP  12/31/1935  . HEMOGLOBIN A1C  02/18/2015  . FOOT EXAM  05/22/2015  . INFLUENZA VACCINE  02/01/2016  . DEXA SCAN  Completed  . ZOSTAVAX  Completed  . PNA vac Low Risk Adult  Completed    Physical Exam: Filed Vitals:   05/03/15 1155  Pulse: 62  Temp: 97.9 F (36.6 C)  TempSrc: Oral  Resp: 20  Height: 5\' 1"  (1.549 m)  Weight: 87 lb 3.2 oz (39.554 kg)  SpO2: 95%   Body mass index is 16.48 kg/(m^2). Physical Exam  Constitutional:  Thin white female  HENT:  Ulcerations beneath tongue behind teeth on bottom; also some on buccal mucosa  Cardiovascular: Normal rate, regular rhythm and normal heart sounds.   Pulmonary/Chest: Effort normal and breath sounds normal. She has no wheezes.  Musculoskeletal: Normal range of motion.  Walks with walker  Neurological: She is alert.  Psychiatric: She has a normal mood and affect.    Labs reviewed: Basic Metabolic Panel:  Recent Labs  08/20/14 1422 11/12/14 1454  NA 138 141  K 4.4 3.6  CL 92* 96*  CO2 29 29  GLUCOSE 119* 118*  BUN 16 17  CREATININE 0.88 0.90  CALCIUM 11.2* 10.1   Liver Function Tests:  Recent Labs  08/20/14 1422  AST 13  ALT 8  ALKPHOS 87  BILITOT 0.5  PROT 6.7  ALBUMIN 3.9    Recent Labs  08/20/14 1422  LIPASE 14  AMYLASE 35   No results for input(s): AMMONIA in the last 8760 hours. CBC:  Recent Labs  05/19/14 0952 08/20/14 1422 11/12/14 1454  WBC 14.6* 23.6* 15.0*  NEUTROABS 8.0* 17.4* 8.1*  HGB 13.5 12.7  --   HCT 39.7 37.5 40.4  MCV 89 89  --   PLT 416* 481*  --    Lipid Panel:  Recent Labs  05/19/14 0952  CHOL 186  HDL  63  LDLCALC 93  TRIG 152*  CHOLHDL 3.0   Lab Results  Component Value Date   HGBA1C 6.6* 08/20/2014    Assessment/Plan 1. Oral thrush -due to steroids in inhaler -will treat accordingly and pt educated to rinse out her mouth after use--staff to reinforce this - nystatin (MYCOSTATIN) 100000 UNIT/ML suspension; Take 5 mLs (500,000 Units total) by mouth 4 (four) times daily.  Dispense: 120 mL; Refill: 0  2. Acute bronchitis, unspecified organism - improving, she is completing abx and says she was really bad and thought she might die a couple of days ago, but has gotten much better since -afebrile and lungs are clear now  3. Chronic obstructive pulmonary disease, unspecified COPD, unspecified chronic bronchitis type -treated her with abx due to h/o copd exacerbations and high risk for pneumonia -asked [facility] to get incentive spirometer for her  Next appt:  Keep as scheduled  Cortavius Montesinos L. Atlee Villers, D.O. [facility] [address], [place] [address] Cell Phone (Mon-Fri 8am-5pm):  [phone] On Call:  [phone] & follow prompts after 5pm & weekends Office Phone:  [phone] Office Fax:  [phone]

## 2015-05-18 ENCOUNTER — Telehealth: Payer: Self-pay | Admitting: *Deleted

## 2015-05-18 NOTE — Telephone Encounter (Signed)
Received fax from [facility] with [facility]# [phone] wanting signed orders for PT,OT and to evaluate and treat for recent decline in functional Mobility and ADL. Wants signed orders faxed back to fax#: [phone]. Given to Dr. Mariea Clonts to review and sign.

## 2015-06-03 ENCOUNTER — Ambulatory Visit (INDEPENDENT_AMBULATORY_CARE_PROVIDER_SITE_OTHER): Payer: Medicare Other | Admitting: Internal Medicine

## 2015-06-03 ENCOUNTER — Encounter: Payer: Self-pay | Admitting: Internal Medicine

## 2015-06-03 VITALS — BP 118/72 | HR 94 | Temp 98.2°F | Resp 20 | Ht 61.0 in | Wt 87.6 lb

## 2015-06-03 DIAGNOSIS — K121 Other forms of stomatitis: Secondary | ICD-10-CM | POA: Diagnosis not present

## 2015-06-03 DIAGNOSIS — F411 Generalized anxiety disorder: Secondary | ICD-10-CM

## 2015-06-03 DIAGNOSIS — J449 Chronic obstructive pulmonary disease, unspecified: Secondary | ICD-10-CM | POA: Diagnosis not present

## 2015-06-03 DIAGNOSIS — G250 Essential tremor: Secondary | ICD-10-CM | POA: Diagnosis not present

## 2015-06-03 MED ORDER — UMECLIDINIUM BROMIDE 62.5 MCG/INH IN AEPB: 1.0000 | INHALATION_SPRAY | Freq: Every day | RESPIRATORY_TRACT | Status: DC

## 2015-06-03 NOTE — Progress Notes (Signed)
Patient ID: Bianca Khan, female   DOB: 30-Jan-1915, 81 y.o.   MRN: [id number]   Location: [facility] Provider: Rexene Edison. Mariea Clonts, D.O., C.M.D.  Code Status: DNR Goals of Care: Advanced Directive information Does patient have an advance directive?: Yes  Chief Complaint  Patient presents with  . Medical Management of Chronic Issues    3 month follow-up for Hypertension, patient would like something for wheezing, and nerves    HPI: Patient is a 81 y.o. female seen in the office today for med mgt of chronic illness.    COPD:  Worst in am.  Continues with spells of wheezing here and there.  Comes and goes.  Has been on spiriva for many years.  Discussed that her disease may be a bit progressive also.    Nervous:  When she goes to write, she is shaky.  Has some action tremors.  Discussed that essential tremors get better with propanolol but it causes dizziness and hypotension.  Weight stable.    Mouth remains sore, so stopped thrush treatment b/c she thought it was the cause, but it was the reason it was started (soreness behind her teeth under her tongue).    Review of Systems:  Review of Systems  Constitutional: Negative for fever, chills and malaise/fatigue.  HENT: Negative for congestion.   Eyes: Negative for blurred vision.  Respiratory: Positive for shortness of breath and wheezing. Negative for cough and sputum production.   Cardiovascular: Negative for chest pain.  Gastrointestinal: Negative for abdominal pain.  Genitourinary: Negative for dysuria.  Musculoskeletal: Negative for myalgias and falls.  Skin: Negative for rash.  Neurological: Positive for tremors. Negative for weakness.  Endo/Heme/Allergies: Does not bruise/bleed easily.  Psychiatric/Behavioral: Positive for memory loss. The patient is nervous/anxious.     Past Medical History  Diagnosis Date  . COPD (chronic obstructive pulmonary disease) ([place])   . CHF (congestive heart failure) ([facility])   . Edema   .  Myocardial infarction ([place])   . Anxiety   . Hypertension   . Anemia   . Leukocytosis   . Acid reflux   . Osteoporosis   . Sinus drainage   . Weakness   . Unspecified intestinal obstruction ([place])   . Unspecified constipation   . Type II or unspecified type diabetes mellitus without mention of complication, not stated as uncontrolled   . Solitary pulmonary nodule   . Other abnormal blood chemistry   . Acute bronchitis   . Adult failure to thrive   . Loss of weight   . Unspecified fall   . Insomnia, unspecified   . Insomnia, unspecified   . Open wound of scalp, without mention of complication   . Atrial fibrillation ([place])   . Edema   . Carbuncle and furuncle of other specified sites   . Personal history of methicillin resistant Staphylococcus aureus   . Impacted cerumen   . Chronic airway obstruction, not elsewhere classified   . Coronary atherosclerosis of native coronary artery   . Leukocytosis, unspecified   . Anxiety state, unspecified   . Depressive disorder, not elsewhere classified   . Unspecified essential hypertension   . Cardiomegaly   . Unspecified pleural effusion   . Unspecified pleural effusion   . Esophagitis, unspecified   . Reflux esophagitis   . Duodenal ulcer, unspecified as acute or chronic, without hemorrhage, perforation, or obstruction   . Diaphragmatic hernia without mention of obstruction or gangrene   . Senile osteoporosis   . Abnormal  involuntary movements(781.0)   . Abnormality of gait   . Loss of weight   . Other dyspnea and respiratory abnormality     Past Surgical History  Procedure Laterality Date  . Laparotomy  04/04/2012    Procedure: EXPLORATORY LAPAROTOMY;  Surgeon: Adin Hector, MD;  Location: [facility];  Service: General;  Laterality: N/A;  . Abdominal hysterectomy  1991  . Hip surgery    . Fracture surgery  2011    hip    No Active Allergies    Medication List       This list is accurate as of: 06/03/15  2:58 PM.  Always  use your most recent med list.               acetaminophen 325 MG tablet  Commonly known as:  TYLENOL  Take 650 mg by mouth every 4 (four) hours as needed.     albuterol 108 (90 BASE) MCG/ACT inhaler  Commonly known as:  PROVENTIL HFA;VENTOLIN HFA  Inhale 2 puffs into the lungs every 6 (six) hours as needed for wheezing or shortness of breath.     aspirin 81 MG tablet  Take 81 mg by mouth daily.     diltiazem 240 MG 24 hr capsule  Commonly known as:  CARDIZEM CD  TAKE 1 CAP BY MOUTH ONCE DAILY     docusate sodium 100 MG capsule  Commonly known as:  COLACE  Take one tablet once daily     guaiFENesin 600 MG 12 hr tablet  Commonly known as:  MUCINEX  Take 1 tablet (600 mg total) by mouth daily after breakfast.     hydrochlorothiazide 25 MG tablet  Commonly known as:  HYDRODIURIL  TAKE 1 TABLET DAILY FOR EDEMA     hydrocortisone 25 MG suppository  Commonly known as:  ANUSOL-HC  Place 1 suppository (25 mg total) rectally daily as needed for hemorrhoids.     ipratropium-albuterol 0.5-2.5 (3) MG/3ML Soln  Commonly known as:  DUONEB  Inhale contents of One amp via nebulizer every 6 hours for wheezing and shortness of breath. Dx:J44.9     lidocaine 5 %  Commonly known as:  LIDODERM  Place 1 patch onto the skin daily. To right shoulder; Remove & Discard patch within 12 hours or as directed by MD     mirtazapine 15 MG tablet  Commonly known as:  REMERON  TAKE 1 TABLET AT BEDTIME     nystatin 100000 UNIT/ML suspension  Commonly known as:  MYCOSTATIN  Take 5 mLs (500,000 Units total) by mouth 4 (four) times daily.     omeprazole 20 MG capsule  Commonly known as:  PRILOSEC  TAKE 1 CAPSULE DAILY TO REDUCE STOMACH ACID AND TO HELP PROTECT ESOPHAGUS     polyethylene glycol packet  Commonly known as:  MIRALAX / GLYCOLAX  Take 17 g by mouth daily as needed. constipation     protein supplement Powd  Take 1 scoop by mouth 3 (three) times daily with meals.     psyllium 58.6  % powder  Commonly known as:  METAMUCIL  Take 1 packet by mouth daily.     SENEXON-S 8.6-50 MG tablet  Generic drug:  senna-docusate  TAKE 1 TAB BY MOUTH EVERY DAY AS NEEDED FOR CONSTIPATION     sertraline 25 MG tablet  Commonly known as:  ZOLOFT  Take 1 tablet (25 mg total) by mouth daily.     Tiotropium Bromide Monohydrate 2.5 MCG/ACT Aers  Commonly known  as:  SPIRIVA RESPIMAT  2 puffs daily into lungs for COPD     trolamine salicylate 10 % cream  Commonly known as:  ASPERCREME  Apply 1 application topically 3 (three) times daily. X 1 week then every 4 hours as needed     Vitamin D (Ergocalciferol) 50000 UNITS Caps capsule  Commonly known as:  DRISDOL  Take 1 capsule (50,000 Units total) by mouth every 7 (seven) days.        Health Maintenance  Topic Date Due  . OPHTHALMOLOGY EXAM  12/31/1926  . URINE MICROALBUMIN  12/31/1926  . TETANUS/TDAP  12/31/1935  . HEMOGLOBIN A1C  02/18/2015  . FOOT EXAM  05/22/2015  . INFLUENZA VACCINE  02/01/2016  . DEXA SCAN  Completed  . ZOSTAVAX  Completed  . PNA vac Low Risk Adult  Completed    Physical Exam: Filed Vitals:   06/03/15 1421  BP: 118/72  Pulse: 94  Temp: 98.2 F (36.8 C)  TempSrc: Oral  Resp: 20  Height: 5\' 1"  (1.549 m)  Weight: 87 lb 9.6 oz (39.735 kg)  SpO2: 94%   Body mass index is 16.56 kg/(m^2). Physical Exam  Constitutional: She appears well-developed and well-nourished. No distress.  HENT:  Pencil eraser sized ulcer on roof of mouth with severe inflammation  Cardiovascular: Normal rate, regular rhythm, normal heart sounds and intact distal pulses.   Pulmonary/Chest: Effort normal and breath sounds normal. She has no wheezes.  Musculoskeletal: Normal range of motion.  Neurological: She is alert.  Poor short term memory--repeats the same questions 3-4 times within a 40 minute interval; action and resting rapid tremor of hands  Skin: Skin is warm and dry.  Psychiatric: She has a normal mood and affect.      Labs reviewed: Basic Metabolic Panel:  Recent Labs  08/20/14 1422 11/12/14 1454  NA 138 141  K 4.4 3.6  CL 92* 96*  CO2 29 29  GLUCOSE 119* 118*  BUN 16 17  CREATININE 0.88 0.90  CALCIUM 11.2* 10.1   Liver Function Tests:  Recent Labs  08/20/14 1422  AST 13  ALT 8  ALKPHOS 87  BILITOT 0.5  PROT 6.7  ALBUMIN 3.9    Recent Labs  08/20/14 1422  LIPASE 14  AMYLASE 35   No results for input(s): AMMONIA in the last 8760 hours. CBC:  Recent Labs  08/20/14 1422 11/12/14 1454  WBC 23.6* 15.0*  NEUTROABS 17.4* 8.1*  HGB 12.7  --   HCT 37.5 40.4  MCV 89  --   PLT 481*  --    Lipid Panel: No results for input(s): CHOL, HDL, LDLCALC, TRIG, CHOLHDL, LDLDIRECT in the last 8760 hours. Lab Results  Component Value Date   HGBA1C 6.6* 08/20/2014   Assessment/Plan 1. Essential tremor -discussed that she is unlikely to tolerate any medications for this like primidone or propanolol due to hypotension and she is already a fall risk -is on zoloft for her anxiety -it's very difficult to figure out if she's saying she's anxious or not b/c she keeps calling her tremor "being nervous" but I don't really think she means anxiety  2. Chronic obstructive pulmonary disease, unspecified COPD, unspecified chronic bronchitis type - d/c spiriva and start incruse inhaler--device should also be easier for pt and staff to use vs. spiriva -Umeclidinium Bromide (INCRUSE ELLIPTA) 62.5 MCG/INH AEPB; Inhale 1 puff into the lungs daily.  Dispense: 30 each; Refill: 3  3. Anxiety state -cont zoloft--if she continues to say she's "nervous",  I will increase the dose next visit  4. Oral ulcer - on roof of mouth -large size -did not respond to nystatin treatment and staff couldn't figure out how to get peridex for her - Ambulatory referral to ENT--I am concerned about the large size of this, significant pain she has from it and her history of smoking she has--she also c/o sores behind her  bottom teeth under her tongue--I just see some capillary dilatation  Labs/tests ordered:  Orders Placed This Encounter  Procedures  . Ambulatory referral to ENT    Referral Priority:  Routine    Referral Type:  Consultation    Referral Reason:  Specialty Services Required    Requested Specialty:  Otolaryngology    Number of Visits Requested:  1    Next appt:  09/06/2015   Dayanara Sherrill L. Tryson Lumley, D.O. [facility] [address], [place] [address] Cell Phone (Mon-Fri 8am-5pm):  [phone] On Call:  [phone] & follow prompts after 5pm & weekends Office Phone:  [phone] Office Fax:  [phone]

## 2015-06-04 ENCOUNTER — Encounter: Payer: Self-pay | Admitting: Internal Medicine

## 2015-06-04 MED ORDER — AMBULATORY NON FORMULARY MEDICATION: Status: DC

## 2015-06-04 NOTE — Telephone Encounter (Signed)
Patient caregiver Colletta Maryland notified. Faxed message to [facility] requesting Peridex. Faxed rx with message.  Fax: [phone]

## 2015-06-10 DIAGNOSIS — J441 Chronic obstructive pulmonary disease with (acute) exacerbation: Secondary | ICD-10-CM | POA: Diagnosis not present

## 2015-06-10 DIAGNOSIS — R279 Unspecified lack of coordination: Secondary | ICD-10-CM | POA: Diagnosis not present

## 2015-06-10 DIAGNOSIS — M6281 Muscle weakness (generalized): Secondary | ICD-10-CM | POA: Diagnosis not present

## 2015-06-10 DIAGNOSIS — M150 Primary generalized (osteo)arthritis: Secondary | ICD-10-CM | POA: Diagnosis not present

## 2015-06-14 DIAGNOSIS — M6281 Muscle weakness (generalized): Secondary | ICD-10-CM | POA: Diagnosis not present

## 2015-06-14 DIAGNOSIS — M150 Primary generalized (osteo)arthritis: Secondary | ICD-10-CM | POA: Diagnosis not present

## 2015-06-14 DIAGNOSIS — J441 Chronic obstructive pulmonary disease with (acute) exacerbation: Secondary | ICD-10-CM | POA: Diagnosis not present

## 2015-06-14 DIAGNOSIS — R279 Unspecified lack of coordination: Secondary | ICD-10-CM | POA: Diagnosis not present

## 2015-06-15 DIAGNOSIS — K1379 Other lesions of oral mucosa: Secondary | ICD-10-CM | POA: Diagnosis not present

## 2015-06-15 DIAGNOSIS — H6123 Impacted cerumen, bilateral: Secondary | ICD-10-CM | POA: Diagnosis not present

## 2015-06-16 DIAGNOSIS — R279 Unspecified lack of coordination: Secondary | ICD-10-CM | POA: Diagnosis not present

## 2015-06-16 DIAGNOSIS — M6281 Muscle weakness (generalized): Secondary | ICD-10-CM | POA: Diagnosis not present

## 2015-06-16 DIAGNOSIS — J441 Chronic obstructive pulmonary disease with (acute) exacerbation: Secondary | ICD-10-CM | POA: Diagnosis not present

## 2015-06-16 DIAGNOSIS — M150 Primary generalized (osteo)arthritis: Secondary | ICD-10-CM | POA: Diagnosis not present

## 2015-06-17 ENCOUNTER — Telehealth: Payer: Self-pay | Admitting: *Deleted

## 2015-06-17 NOTE — Telephone Encounter (Signed)
Received Care plan fax from [facility] # [phone] from Dana Allan, RN for Dr. Mariea Clonts to review and sign. Placed in her folder to be faxed back to fax#: [phone]

## 2015-06-21 DIAGNOSIS — M6281 Muscle weakness (generalized): Secondary | ICD-10-CM | POA: Diagnosis not present

## 2015-06-21 DIAGNOSIS — J441 Chronic obstructive pulmonary disease with (acute) exacerbation: Secondary | ICD-10-CM | POA: Diagnosis not present

## 2015-06-21 DIAGNOSIS — R279 Unspecified lack of coordination: Secondary | ICD-10-CM | POA: Diagnosis not present

## 2015-06-21 DIAGNOSIS — M150 Primary generalized (osteo)arthritis: Secondary | ICD-10-CM | POA: Diagnosis not present

## 2015-06-22 ENCOUNTER — Other Ambulatory Visit: Payer: Self-pay | Admitting: Otolaryngology

## 2015-06-22 DIAGNOSIS — H6123 Impacted cerumen, bilateral: Secondary | ICD-10-CM | POA: Diagnosis not present

## 2015-06-22 DIAGNOSIS — R220 Localized swelling, mass and lump, head: Secondary | ICD-10-CM | POA: Diagnosis not present

## 2015-06-22 DIAGNOSIS — K136 Irritative hyperplasia of oral mucosa: Secondary | ICD-10-CM | POA: Diagnosis not present

## 2015-06-24 DIAGNOSIS — M6281 Muscle weakness (generalized): Secondary | ICD-10-CM | POA: Diagnosis not present

## 2015-06-24 DIAGNOSIS — R279 Unspecified lack of coordination: Secondary | ICD-10-CM | POA: Diagnosis not present

## 2015-06-24 DIAGNOSIS — M150 Primary generalized (osteo)arthritis: Secondary | ICD-10-CM | POA: Diagnosis not present

## 2015-06-24 DIAGNOSIS — J441 Chronic obstructive pulmonary disease with (acute) exacerbation: Secondary | ICD-10-CM | POA: Diagnosis not present

## 2015-07-01 ENCOUNTER — Telehealth: Payer: Self-pay

## 2015-07-01 DIAGNOSIS — M6281 Muscle weakness (generalized): Secondary | ICD-10-CM | POA: Diagnosis not present

## 2015-07-01 DIAGNOSIS — M150 Primary generalized (osteo)arthritis: Secondary | ICD-10-CM | POA: Diagnosis not present

## 2015-07-01 DIAGNOSIS — R279 Unspecified lack of coordination: Secondary | ICD-10-CM | POA: Diagnosis not present

## 2015-07-01 DIAGNOSIS — J441 Chronic obstructive pulmonary disease with (acute) exacerbation: Secondary | ICD-10-CM | POA: Diagnosis not present

## 2015-07-01 NOTE — Telephone Encounter (Signed)
Melissa Civil engineer, contracting of Nursing) called to confirm that order to d/c nebulizer was thoroughly reviewed for she does not feel that patient should be without nebulizer. Patient will be back to wheezing on a daily basis with out use of nebulizer. Patient thinks mouth sores are related to nebulizer use yet the director of nursing does not think that is so.  Please advise

## 2015-07-01 NOTE — Telephone Encounter (Signed)
Spoke with Melissa and discussed Dr.Reed's response. Note from ENT will be faxed to [facility]. ENT note recommends salt water rinses after breathing treatments.

## 2015-07-01 NOTE — Telephone Encounter (Signed)
I agree with DON.  We can continue the nebs, but someone other than me needs to also tell pt that her ulcers are not from the nebulizer.  I have told her at the last three visits, but she does not accept it due to her dementia.  Colletta Maryland, pt's daughter in law, tends to just say what patient wants to make her happy instead of helping out in these situations.  I realize she just wants Deidra to be content, but we cannot treat her properly without the family support.

## 2015-07-16 ENCOUNTER — Encounter: Payer: Self-pay | Admitting: Internal Medicine

## 2015-07-19 ENCOUNTER — Encounter: Payer: Self-pay | Admitting: Internal Medicine

## 2015-07-19 ENCOUNTER — Ambulatory Visit (INDEPENDENT_AMBULATORY_CARE_PROVIDER_SITE_OTHER): Payer: Medicare Other | Admitting: Internal Medicine

## 2015-07-19 VITALS — BP 148/82 | HR 84 | Temp 98.1°F | Ht 61.0 in | Wt 86.0 lb

## 2015-07-19 DIAGNOSIS — F0280 Dementia in other diseases classified elsewhere without behavioral disturbance: Secondary | ICD-10-CM | POA: Diagnosis not present

## 2015-07-19 DIAGNOSIS — J449 Chronic obstructive pulmonary disease, unspecified: Secondary | ICD-10-CM | POA: Diagnosis not present

## 2015-07-19 DIAGNOSIS — Z789 Other specified health status: Secondary | ICD-10-CM | POA: Diagnosis not present

## 2015-07-19 DIAGNOSIS — G301 Alzheimer's disease with late onset: Secondary | ICD-10-CM | POA: Diagnosis not present

## 2015-07-19 DIAGNOSIS — M898X9 Other specified disorders of bone, unspecified site: Secondary | ICD-10-CM | POA: Diagnosis not present

## 2015-07-19 DIAGNOSIS — R531 Weakness: Secondary | ICD-10-CM | POA: Diagnosis not present

## 2015-07-19 DIAGNOSIS — I5032 Chronic diastolic (congestive) heart failure: Secondary | ICD-10-CM

## 2015-07-19 DIAGNOSIS — K121 Other forms of stomatitis: Secondary | ICD-10-CM | POA: Diagnosis not present

## 2015-07-19 NOTE — Progress Notes (Signed)
Patient ID: Bianca Khan, female   DOB: 1915/06/14, 82 y.o.   MRN: [id number]   Location: [facility] Provider: Rexene Edison. Mariea Khan, D.O., C.M.D.  Code Status: DNR Goals of Care: Advanced Directive information Advanced Directives 07/19/2015  Does patient have an advance directive? Yes  Type of Par[employer] of [place];Out of facility DNR (pink MOST or yellow form)  Does patient want to make changes to advanced directive? -  Copy of advanced directive(s) in chart? -  Pre-existing out of facility DNR order (yellow form or pink MOST form) Yellow form placed in chart (order not valid for inpatient use)     Chief Complaint  Patient presents with  . Acute Visit    mouth ulcers, they get better then they break out again. Here with daughter-in-law Bianca Khan    HPI: Patient is a 82 y.o. female seen in the office today for an acute visit for mouth ulcers.  She is using salt water rinses to help with her ulcers after her neb treatments and this is helpful.  She is having mouth sores which I have been unable to see aside from the torus that was biopsied by ENT and found to be benign.  We have treated her with nystatin which she perceived made things worse.  She also says peridex makes them worse.  When ENT said salt water rinses, she agreed this was working wonders, but continues to complain of same symptoms.    Per Bianca Khan, "Bianca Khan is still having lots of mouth sores, she's eating very little, and uses her walker very little. After you saw Bianca Khan on 12/1, we started PT @ [facility] to encourage Bianca Khan to use her walker more to build up her stamina---she's very weak. Bianca Khan has stopped this. Even with the inhalers, Bianca Khan still wheezes and loses her breath with any exertion. Could you please discuss/explain to Bianca Khan what COPD is. Bianca Khan still thinks that antibiotics would help more than using the inhalers/nebulizer. Hope your time with Bianca Khan will encourage her to use her walker more. What  about oxygen??"   Is tired of soft foods.  Intake has been down.  Wt 85 lbs.  Wt down 1.6 lbs, but stable overall.    She reports she is not getting up as much and this is why she has not been using her walker.  Is not doing extra walking due to the weakness.  She stopped her PT due to weakness.    We discussed this extensively also and she does not seem convinced that doing just a little more walking each day with her walker will keep her stronger--just keeps saying she does not have the strength to do it.  COPD:  Bianca Khan has difficulty using inhalers with her dementia and weakness at 82yo.  She also is convinced that one of the treatments caused the torus on the roof of her mouth and all of her mouth ulcers.  She is truly more dyspneic on exertion and wheezing more, but does not accept when I explain her disease (and does not remember). She wants a magic pill that will fix the condition like acute bronchitis and she was educated on this again today.  Her sats were 95% after walking back to the exam room so she would not qualify for oxygen.    Review of Systems:  Review of Systems  Constitutional: Positive for weight loss and malaise/fatigue. Negative for fever and chills.  HENT: Negative for congestion.   Respiratory: Positive for shortness of  breath and wheezing. Negative for cough and sputum production.   Cardiovascular: Negative for chest pain and leg swelling.  Gastrointestinal: Negative for abdominal pain.       Decreased intake  Genitourinary: Negative for dysuria.  Musculoskeletal: Negative for falls.  Neurological: Positive for weakness. Negative for dizziness and focal weakness.  Psychiatric/Behavioral: Positive for depression and memory loss. The patient is nervous/anxious.     Past Medical History  Diagnosis Date  . COPD (chronic obstructive pulmonary disease) ([place])   . CHF (congestive heart failure) ([place])   . Edema   . Myocardial infarction ([place])   . Anxiety   . Hypertension     . Anemia   . Leukocytosis   . Acid reflux   . Osteoporosis   . Sinus drainage   . Weakness   . Unspecified intestinal obstruction ([place])   . Unspecified constipation   . Type II or unspecified type diabetes mellitus without mention of complication, not stated as uncontrolled   . Solitary pulmonary nodule   . Other abnormal blood chemistry   . Acute bronchitis   . Adult failure to thrive   . Loss of weight   . Unspecified fall   . Insomnia, unspecified   . Insomnia, unspecified   . Open wound of scalp, without mention of complication   . Atrial fibrillation ([place])   . Edema   . Carbuncle and furuncle of other specified sites   . Personal history of methicillin resistant Staphylococcus aureus   . Impacted cerumen   . Chronic airway obstruction, not elsewhere classified   . Coronary atherosclerosis of native coronary artery   . Leukocytosis, unspecified   . Anxiety state, unspecified   . Depressive disorder, not elsewhere classified   . Unspecified essential hypertension   . Cardiomegaly   . Unspecified pleural effusion   . Unspecified pleural effusion   . Esophagitis, unspecified   . Reflux esophagitis   . Duodenal ulcer, unspecified as acute or chronic, without hemorrhage, perforation, or obstruction   . Diaphragmatic hernia without mention of obstruction or gangrene   . Senile osteoporosis   . Abnormal involuntary movements(781.0)   . Abnormality of gait   . Loss of weight   . Other dyspnea and respiratory abnormality     Past Surgical History  Procedure Laterality Date  . Laparotomy  04/04/2012    Procedure: EXPLORATORY LAPAROTOMY;  Surgeon: Adin Hector, MD;  Location: [place];  Service: General;  Laterality: N/A;  . Abdominal hysterectomy  1991  . Hip surgery    . Fracture surgery  2011    hip    No Active Allergies    Medication List       This list is accurate as of: 07/19/15 12:02 PM.  Always use your most recent med list.                acetaminophen 325 MG tablet  Commonly known as:  TYLENOL  Take 650 mg by mouth every 4 (four) hours as needed.     albuterol 108 (90 Base) MCG/ACT inhaler  Commonly known as:  PROVENTIL HFA;VENTOLIN HFA  Inhale 2 puffs into the lungs every 6 (six) hours as needed for wheezing or shortness of breath.     AMBULATORY NON FORMULARY MEDICATION  Peridex Gel Apply to ulcerated area on roof of mouth three times after meals     aspirin 81 MG tablet  Take 81 mg by mouth daily.     diltiazem 240 MG 24  hr capsule  Commonly known as:  CARDIZEM CD  TAKE 1 CAP BY MOUTH ONCE DAILY     docusate sodium 100 MG capsule  Commonly known as:  COLACE  Take one tablet once daily     guaiFENesin 600 MG 12 hr tablet  Commonly known as:  MUCINEX  Take 1 tablet (600 mg total) by mouth daily after breakfast.     hydrochlorothiazide 25 MG tablet  Commonly known as:  HYDRODIURIL  TAKE 1 TABLET DAILY FOR EDEMA     hydrocortisone 25 MG suppository  Commonly known as:  ANUSOL-HC  Place 1 suppository (25 mg total) rectally daily as needed for hemorrhoids.     ipratropium-albuterol 0.5-2.5 (3) MG/3ML Soln  Commonly known as:  DUONEB  Inhale contents of One amp via nebulizer every 6 hours for wheezing and shortness of breath. Dx:J44.9     lidocaine 5 %  Commonly known as:  LIDODERM  Place 1 patch onto the skin daily. To right shoulder; Remove & Discard patch within 12 hours or as directed by MD     mirtazapine 15 MG tablet  Commonly known as:  REMERON  TAKE 1 TABLET AT BEDTIME     omeprazole 20 MG capsule  Commonly known as:  PRILOSEC  TAKE 1 CAPSULE DAILY TO REDUCE STOMACH ACID AND TO HELP PROTECT ESOPHAGUS     polyethylene glycol packet  Commonly known as:  MIRALAX / GLYCOLAX  Take 17 g by mouth daily as needed. constipation     protein supplement Powd  Take 1 scoop by mouth 3 (three) times daily with meals.     psyllium 58.6 % powder  Commonly known as:  METAMUCIL  Take 1 packet by mouth  daily.     SENEXON-S 8.6-50 MG tablet  Generic drug:  senna-docusate  TAKE 1 TAB BY MOUTH EVERY DAY AS NEEDED FOR CONSTIPATION     sertraline 25 MG tablet  Commonly known as:  ZOLOFT  Take 1 tablet (25 mg total) by mouth daily.     trolamine salicylate 10 % cream  Commonly known as:  ASPERCREME  Apply 1 application topically 3 (three) times daily. X 1 week then every 4 hours as needed     Umeclidinium Bromide 62.5 MCG/INH Aepb  Commonly known as:  INCRUSE ELLIPTA  Inhale 1 puff into the lungs daily.     Vitamin D (Ergocalciferol) 50000 units Caps capsule  Commonly known as:  DRISDOL  Take 1 capsule (50,000 Units total) by mouth every 7 (seven) days.        Physical Exam: Filed Vitals:   07/19/15 1150  BP: 148/82  Pulse: 84  Temp: 98.1 F (36.7 C)  TempSrc: Oral  Height: 5\' 1"  (1.549 m)  Weight: 86 lb (39.009 kg)  SpO2: 95%   Body mass index is 16.26 kg/(m^2). Physical Exam  Constitutional: No distress.  Thin white female  Neck: Neck supple. No JVD present.  Cardiovascular: Normal rate, regular rhythm, normal heart sounds and intact distal pulses.   Pulmonary/Chest: She has wheezes.  Some pursed lip breathing after walking distances  Abdominal: Bowel sounds are normal.  Musculoskeletal: Normal range of motion.  Walks slowly with her walker  Neurological: She is alert.  Very poor short term memory--repeats same questions throughout visit   Skin: Skin is warm and dry.  Has torus on roof of mouth; no visible ulcerations in any of the areas she is complaining of on her gums  Psychiatric: She has a normal mood  and affect.    Labs reviewed: Basic Metabolic Panel:  Recent Labs  08/20/14 1422 11/12/14 1454  NA 138 141  K 4.4 3.6  CL 92* 96*  CO2 29 29  GLUCOSE 119* 118*  BUN 16 17  CREATININE 0.88 0.90  CALCIUM 11.2* 10.1   Liver Function Tests:  Recent Labs  08/20/14 1422  AST 13  ALT 8  ALKPHOS 87  BILITOT 0.5  PROT 6.7  ALBUMIN 3.9     Recent Labs  08/20/14 1422  LIPASE 14  AMYLASE 35   No results for input(s): AMMONIA in the last 8760 hours. CBC:  Recent Labs  08/20/14 1422 11/12/14 1454  WBC 23.6* 15.0*  NEUTROABS 17.4* 8.1*  HGB 12.7  --   HCT 37.5 40.4  MCV 89 87  PLT 481* 486*   Lipid Panel: No results for input(s): CHOL, HDL, LDLCALC, TRIG, CHOLHDL, LDLDIRECT in the last 8760 hours. Lab Results  Component Value Date   HGBA1C 6.6* 08/20/2014   Assessment/Plan 1. Active advance directive - DNR (Do Not Resuscitate)  2. Chronic obstructive pulmonary disease, unspecified COPD, unspecified chronic bronchitis type - seems her dyspnea is worsening - may be due to this or progression of her chf over time--she has no edema and certainly no weight gain - she is wheezing more and cannot properly use inhalers--[place] and cannot keep bedside inhaler due to regulations -thinks nebs cause her "ulcers" that I cannot see - cont current albuterol inhaler for now as needed, duonebs every 6 hrs, incruse ellipta, mucinex (at pt insistence) - Ambulatory referral to Pulmonology--needs education and best inhalers/nebs for her current conditions  3. Oral ulcer -turns out this was benign and only a torus on her roof of her mouth when biopsied -I see no other ulcers -cont salt water rinses after inhaler and neb use  4. Chronic diastolic congestive heart failure, NYHA class 1 (HCC) -also present, may be worsening some also, but no over volume overload  5. Late onset Alzheimer's disease without behavioral disturbance -THIS IS HER BIGGEST PROBLEM AND BARRIER TO CARE -lives in [place] at [facility] -unfortunately, she lacks insight altogether as many dementia patients do and is very angry when I try to get a real history from her daughter in law -family therefore allows the repeated questioning and very little headway is made and none of them try to help answer her questions, but defer back to me each time  -often  we have extensive discussions about why meds are needed, but then patient changes her mind at facility and faxes are sent to d/c the orders and then the same discussion occurs at the next appt  6. Weakness -progressing due to worsening lung disease, decreased intake with soft food diet since biopsy and "ulcers" and age of 54 -educated on these factors -also educated on need to ambulate to maintain strength since pt discontinued her therapy - Ambulatory referral to Pulmonology  7. Bony prominence -on roof of mouth--biopsied and benign  Labs/tests ordered:  None Next appt:  07/26/2015--was to cancel this but it appears they didn't and keep March appt  Siddh Vandeventer L. Lalania Haseman, D.O. [facility] [address], [place] [address] Cell Phone (Mon-Fri 8am-5pm):  [phone] On Call:  [phone] & follow prompts after 5pm & weekends Office Phone:  [phone] Office Fax:  [phone]

## 2015-07-26 ENCOUNTER — Ambulatory Visit (INDEPENDENT_AMBULATORY_CARE_PROVIDER_SITE_OTHER): Payer: Medicare Other | Admitting: Internal Medicine

## 2015-07-26 ENCOUNTER — Encounter: Payer: Self-pay | Admitting: Internal Medicine

## 2015-07-26 VITALS — BP 136/72 | HR 62 | Temp 97.6°F | Resp 20 | Ht 61.0 in | Wt 85.4 lb

## 2015-07-26 DIAGNOSIS — K121 Other forms of stomatitis: Secondary | ICD-10-CM | POA: Diagnosis not present

## 2015-07-26 DIAGNOSIS — G301 Alzheimer's disease with late onset: Secondary | ICD-10-CM

## 2015-07-26 DIAGNOSIS — J449 Chronic obstructive pulmonary disease, unspecified: Secondary | ICD-10-CM | POA: Diagnosis not present

## 2015-07-26 DIAGNOSIS — F0280 Dementia in other diseases classified elsewhere without behavioral disturbance: Secondary | ICD-10-CM

## 2015-07-26 MED ORDER — LEVALBUTEROL HCL 0.63 MG/3ML IN NEBU: 0.6300 mg | INHALATION_SOLUTION | Freq: Four times a day (QID) | RESPIRATORY_TRACT | Status: DC | PRN

## 2015-07-26 MED ORDER — MAGIC MOUTHWASH W/LIDOCAINE: 5.0000 mL | Freq: Four times a day (QID) | ORAL | Status: DC | PRN

## 2015-07-26 NOTE — Progress Notes (Signed)
Patient ID: Bianca Khan, female   DOB: [birth year], 82 y.o.   MRN: [id number]   Location: [facility] Provider: Rexene Edison. Mariea Clonts, D.O., C.M.D.  Code Status: DNR Goals of Care: Advanced Directive information Does patient have an advance directive?: Yes, Type of Advance Directive: [place], Does patient want to make changes to advanced directive?: No - Patient declined  Chief Complaint  Patient presents with  . Acute Visit    Patient c/o complaining about mouth sores and shortness of breath    HPI: Patient is a 82 y.o. female seen in the office today for an acute visit for mouth sores and shortness of breath.    Says she is the same as last week.  Mouth is "all broke out" and roof of her mouth is so sore, she cannot eat.  Can only eat soft foods.  Has torus on roof of mouth.  Nursing staff, myself and CMAs here cannot see the ulcers.  Pt thinks she is allergic to the nebulizers.    She is still short of breath.  Has pulmonary referral pending.      Review of Systems:  Review of Systems  Constitutional: Positive for weight loss and malaise/fatigue. Negative for fever and chills.  HENT:       Mouth "ulcers"; torus on roof of mouth  Respiratory: Positive for cough, sputum production, shortness of breath and wheezing.   Neurological: Positive for weakness.  Psychiatric/Behavioral: Positive for memory loss.    Past Medical History  Diagnosis Date  . COPD (chronic obstructive pulmonary disease) ([place])   . CHF (congestive heart failure) ([place])   . Edema   . Myocardial infarction ([place])   . Anxiety   . Hypertension   . Anemia   . Leukocytosis   . Acid reflux   . Osteoporosis   . Sinus drainage   . Weakness   . Unspecified intestinal obstruction ([place])   . Unspecified constipation   . Type II or unspecified type diabetes mellitus without mention of complication, not stated as uncontrolled   . Solitary pulmonary nodule   . Other abnormal blood chemistry     . Acute bronchitis   . Adult failure to thrive   . Loss of weight   . Unspecified fall   . Insomnia, unspecified   . Insomnia, unspecified   . Open wound of scalp, without mention of complication   . Atrial fibrillation ([place])   . Edema   . Carbuncle and furuncle of other specified sites   . Personal history of methicillin resistant Staphylococcus aureus   . Impacted cerumen   . Chronic airway obstruction, not elsewhere classified   . Coronary atherosclerosis of native coronary artery   . Leukocytosis, unspecified   . Anxiety state, unspecified   . Depressive disorder, not elsewhere classified   . Unspecified essential hypertension   . Cardiomegaly   . Unspecified pleural effusion   . Unspecified pleural effusion   . Esophagitis, unspecified   . Reflux esophagitis   . Duodenal ulcer, unspecified as acute or chronic, without hemorrhage, perforation, or obstruction   . Diaphragmatic hernia without mention of obstruction or gangrene   . Senile osteoporosis   . Abnormal involuntary movements(781.0)   . Abnormality of gait   . Loss of weight   . Other dyspnea and respiratory abnormality     Past Surgical History  Procedure Laterality Date  . Laparotomy  04/04/2012    Procedure: EXPLORATORY LAPAROTOMY;  Surgeon: Edsel Petrin  Dalbert Batman, MD;  Location: [facility];  Service: General;  Laterality: N/A;  . Abdominal hysterectomy  1991  . Hip surgery    . Fracture surgery  2011    hip    No Known Allergies    Medication List       This list is accurate as of: 07/26/15  1:41 PM.  Always use your most recent med list.               acetaminophen 325 MG tablet  Commonly known as:  TYLENOL  Take 650 mg by mouth every 4 (four) hours as needed.     albuterol 108 (90 Base) MCG/ACT inhaler  Commonly known as:  PROVENTIL HFA;VENTOLIN HFA  Inhale 2 puffs into the lungs every 6 (six) hours as needed for wheezing or shortness of breath.     AMBULATORY NON FORMULARY MEDICATION  Peridex Gel  Apply to ulcerated area on roof of mouth three times after meals     aspirin 81 MG tablet  Take 81 mg by mouth daily.     diltiazem 240 MG 24 hr capsule  Commonly known as:  CARDIZEM CD  TAKE 1 CAP BY MOUTH ONCE DAILY     docusate sodium 100 MG capsule  Commonly known as:  COLACE  Take one tablet once daily     guaiFENesin 600 MG 12 hr tablet  Commonly known as:  MUCINEX  Take 1 tablet (600 mg total) by mouth daily after breakfast.     hydrochlorothiazide 25 MG tablet  Commonly known as:  HYDRODIURIL  TAKE 1 TABLET DAILY FOR EDEMA     hydrocortisone 25 MG suppository  Commonly known as:  ANUSOL-HC  Place 1 suppository (25 mg total) rectally daily as needed for hemorrhoids.     ipratropium-albuterol 0.5-2.5 (3) MG/3ML Soln  Commonly known as:  DUONEB  Inhale contents of One amp via nebulizer every 6 hours for wheezing and shortness of breath. Dx:J44.9     lidocaine 5 %  Commonly known as:  LIDODERM  Place 1 patch onto the skin daily. To right shoulder; Remove & Discard patch within 12 hours or as directed by MD     mirtazapine 15 MG tablet  Commonly known as:  REMERON  TAKE 1 TABLET AT BEDTIME     omeprazole 20 MG capsule  Commonly known as:  PRILOSEC  TAKE 1 CAPSULE DAILY TO REDUCE STOMACH ACID AND TO HELP PROTECT ESOPHAGUS     polyethylene glycol packet  Commonly known as:  MIRALAX / GLYCOLAX  Take 17 g by mouth daily as needed. constipation     protein supplement Powd  Take 1 scoop by mouth 3 (three) times daily with meals.     psyllium 58.6 % powder  Commonly known as:  METAMUCIL  Take 1 packet by mouth daily.     SENEXON-S 8.6-50 MG tablet  Generic drug:  senna-docusate  TAKE 1 TAB BY MOUTH EVERY DAY AS NEEDED FOR CONSTIPATION     sertraline 25 MG tablet  Commonly known as:  ZOLOFT  Take 1 tablet (25 mg total) by mouth daily.     trolamine salicylate 10 % cream  Commonly known as:  ASPERCREME  Apply 1 application topically 3 (three) times daily. X 1  week then every 4 hours as needed     Umeclidinium Bromide 62.5 MCG/INH Aepb  Commonly known as:  INCRUSE ELLIPTA  Inhale 1 puff into the lungs daily.     Vitamin D (Ergocalciferol) 50000  units Caps capsule  Commonly known as:  DRISDOL  Take 1 capsule (50,000 Units total) by mouth every 7 (seven) days.        Health Maintenance  Topic Date Due  . OPHTHALMOLOGY EXAM  12/31/1926  . URINE MICROALBUMIN  12/31/1926  . TETANUS/TDAP  12/31/1935  . HEMOGLOBIN A1C  02/18/2015  . FOOT EXAM  05/22/2015  . INFLUENZA VACCINE  02/01/2016  . DEXA SCAN  Completed  . ZOSTAVAX  Completed  . PNA vac Low Risk Adult  Completed    Physical Exam: Filed Vitals:   07/26/15 1314  BP: 136/72  Pulse: 62  Temp: 97.6 F (36.4 C)  TempSrc: Oral  Resp: 20  Height: 5\' 1"  (1.549 m)  Weight: 85 lb 6.4 oz (38.737 kg)  SpO2: 95%   Body mass index is 16.14 kg/(m^2). Physical Exam  Constitutional: No distress.  Pt with dementia here alone for appt that was supposed to be canceled when she and her daughter-in-law left last week  HENT:  Still can't see the ulcers; torus on roof of mouth  Cardiovascular: Normal rate, regular rhythm, normal heart sounds and intact distal pulses.   Pulmonary/Chest: Effort normal. She has wheezes.  Neurological: She is alert.  Oriented to person and place only  Psychiatric: She has a normal mood and affect.    Labs reviewed: Basic Metabolic Panel:  Recent Labs  08/20/14 1422 11/12/14 1454  NA 138 141  K 4.4 3.6  CL 92* 96*  CO2 29 29  GLUCOSE 119* 118*  BUN 16 17  CREATININE 0.88 0.90  CALCIUM 11.2* 10.1   Liver Function Tests:  Recent Labs  08/20/14 1422  AST 13  ALT 8  ALKPHOS 87  BILITOT 0.5  PROT 6.7  ALBUMIN 3.9    Recent Labs  08/20/14 1422  LIPASE 14  AMYLASE 35   No results for input(s): AMMONIA in the last 8760 hours. CBC:  Recent Labs  08/20/14 1422 11/12/14 1454  WBC 23.6* 15.0*  NEUTROABS 17.4* 8.1*  HGB 12.7  --   HCT  37.5 40.4  MCV 89 87  PLT 481* 486*   Lipid Panel: No results for input(s): CHOL, HDL, LDLCALC, TRIG, CHOLHDL, LDLDIRECT in the last 8760 hours. Lab Results  Component Value Date   HGBA1C 6.6* 08/20/2014    Assessment/Plan 1. Chronic obstructive pulmonary disease, unspecified COPD type ([place]) - has pulmonary appt upcoming - I have educated them on COPD (seems like a pink puffer type) again and again -needs optimal therapy but also may not get back to not being short of breath as she is 82 yo and is not adherent due to her dementia (the biggest problem here) - levalbuterol (XOPENEX) 0.63 MG/3ML nebulizer solution; Take 3 mLs (0.63 mg total) by nebulization every 6 (six) hours as needed for wheezing or shortness of breath.  Dispense: 3 mL; Refill: 12  2. Mouth ulcers - pt is adamant that her duonebs cause the ulcers  - I am not sure what else to do about this when typical interventions have had mixed results and I cannot get a good history from pt -called [facility] and DNS there is also at a loss -so d/c duonebs and go to xopenex -cont incruse, not using albuterol inhaler anyway b/c cannot keep at bedside or overuses and becomes tachy and it's against [place] rules - magic mouthwash w/lidocaine SOLN; Take 5 mLs by mouth 4 (four) times daily as needed for mouth pain. After neb treatments  Dispense: 120 mL; Refill: 3  3.  AD w/o behaviors -pt has not recall of what she is told and comes back over and over for the same problems and family is not supportive of me and this diagnosis -[facility] staff report the same problems there  Next appt:  09/06/2015 med [place]. Georgeanna Radziewicz, D.O. [facility] [address], [place] [address] Cell Phone (Mon-Fri 8am-5pm):  [phone] On Call:  [phone] & follow prompts after 5pm & weekends Office Phone:  [phone] Office Fax:  [phone]

## 2015-07-30 DIAGNOSIS — M150 Primary generalized (osteo)arthritis: Secondary | ICD-10-CM | POA: Diagnosis not present

## 2015-07-30 DIAGNOSIS — M6281 Muscle weakness (generalized): Secondary | ICD-10-CM | POA: Diagnosis not present

## 2015-07-30 DIAGNOSIS — J441 Chronic obstructive pulmonary disease with (acute) exacerbation: Secondary | ICD-10-CM | POA: Diagnosis not present

## 2015-07-30 DIAGNOSIS — R279 Unspecified lack of coordination: Secondary | ICD-10-CM | POA: Diagnosis not present

## 2015-08-09 ENCOUNTER — Other Ambulatory Visit (INDEPENDENT_AMBULATORY_CARE_PROVIDER_SITE_OTHER): Payer: Medicare Other

## 2015-08-09 ENCOUNTER — Ambulatory Visit (INDEPENDENT_AMBULATORY_CARE_PROVIDER_SITE_OTHER): Payer: Medicare Other | Admitting: Internal Medicine

## 2015-08-09 ENCOUNTER — Encounter: Payer: Self-pay | Admitting: Internal Medicine

## 2015-08-09 ENCOUNTER — Ambulatory Visit (INDEPENDENT_AMBULATORY_CARE_PROVIDER_SITE_OTHER)
Admission: RE | Admit: 2015-08-09 | Discharge: 2015-08-09 | Disposition: A | Discharge status: Home / Self Care | Payer: Medicare Other | Source: Ambulatory Visit | Attending: Internal Medicine | Admitting: Internal Medicine

## 2015-08-09 VITALS — BP 112/72 | HR 54 | Ht 64.0 in | Wt 86.8 lb

## 2015-08-09 DIAGNOSIS — J449 Chronic obstructive pulmonary disease, unspecified: Secondary | ICD-10-CM | POA: Diagnosis not present

## 2015-08-09 DIAGNOSIS — R0600 Dyspnea, unspecified: Secondary | ICD-10-CM

## 2015-08-09 DIAGNOSIS — J438 Other emphysema: Secondary | ICD-10-CM

## 2015-08-09 DIAGNOSIS — R0602 Shortness of breath: Secondary | ICD-10-CM | POA: Insufficient documentation

## 2015-08-09 LAB — CBC WITH DIFFERENTIAL/PLATELET
BASOS ABS: 0.1 10*3/uL (ref 0.0–0.1)
BASOS PCT: 0.3 % (ref 0.0–3.0)
EOS PCT: 9.4 % — AB (ref 0.0–5.0)
Eosinophils Absolute: 1.6 10*3/uL — ABNORMAL HIGH (ref 0.0–0.7)
HEMATOCRIT: 40.5 % (ref 36.0–46.0)
Hemoglobin: 13.3 g/dL (ref 12.0–15.0)
LYMPHS ABS: 2.9 10*3/uL (ref 0.7–4.0)
LYMPHS PCT: 16.9 % (ref 12.0–46.0)
MCHC: 32.7 g/dL (ref 30.0–36.0)
MCV: 87.3 fl (ref 78.0–100.0)
MONOS PCT: 9.7 % (ref 3.0–12.0)
Monocytes Absolute: 1.7 10*3/uL — ABNORMAL HIGH (ref 0.1–1.0)
NEUTROS ABS: 11 10*3/uL — AB (ref 1.4–7.7)
Neutrophils Relative %: 63.7 % (ref 43.0–77.0)
Platelets: 515 10*3/uL — ABNORMAL HIGH (ref 150.0–400.0)
RBC: 4.64 Mil/uL (ref 3.87–5.11)
RDW: 13.5 % (ref 11.5–15.5)
WBC: 17.2 10*3/uL — ABNORMAL HIGH (ref 4.0–10.5)

## 2015-08-09 LAB — BASIC METABOLIC PANEL
BUN: 29 mg/dL — AB (ref 6–23)
CHLORIDE: 101 meq/L (ref 96–112)
CO2: 27 meq/L (ref 19–32)
Calcium: 9.8 mg/dL (ref 8.4–10.5)
Creatinine, Ser: 1.15 mg/dL (ref 0.40–1.20)
GFR: 46.25 mL/min — AB (ref >60.00)
GLUCOSE: 138 mg/dL — AB (ref 70–99)
POTASSIUM: 3.2 meq/L — AB (ref 3.5–5.1)
SODIUM: 141 meq/L (ref 135–145)

## 2015-08-09 LAB — TSH: TSH: 2.82 u[IU]/mL (ref 0.35–4.50)

## 2015-08-09 NOTE — Progress Notes (Signed)
Subjective:    Patient ID: Bianca Khan, female    DOB: 04-02-1915,    MRN: [id number]  HPI  74 yowm never smoker NH resident  with h/o indolent onset sob starting around 2011 dx'd as copd but failed multiple inhalers so referred to pulmonary clinic 08/09/2015 by Dr Hollace Kinnier for unexplained sob  08/09/2015 1st [facility] office visit/ Dyon Rotert   Chief Complaint  Patient presents with  . Pulmonary Consult    Referred by Dr. Hollace Kinnier for eval of COPD. Pt states she was dxed with COPD several years ago. She states that she gets SOB when walking "not too far"- but can walk to her room to the dining room at the nursing home.   no problem with breathing sitting still,  Using ? anoro daily x ? Years s apparent benefit but still able to walk to dining room fine- could never tell me what activity consistently made her sob but always "when exerting" never at rest or noct Assoc with mild HB/ no cough   No obvious other patterns in day to day or daytime variabilty or assoc  cp or chest tightness, subjective wheeze overt sinus or hb symptoms. No unusual exp hx or h/o childhood pna/ asthma or knowledge of premature birth.  Sleeping ok without nocturnal  or early am exacerbation  of respiratory  c/o's or need for noct saba. Also denies any obvious fluctuation of symptoms with weather or environmental changes or other aggravating or alleviating factors except as outlined above   Current Medications, Allergies, Complete Past Medical History, Past Surgical History, Family History, and Social History were reviewed in [employer] record.             Review of Systems  Constitutional: Positive for appetite change. Negative for fever, chills and unexpected weight change.  HENT: Negative for congestion, dental problem, ear pain, nosebleeds, postnasal drip, rhinorrhea, sinus pressure, sneezing, sore throat, trouble swallowing and voice change.   Eyes: Negative for visual  disturbance.  Respiratory: Positive for shortness of breath. Negative for cough and choking.   Cardiovascular: Negative for chest pain and leg swelling.  Gastrointestinal: Negative for vomiting, abdominal pain and diarrhea.  Genitourinary: Negative for difficulty urinating.       Acid heartburn  Musculoskeletal: Negative for arthralgias.  Skin: Negative for rash.  Neurological: Negative for tremors, syncope and headaches.  Hematological: Does not bruise/bleed easily.       Objective:   Physical Exam  Hoarse wf, easily confused with details of care  Wt Readings from Last 3 Encounters:  08/09/15 86 lb 12.8 oz (39.372 kg)  07/26/15 85 lb 6.4 oz (38.737 kg)  07/19/15 86 lb (39.009 kg)    Vital signs reviewed  HEENT: nl dentition, turbinates, and oropharynx. Nl external ear canals without cough reflex   NECK :  without JVD/Nodes/TM/ nl carotid upstrokes bilaterally   LUNGS: no acc muscle use,  Nl contour chest which is clear to A and P bilaterally without cough on insp or exp maneuvers   CV:  RRR  no s3 or murmur or increase in P2, no edema   ABD:  soft and nontender with nl inspiratory excursion in the supine position. No bruits or organomegaly, bowel sounds nl  MS:  Nl gait/ ext warm without deformities, calf tenderness, cyanosis or clubbing No obvious joint restrictions   SKIN: warm and dry without lesions    NEURO:  alert, approp, nl sensorium with  no motor deficits  CXR PA and Lateral:   08/09/2015 :    I personally reviewed images and agree with radiology impression as follows:    Hyperinflation, without acute disease.  Probable bilateral nipple shadows. Repeat frontal radiograph with nipple markers could confirm.   Labs ordered/ reviewed:      Chemistry      Component Value Date/Time   NA 141 08/09/2015 1516   NA 141 11/12/2014 1454   K 3.2* 08/09/2015 1516   CL 101 08/09/2015 1516   CO2 27 08/09/2015 1516   BUN 29* 08/09/2015 1516   BUN 17  11/12/2014 1454   CREATININE 1.15 08/09/2015 1516      Component Value Date/Time   CALCIUM 9.8 08/09/2015 1516   ALKPHOS 87 08/20/2014 1422   AST 13 08/20/2014 1422   ALT 8 08/20/2014 1422   BILITOT 0.5 08/20/2014 1422   BILITOT 0.4 01/13/2014 0822        Lab Results  Component Value Date   WBC 17.2* 08/09/2015   HGB 13.3 08/09/2015   HCT 40.5 08/09/2015   MCV 87.3 08/09/2015   PLT 515.0* 08/09/2015       Lab Results  Component Value Date   TSH 2.82 08/09/2015     Lab Results  Component Value Date   PROBNP 284.0* 08/09/2015            Assessment & Plan:

## 2015-08-09 NOTE — Patient Instructions (Signed)
Stop incruse  GERD (REFLUX)  is an extremely common cause of respiratory symptoms just like yours , many times with no obvious heartburn at all.    It can be treated with medication, but also with lifestyle changes including elevation of the head of your bed (ideally with 6 inch  bed blocks),  Smoking cessation, avoidance of late meals, excessive alcohol, and avoid fatty foods, chocolate, peppermint, colas, red wine, and acidic juices such as orange juice.  NO MINT OR MENTHOL PRODUCTS SO NO COUGH DROPS  USE SUGARLESS CANDY INSTEAD (Jolley ranchers or Stover's or Life Savers) or even ice chips will also do - the key is to swallow to prevent all throat clearing. NO OIL BASED VITAMINS - use powdered substitutes.    Continue prilosec Take 30-60 min before first meal of the day   Please remember to go to the lab and x-ray department downstairs for your tests - we will call you with the results when they are available.     If breathing worse return to clinic

## 2015-08-10 LAB — BRAIN NATRIURETIC PEPTIDE: Pro B Natriuretic peptide (BNP): 284 pg/mL — ABNORMAL HIGH (ref 0.0–100.0)

## 2015-08-10 NOTE — Progress Notes (Signed)
Quick Note:  Spoke with pt and notified of results per Dr. Wert. Pt verbalized understanding and denied any questions.  ______ 

## 2015-08-11 NOTE — Assessment & Plan Note (Signed)
08/09/2015  Walked RA x 2.5  laps @ 185 ft each stopped due to  Fatigue >> sob (min)  I could never actually to give me any specific activity which reproducibly made her sob and Symptoms are markedly disproportionate to objective findings and not clear this is a lung problem but pt does appear to have difficult airway management issues.   DDX of  difficult airways management almost all start with A and  include Adherence, Ace Inhibitors, Acid Reflux, Active Sinus Disease, Alpha 1 Antitripsin deficiency, Anxiety masquerading as Airways dz,  ABPA,  Allergy(esp in young), Aspiration (esp in elderly), Adverse effects of meds,  Active smokers, A bunch of PE's (a small clot burden can't cause this syndrome unless there is already severe underlying pulm or vascular dz with poor reserve) plus two Bs  = Bronchiectasis and Beta blocker use..and one C= CHF   Adherence is usually the first but she's in SNF   ? Acid (or non-acid) GERD > always difficult to exclude as up to 75% of pts in some series report no assoc GI/ Heartburn symptoms> rec max (24h)  acid suppression and diet restrictions/ reviewed and instructions given in writing.   ? Allergies/ asthma > lack of variability / noct symptoms/ cough strongly against and this is clearly not copd  ? Adverse effect of dpi so try off incruse as this is not copd (interesting neither this product nor any of it's competitors were ever studied in a population of "copd" pts without a smoking hx of at least 10pkyrs  ? Bronchiectasis >  Absence of a cough hx is strongly against   ? CHF > could have diastolic dysfunction with moderate elevation of bnp so might want to look at doing an echo at some point    Total time devoted to counseling  = 35/44m review case with pt/ discussion of options/alternatives/ personally creating in presence of pt  then going over specific  Instructions directly with the pt including how to use all of the meds but in particular covering each  new medication in detail (see avs)

## 2015-08-11 NOTE — Assessment & Plan Note (Signed)
The natural aging process of the lung leads to a reduction in elasticity and hyper expansion on cxr but I've never it seen this have significant clinical impact in a never smoker, perhaps because it occurs in pts who are living into their 80's/ and 90's (and she may hit a hundred but are less and less active and have lower ventilatory demands and they don't have the airway component most smoking related copd shares, so that may be why they don't respond to rx like the copd population from smoking would be expected to respond.  No pulmonary f/u  Needed unless breathing gets worse reproducible when she walks the hallways at [facility]

## 2015-08-12 ENCOUNTER — Other Ambulatory Visit: Payer: Self-pay | Admitting: *Deleted

## 2015-08-12 MED ORDER — AMBULATORY NON FORMULARY MEDICATION
Status: DC
Start: 2015-08-12 — End: 2015-08-12

## 2015-08-12 MED ORDER — AMBULATORY NON FORMULARY MEDICATION: Status: DC

## 2015-08-12 NOTE — Telephone Encounter (Signed)
Labs and Rx sent to [facility].

## 2015-08-13 ENCOUNTER — Other Ambulatory Visit: Payer: Self-pay | Admitting: *Deleted

## 2015-08-13 MED ORDER — AMBULATORY NON FORMULARY MEDICATION: Status: DC

## 2015-08-13 NOTE — Telephone Encounter (Signed)
Printed Rx and faxed to [employer] at [facility]: [phone]

## 2015-08-19 ENCOUNTER — Other Ambulatory Visit: Payer: Self-pay | Admitting: *Deleted

## 2015-08-19 MED ORDER — DILTIAZEM HCL ER COATED BEADS 240 MG PO CP24: ORAL_CAPSULE | ORAL | Status: DC

## 2015-08-19 NOTE — Telephone Encounter (Signed)
[facility]

## 2015-09-06 ENCOUNTER — Ambulatory Visit: Payer: Medicare Other | Admitting: Internal Medicine

## 2015-09-10 ENCOUNTER — Encounter: Payer: Self-pay | Admitting: Internal Medicine

## 2015-09-10 ENCOUNTER — Ambulatory Visit (INDEPENDENT_AMBULATORY_CARE_PROVIDER_SITE_OTHER): Payer: Medicare Other | Admitting: Internal Medicine

## 2015-09-10 VITALS — BP 130/64 | HR 79 | Temp 98.2°F | Wt 83.0 lb

## 2015-09-10 DIAGNOSIS — I5032 Chronic diastolic (congestive) heart failure: Secondary | ICD-10-CM

## 2015-09-10 DIAGNOSIS — M546 Pain in thoracic spine: Secondary | ICD-10-CM | POA: Diagnosis not present

## 2015-09-10 DIAGNOSIS — K5901 Slow transit constipation: Secondary | ICD-10-CM | POA: Diagnosis not present

## 2015-09-10 DIAGNOSIS — F0280 Dementia in other diseases classified elsewhere without behavioral disturbance: Secondary | ICD-10-CM

## 2015-09-10 DIAGNOSIS — K1379 Other lesions of oral mucosa: Secondary | ICD-10-CM

## 2015-09-10 DIAGNOSIS — E1142 Type 2 diabetes mellitus with diabetic polyneuropathy: Secondary | ICD-10-CM | POA: Diagnosis not present

## 2015-09-10 DIAGNOSIS — G301 Alzheimer's disease with late onset: Secondary | ICD-10-CM | POA: Diagnosis not present

## 2015-09-10 DIAGNOSIS — J438 Other emphysema: Secondary | ICD-10-CM

## 2015-09-10 MED ORDER — CAMPHOR-MENTHOL-METHYL SAL 1.2-5.7-6.3 % EX PTCH: 1.0000 | MEDICATED_PATCH | Freq: Every day | CUTANEOUS | Status: DC

## 2015-09-10 MED ORDER — SENNOSIDES-DOCUSATE SODIUM 8.6-50 MG PO TABS: 1.0000 | ORAL_TABLET | ORAL | Status: DC | PRN

## 2015-09-10 NOTE — Progress Notes (Signed)
Patient ID: Bianca Khan, female   DOB: 1915-03-23, 82 y.o.   MRN: [id number]   Location:  [facility] clinic Provider:  Kassady Laboy L. Mariea Clonts, D.O., C.M.D.  Code Status: DNR Goals of Care:  Advanced Directives 09/10/2015  Does patient have an advance directive? Yes  Type of Paramedic of [place];Out of facility DNR (pink MOST or yellow form)  Does patient want to make changes to advanced directive? -  Copy of advanced directive(s) in chart? Yes  Pre-existing out of facility DNR order (yellow form or pink MOST form) Yellow form placed in chart (order not valid for inpatient use)     Chief Complaint  Patient presents with  . Medical Management of Chronic Issues    3 MTH FOLLOW-UP    HPI: Patient is a 82 y.o. female seen today for medical management of chronic diseases.    Continues to lose weight. Is down to 83.8 lbs from 87 lbs.  Doesn't eat much.  She does drink a lot of fluids and says she is eating the vanilla magic cups.    She remains sob with walking (due to her COPD and advanced age).    She has had hypokalemia but does not remember that she NEEDS the potassium supplement--cannot swallow the pills and dislikes the liquid flavor so staff are diluting it to get it in her at [facility].  She reports she is having loose bms each day and it is interfering with her ability to participate in activities--has urgency and fears she'll have an accident.  Previously, she'd been very constipated and has gotten relief with use of the metamucil plus daily prn senna s and miralax.    She has a sore area in her right shoulder blade.  Unclear what caused this.    Past Medical History  Diagnosis Date  . COPD (chronic obstructive pulmonary disease) ([place])   . CHF (congestive heart failure) ([place])   . Edema   . Myocardial infarction ([place])   . Anxiety   . Hypertension   . Anemia   . Leukocytosis   . Acid reflux   . Osteoporosis   . Sinus drainage   . Weakness   . Unspecified  intestinal obstruction ([place])   . Unspecified constipation   . Type II or unspecified type diabetes mellitus without mention of complication, not stated as uncontrolled   . Solitary pulmonary nodule   . Other abnormal blood chemistry   . Acute bronchitis   . Adult failure to thrive   . Loss of weight   . Unspecified fall   . Insomnia, unspecified   . Insomnia, unspecified   . Open wound of scalp, without mention of complication   . Atrial fibrillation ([place])   . Edema   . Carbuncle and furuncle of other specified sites   . Personal history of methicillin resistant Staphylococcus aureus   . Impacted cerumen   . Chronic airway obstruction, not elsewhere classified   . Coronary atherosclerosis of native coronary artery   . Leukocytosis, unspecified   . Anxiety state, unspecified   . Depressive disorder, not elsewhere classified   . Unspecified essential hypertension   . Cardiomegaly   . Unspecified pleural effusion   . Unspecified pleural effusion   . Esophagitis, unspecified   . Reflux esophagitis   . Duodenal ulcer, unspecified as acute or chronic, without hemorrhage, perforation, or obstruction   . Diaphragmatic hernia without mention of obstruction or gangrene   . Senile osteoporosis   .  Abnormal involuntary movements(781.0)   . Abnormality of gait   . Loss of weight   . Other dyspnea and respiratory abnormality     Past Surgical History  Procedure Laterality Date  . Laparotomy  04/04/2012    Procedure: EXPLORATORY LAPAROTOMY;  Surgeon: Adin Hector, MD;  Location: [facility] [place];  Service: General;  Laterality: N/A;  . Abdominal hysterectomy  1991  . Hip surgery    . Fracture surgery  2011    hip    No Known Allergies    Medication List       This list is accurate as of: 09/10/15 11:13 AM.  Always use your most recent med list.               acetaminophen 325 MG tablet  Commonly known as:  TYLENOL  Take 650 mg by mouth every 4 (four) hours as needed.      albuterol 108 (90 Base) MCG/ACT inhaler  Commonly known as:  PROVENTIL HFA;VENTOLIN HFA  Inhale 2 puffs into the lungs every 6 (six) hours as needed for wheezing or shortness of breath.     aspirin 81 MG tablet  Take 81 mg by mouth daily.     benzocaine 10 % mucosal gel  Commonly known as:  ORAJEL  Use as directed 1 application in the mouth or throat as needed for mouth pain.     diltiazem 240 MG 24 hr capsule  Commonly known as:  CARDIZEM CD  Take one capsule by mouth once daily     docusate sodium 100 MG capsule  Commonly known as:  COLACE  Take one tablet once daily     guaiFENesin 600 MG 12 hr tablet  Commonly known as:  MUCINEX  Take 1 tablet (600 mg total) by mouth daily after breakfast.     hydrochlorothiazide 25 MG tablet  Commonly known as:  HYDRODIURIL  TAKE 1 TABLET DAILY FOR EDEMA     hydrocortisone 25 MG suppository  Commonly known as:  ANUSOL-HC  Place 25 mg rectally daily as needed for hemorrhoids or itching.     levalbuterol 0.63 MG/3ML nebulizer solution  Commonly known as:  XOPENEX  Take 3 mLs (0.63 mg total) by nebulization every 6 (six) hours as needed for wheezing or shortness of breath.     magic mouthwash w/lidocaine Soln  Take 5 mLs by mouth 4 (four) times daily as needed for mouth pain. After neb treatments     mirtazapine 15 MG tablet  Commonly known as:  REMERON  TAKE 1 TABLET AT BEDTIME     omeprazole 20 MG capsule  Commonly known as:  PRILOSEC  TAKE 1 CAPSULE DAILY TO REDUCE STOMACH ACID AND TO HELP PROTECT ESOPHAGUS     polyethylene glycol packet  Commonly known as:  MIRALAX / GLYCOLAX  Take 17 g by mouth daily as needed. constipation     potassium chloride 20 MEQ/15ML (10%) Soln  Take 20 mEq by mouth daily.     SENEXON-S 8.6-50 MG tablet  Generic drug:  senna-docusate  TAKE 1 TAB BY MOUTH EVERY DAY AS NEEDED FOR CONSTIPATION     sertraline 25 MG tablet  Commonly known as:  ZOLOFT  Take 1 tablet (25 mg total) by mouth daily.       trolamine salicylate 10 % cream  Commonly known as:  ASPERCREME  Apply 1 application topically 3 (three) times daily. X 1 week then every 4 hours as needed     Vitamin D3  2000 units capsule  Take 2,000 Units by mouth daily.        Review of Systems:  Review of Systems  Constitutional: Positive for weight loss and malaise/fatigue. Negative for fever and chills.  HENT: Negative for congestion.        Mouth "sores" are better  Eyes: Negative for blurred vision.  Respiratory: Positive for cough and shortness of breath. Negative for sputum production and wheezing.   Cardiovascular: Negative for chest pain and leg swelling.  Gastrointestinal: Negative for abdominal pain, diarrhea, constipation, blood in stool and melena.       Hemorrhoids, now loose urgent bms with metamucil daily  Genitourinary: Negative for dysuria.  Musculoskeletal: Positive for myalgias. Negative for falls.       Right shoulder  Skin: Negative for rash.  Neurological: Positive for weakness. Negative for dizziness and loss of consciousness.       Walks with walker  Psychiatric/Behavioral: Positive for memory loss. Negative for depression.    Health Maintenance  Topic Date Due  . OPHTHALMOLOGY EXAM  12/31/1926  . URINE MICROALBUMIN  12/31/1926  . TETANUS/TDAP  12/31/1935  . MAMMOGRAM  08/17/2012  . HEMOGLOBIN A1C  02/18/2015  . FOOT EXAM  05/22/2015  . INFLUENZA VACCINE  02/01/2016  . DEXA SCAN  Completed  . ZOSTAVAX  Completed  . PNA vac Low Risk Adult  Completed    Physical Exam: Filed Vitals:   09/10/15 1046  BP: 130/64  Pulse: 79  Temp: 98.2 F (36.8 C)  TempSrc: Oral  Weight: 83 lb (37.649 kg)  SpO2: 96%   Body mass index is 14.24 kg/(m^2). Physical Exam  Constitutional:  Frail white female walks with walker  Cardiovascular: Normal rate, regular rhythm and normal heart sounds.   Pulmonary/Chest: Effort normal. She has wheezes.  Abdominal: Soft. Bowel sounds are normal.   Musculoskeletal: She exhibits tenderness.  Right posterior scapular area, no rash  Neurological: She is alert.  Short term memory loss  Skin: Skin is warm and dry.  Psychiatric: She has a normal mood and affect.    Labs reviewed: Basic Metabolic Panel:  Recent Labs  11/12/14 1454 08/09/15 1516  NA 141 141  K 3.6 3.2*  CL 96* 101  CO2 29 27  GLUCOSE 118* 138*  BUN 17 29*  CREATININE 0.90 1.15  CALCIUM 10.1 9.8  TSH  --  2.82   Liver Function Tests: No results for input(s): AST, ALT, ALKPHOS, BILITOT, PROT, ALBUMIN in the last 8760 hours. No results for input(s): LIPASE, AMYLASE in the last 8760 hours. No results for input(s): AMMONIA in the last 8760 hours. CBC:  Recent Labs  11/12/14 1454 08/09/15 1516  WBC 15.0* 17.2*  NEUTROABS 8.1* 11.0*  HGB  --  13.3  HCT 40.4 40.5  MCV 87 87.3  PLT 486* 515.0*   Lipid Panel: No results for input(s): CHOL, HDL, LDLCALC, TRIG, CHOLHDL, LDLDIRECT in the last 8760 hours. Lab Results  Component Value Date   HGBA1C 6.6* 08/20/2014    Procedures since last visit: CXR from Dr. Gustavus Bryant office:  Reviewed.    Assessment/Plan 1. Other emphysema ([place]) - off inhalers, cont xopenex nebs if needed (but she's not been using it sounds like) -there were no interventions for her at her age  71. Sore mouth -better, unclear what truly did cause this  3. Type 2 diabetes mellitus with diabetic polyneuropathy, without long-term current use of insulin (HCC) -has been stable, trying to keep diet as liberal as possible due  to her frail 83.8lbs -last hba1c just fine at 6.6  4. Chronic diastolic congestive heart failure, NYHA class 1 ([place]) -this may have progressed some, as well, and could be contributing to her dyspnea but her bp won't tolerate any additional meds and she's very high risk of falls  5. Late onset Alzheimer's disease without behavioral disturbance -with severe short term memory loss -remains active and verbal and  ambulatory, but does not remember well  6. Slow transit constipation -decrease metamucil to every other day as well as the senna s, miralax is prn - senna-docusate (SENEXON-S) 8.6-50 MG tablet; Take 1 tablet by mouth every other day as needed for moderate constipation.  Dispense: 30 tablet; Refill: 5  7. Right-sided thoracic back pain - advised to try otc patches with lidocaine as she does not have a diagnosis where insurance would cover prescription strength lidoderm  -pain seems very minor and probably will resolve on its own in just a couple of days - Camphor-Menthol-Methyl Sal 1.2-5.7-6.3 % PTCH; Apply 1 patch topically daily.  Dispense: 40 patch; Refill: 3  Labs/tests ordered:  No orders of the defined types were placed in this encounter.   Next appt:  12/16/2015 med mgt--will need urine microalbumin done and diabetic foot exam  Janet Decesare L. Jakari Jacot, D.O. [facility] [address], [place] [address] Cell Phone (Mon-Fri 8am-5pm):  [phone] On Call:  [phone] & follow prompts after 5pm & weekends Office Phone:  [phone] Office Fax:  [phone]

## 2015-09-23 ENCOUNTER — Telehealth: Payer: Self-pay | Admitting: *Deleted

## 2015-09-23 NOTE — Telephone Encounter (Signed)
Fax from [place] at [facility], resident has diarrhea can they get and order for her to take something possibly have it PRN?  Per Dr. Mariea Clonts, hold all constipation meds fiber supplements stool softeners miralax, if already being done please call the office and ask to speak with [facility], if we do hold the bowel regimen and loose stools persist call the office.  ( order to be scanned and faxed)

## 2015-09-25 ENCOUNTER — Encounter (HOSPITAL_COMMUNITY): Payer: Self-pay | Admitting: Emergency Medicine

## 2015-09-25 ENCOUNTER — Emergency Department (HOSPITAL_COMMUNITY)
Admission: EM | Admit: 2015-09-25 | Discharge: 2015-09-25 | Disposition: A | Discharge status: Home / Self Care | Payer: Medicare Other | Attending: Emergency Medicine | Admitting: Emergency Medicine

## 2015-09-25 ENCOUNTER — Emergency Department (HOSPITAL_COMMUNITY): Payer: Medicare Other

## 2015-09-25 DIAGNOSIS — Z79899 Other long term (current) drug therapy: Secondary | ICD-10-CM | POA: Diagnosis not present

## 2015-09-25 DIAGNOSIS — Z862 Personal history of diseases of the blood and blood-forming organs and certain disorders involving the immune mechanism: Secondary | ICD-10-CM | POA: Insufficient documentation

## 2015-09-25 DIAGNOSIS — M810 Age-related osteoporosis without current pathological fracture: Secondary | ICD-10-CM | POA: Diagnosis not present

## 2015-09-25 DIAGNOSIS — Z9181 History of falling: Secondary | ICD-10-CM | POA: Diagnosis not present

## 2015-09-25 DIAGNOSIS — I2510 Atherosclerotic heart disease of native coronary artery without angina pectoris: Secondary | ICD-10-CM | POA: Insufficient documentation

## 2015-09-25 DIAGNOSIS — I509 Heart failure, unspecified: Secondary | ICD-10-CM | POA: Diagnosis not present

## 2015-09-25 DIAGNOSIS — Z872 Personal history of diseases of the skin and subcutaneous tissue: Secondary | ICD-10-CM | POA: Diagnosis not present

## 2015-09-25 DIAGNOSIS — Z87828 Personal history of other (healed) physical injury and trauma: Secondary | ICD-10-CM | POA: Insufficient documentation

## 2015-09-25 DIAGNOSIS — E119 Type 2 diabetes mellitus without complications: Secondary | ICD-10-CM | POA: Diagnosis not present

## 2015-09-25 DIAGNOSIS — J449 Chronic obstructive pulmonary disease, unspecified: Secondary | ICD-10-CM | POA: Diagnosis not present

## 2015-09-25 DIAGNOSIS — F329 Major depressive disorder, single episode, unspecified: Secondary | ICD-10-CM | POA: Diagnosis not present

## 2015-09-25 DIAGNOSIS — K219 Gastro-esophageal reflux disease without esophagitis: Secondary | ICD-10-CM | POA: Insufficient documentation

## 2015-09-25 DIAGNOSIS — R52 Pain, unspecified: Secondary | ICD-10-CM | POA: Diagnosis not present

## 2015-09-25 DIAGNOSIS — F419 Anxiety disorder, unspecified: Secondary | ICD-10-CM | POA: Diagnosis not present

## 2015-09-25 DIAGNOSIS — Z8669 Personal history of other diseases of the nervous system and sense organs: Secondary | ICD-10-CM | POA: Diagnosis not present

## 2015-09-25 DIAGNOSIS — Z8614 Personal history of Methicillin resistant Staphylococcus aureus infection: Secondary | ICD-10-CM | POA: Diagnosis not present

## 2015-09-25 DIAGNOSIS — M25519 Pain in unspecified shoulder: Secondary | ICD-10-CM | POA: Diagnosis not present

## 2015-09-25 DIAGNOSIS — I10 Essential (primary) hypertension: Secondary | ICD-10-CM | POA: Diagnosis not present

## 2015-09-25 DIAGNOSIS — Z7982 Long term (current) use of aspirin: Secondary | ICD-10-CM | POA: Diagnosis not present

## 2015-09-25 DIAGNOSIS — I4891 Unspecified atrial fibrillation: Secondary | ICD-10-CM | POA: Insufficient documentation

## 2015-09-25 DIAGNOSIS — M25511 Pain in right shoulder: Secondary | ICD-10-CM | POA: Diagnosis not present

## 2015-09-25 DIAGNOSIS — I252 Old myocardial infarction: Secondary | ICD-10-CM | POA: Insufficient documentation

## 2015-09-25 MED ORDER — ACETAMINOPHEN 500 MG PO TABS
1000.0000 mg | ORAL_TABLET | Freq: Once | ORAL | Status: AC
  Administered 2015-09-25: 1000 mg via ORAL
  Filled 2015-09-25: qty 2

## 2015-09-25 NOTE — Discharge Instructions (Signed)
Your x-rays concerning for rotator cuff injury. Urine a sling for comfort. He needed to take your arm out of the sling at least 3 times a day to do range of motion exercises so that you don't get frozen shoulder. This will be much worse than what you have right now. Tylenol 1-2 tabs po q4h prn  Shoulder Pain The shoulder is the joint that connects your arm to your body. Muscles and band-like tissues that connect bones to muscles (tendons) hold the joint together. Shoulder pain is felt if an injury or medical problem affects one or more parts of the shoulder. HOME CARE   Put ice on the sore area.  Put ice in a plastic bag.  Place a towel between your skin and the bag.  Leave the ice on for 15-20 minutes, 03-04 times a day for the first 2 days.  Stop using cold packs if they do not help with the pain.  If you were given something to keep your shoulder from moving (sling; shoulder immobilizer), wear it as told. Only take it off to shower or bathe.  Move your arm as little as possible, but keep your hand moving to prevent puffiness (swelling).  Squeeze a soft ball or foam pad as much as possible to help prevent swelling.  Take medicine as told by your doctor. GET HELP IF:  You have progressing new pain in your arm, hand, or fingers.  Your hand or fingers get cold.  Your medicine does not help lessen your pain. GET HELP RIGHT AWAY IF:   Your arm, hand, or fingers are numb or tingling.  Your arm, hand, or fingers are puffy (swollen), painful, or turn white or blue. MAKE SURE YOU:   Understand these instructions.  Will watch your condition.  Will get help right away if you are not doing well or get worse.   This information is not intended to replace advice given to you by your health care provider. Make sure you discuss any questions you have with your health care provider.   Document Released: 12/06/2007 Document Revised: 07/10/2014 Document Reviewed: 10/12/2014 Elsevier  Interactive Patient Education [employer].

## 2015-09-25 NOTE — ED Notes (Signed)
Bed: [id number] Expected date:  Expected time:  Means of arrival:  Comments: EMS- R shoulder pain

## 2015-09-25 NOTE — ED Provider Notes (Signed)
CSN: [id number]     Arrival date & time 09/25/15  1110 History   First MD Initiated Contact with Patient 09/25/15 1116     Chief Complaint  Patient presents with  . Shoulder Pain     (Consider location/radiation/quality/duration/timing/severity/associated sxs/prior Treatment) Patient is a 82 y.o. female presenting with shoulder pain. The history is provided by the patient.  Shoulder Pain Location:  Shoulder Time since incident:  1 day Injury: no   Shoulder location:  R shoulder Pain details:    Quality:  Sharp and shooting   Severity:  Severe   Onset quality:  Sudden   Duration:  1 day   Timing:  Constant   Progression:  Unchanged Chronicity:  New Prior injury to area:  No Relieved by:  Nothing Worsened by:  Nothing tried Ineffective treatments:  None tried Associated symptoms: decreased range of motion   Associated symptoms: no fever    82 yo F With a chief complaint right shoulder pain. This haven't spontaneously while she was sleeping. Patient having significant pain to the posterior aspect of the shoulder. Limited range of motion as she tries to lift it causes her severe pain above about 90. Denies any injury denies new exercises classes. Patient denies any other areas of pain. No radiation. Sharp stabbing pain.  Past Medical History  Diagnosis Date  . COPD (chronic obstructive pulmonary disease) ([place])   . CHF (congestive heart failure) ([place])   . Edema   . Myocardial infarction ([place])   . Anxiety   . Hypertension   . Anemia   . Leukocytosis   . Acid reflux   . Osteoporosis   . Sinus drainage   . Weakness   . Unspecified intestinal obstruction ([place])   . Unspecified constipation   . Type II or unspecified type diabetes mellitus without mention of complication, not stated as uncontrolled   . Solitary pulmonary nodule   . Other abnormal blood chemistry   . Acute bronchitis   . Adult failure to thrive   . Loss of weight   . Unspecified fall   . Insomnia,  unspecified   . Insomnia, unspecified   . Open wound of scalp, without mention of complication   . Atrial fibrillation ([place])   . Edema   . Carbuncle and furuncle of other specified sites   . Personal history of methicillin resistant Staphylococcus aureus   . Impacted cerumen   . Chronic airway obstruction, not elsewhere classified   . Coronary atherosclerosis of native coronary artery   . Leukocytosis, unspecified   . Anxiety state, unspecified   . Depressive disorder, not elsewhere classified   . Unspecified essential hypertension   . Cardiomegaly   . Unspecified pleural effusion   . Unspecified pleural effusion   . Esophagitis, unspecified   . Reflux esophagitis   . Duodenal ulcer, unspecified as acute or chronic, without hemorrhage, perforation, or obstruction   . Diaphragmatic hernia without mention of obstruction or gangrene   . Senile osteoporosis   . Abnormal involuntary movements(781.0)   . Abnormality of gait   . Loss of weight   . Other dyspnea and respiratory abnormality    Past Surgical History  Procedure Laterality Date  . Laparotomy  04/04/2012    Procedure: EXPLORATORY LAPAROTOMY;  Surgeon: Adin Hector, MD;  Location: [facility];  Service: General;  Laterality: N/A;  . Abdominal hysterectomy  1991  . Hip surgery    . Fracture surgery  2011    hip  Family History  Problem Relation Age of Onset  . Stroke Father    Social History  Substance Use Topics  . Smoking status: Never Smoker   . Smokeless tobacco: Never Used  . Alcohol Use: Yes     Comment: rarely   OB History    No data available     Review of Systems  Constitutional: Negative for fever and chills.  HENT: Negative for congestion and rhinorrhea.   Eyes: Negative for redness and visual disturbance.  Respiratory: Negative for shortness of breath and wheezing.   Cardiovascular: Negative for chest pain and palpitations.  Gastrointestinal: Negative for nausea and vomiting.  Genitourinary:  Negative for dysuria and urgency.  Musculoskeletal: Positive for arthralgias. Negative for myalgias.  Skin: Negative for pallor and wound.  Neurological: Negative for dizziness and headaches.      Allergies  Review of patient's allergies indicates no known allergies.  Home Medications   Prior to Admission medications   Medication Sig Start Date End Date Taking? Authorizing Provider  acetaminophen (TYLENOL) 325 MG tablet Take 650 mg by mouth every 4 (four) hours as needed (for pain).    Yes Historical Provider, MD  albuterol (PROVENTIL HFA;VENTOLIN HFA) 108 (90 Base) MCG/ACT inhaler Inhale 2 puffs into the lungs every 6 (six) hours as needed for wheezing or shortness of breath.   Yes Historical Provider, MD  aspirin 81 MG chewable tablet Chew 81 mg by mouth daily.   Yes Historical Provider, MD  benzocaine (ORAJEL) 10 % mucosal gel Use as directed 1 application in the mouth or throat as needed for mouth pain.   Yes Historical Provider, MD  Camphor-Menthol-Methyl Sal 1.2-5.7-6.3 % PTCH Apply 1 patch topically daily. 09/10/15  Yes Tiffany L Reed, DO  Cholecalciferol (VITAMIN D3) 2000 units capsule Take 2,000 Units by mouth daily.   Yes Historical Provider, MD  diltiazem (CARDIZEM CD) 240 MG 24 hr capsule Take one capsule by mouth once daily Patient taking differently: Take 240 mg by mouth daily.  08/19/15  Yes Tiffany L Reed, DO  docusate sodium (COLACE) 100 MG capsule Take 100 mg by mouth daily as needed for mild constipation.    Yes Historical Provider, MD  guaiFENesin (MUCINEX) 600 MG 12 hr tablet Take 1 tablet (600 mg total) by mouth daily after breakfast. Patient taking differently: Take 600 mg by mouth daily as needed for to loosen phlegm.  02/14/13  Yes Tiffany L Reed, DO  hydrochlorothiazide (HYDRODIURIL) 25 MG tablet TAKE 1 TABLET DAILY FOR EDEMA 11/16/14  Yes Tiffany L Reed, DO  hydrocortisone (ANUSOL-HC) 25 MG suppository Place 25 mg rectally daily as needed (for itching caused my  hemorrhoids).    Yes Historical Provider, MD  levalbuterol Penne Lash) 0.63 MG/3ML nebulizer solution Take 3 mLs (0.63 mg total) by nebulization every 6 (six) hours as needed for wheezing or shortness of breath. 07/26/15  Yes Tiffany L Reed, DO  magic mouthwash w/lidocaine SOLN Take 5 mLs by mouth 4 (four) times daily as needed for mouth pain. After neb treatments 07/26/15  Yes Tiffany L Reed, DO  mirtazapine (REMERON) 15 MG tablet TAKE 1 TABLET AT BEDTIME 03/09/15  Yes Tiffany L Reed, DO  Nutritional Supplements (NUTRITIONAL SHAKE) LIQD Take 1 Can by mouth 3 (three) times daily between meals. *Landscape architect*   Yes Historical Provider, MD  omeprazole (PRILOSEC) 20 MG capsule TAKE 1 CAPSULE DAILY TO REDUCE STOMACH ACID AND TO HELP PROTECT ESOPHAGUS 01/15/15  Yes Tiffany L Reed, DO  polyethylene glycol (  MIRALAX / GLYCOLAX) packet Take 17 g by mouth daily as needed (for constipation).    Yes Historical Provider, MD  potassium chloride 20 MEQ/15ML (10%) SOLN Take 20 mEq by mouth daily.   Yes Historical Provider, MD  protein supplement (RESOURCE BENEPROTEIN) POWD Take 1 scoop by mouth daily.   Yes Historical Provider, MD  psyllium (METAMUCIL) 58.6 % powder Take 1 packet by mouth every other day.   Yes Historical Provider, MD  senna-docusate (SENEXON-S) 8.6-50 MG tablet Take 1 tablet by mouth every other day as needed for moderate constipation. 09/10/15  Yes Tiffany L Reed, DO  sertraline (ZOLOFT) 25 MG tablet Take 1 tablet (25 mg total) by mouth daily. 04/01/15  Yes Tiffany L Reed, DO  trolamine salicylate (MYOFLEX) 10 % cream Apply 1 application topically every 4 (four) hours as needed for muscle pain.   Yes Historical Provider, MD   BP 136/72 mmHg  Pulse 79  Temp(Src) 98.5 F (36.9 C) (Oral)  Resp 16  Ht 5\' 3"  (1.6 m)  Wt 83 lb (37.649 kg)  BMI 14.71 kg/m2  SpO2 97% Physical Exam  Constitutional: She is oriented to person, place, and time. She appears well-developed and well-nourished.  No distress.  HENT:  Head: Normocephalic and atraumatic.  Eyes: EOM are normal. Pupils are equal, round, and reactive to light.  Neck: Normal range of motion. Neck supple.  Cardiovascular: Normal rate and regular rhythm.  Exam reveals no gallop and no friction rub.   No murmur heard. Pulmonary/Chest: Effort normal. She has no wheezes. She has no rales.  Abdominal: Soft. She exhibits no distension. There is no tenderness.  Musculoskeletal: She exhibits tenderness. She exhibits no edema.  Tender palpation of the posterior aspect of the right shoulder. Pulse motor and sensation intact. Patient has limitation to forward flexion as well as lateral flexion. Difficult to evaluate the sits muscles due to pain.  Neurological: She is alert and oriented to person, place, and time.  Skin: Skin is warm and dry. She is not diaphoretic.  Psychiatric: She has a normal mood and affect. Her behavior is normal.  Nursing note and vitals reviewed.   ED Course  Procedures (including critical care time) Labs Review Labs Reviewed - No data to display  Imaging Review Dg Shoulder Right  09/25/2015  CLINICAL DATA:  82 year old female with new onset right-sided shoulder pain. No injury or fall. EXAM: RIGHT SHOULDER - 2+ VIEW COMPARISON:  Prior chest x-ray 08/09/2015 FINDINGS: No evidence of acute fracture. High-riding humeral head with severe reduction in the subacromial space. Somewhat irregular mottled appearance of the humeral head and neck without distinct lesion. The visualized thorax is unremarkable. IMPRESSION: 1. Suspect chronic right-sided rotator cuff tear resulting in high-riding humeral head and significant reduction in the normal subacromial space. This could lead to clinical signs of shoulder impingement. 2. No acute fracture or malalignment. 3. Irregular mottled sclerosis in the humeral head favored to represent either patchy osteopenia or prior bone infarct. No definite discrete lesion. Electronically  Signed   By: Jacqulynn Cadet M.D.   On: 09/25/2015 12:17   I have personally reviewed and evaluated these images and lab results as part of my medical decision-making.   EKG Interpretation None      MDM   Final diagnoses:  Right shoulder pain    82 yo F with a chief complaint of right shoulder pain. X-ray was concerning for Rotator cuff injury. Patient was placed in a sling. We'll have her follow-up with orthopedics.  3:23 PM:  I have discussed the diagnosis/risks/treatment options with the patient and believe the pt to be eligible for discharge home to follow-up with Ortho. We also discussed returning to the ED immediately if new or worsening sx occur. We discussed the sx which are most concerning (e.g., sudden worsening pain, fever) that necessitate immediate return. Medications administered to the patient during their visit and any new prescriptions provided to the patient are listed below.  Medications given during this visit Medications  acetaminophen (TYLENOL) tablet 1,000 mg (1,000 mg Oral Given 09/25/15 1152)    Discharge Medication List as of 09/25/2015 12:42 PM      The patient appears reasonably screen and/or stabilized for discharge and I doubt any other medical condition or other [facility] requiring further screening, evaluation, or treatment in the ED at this time prior to discharge.      Deno Etienne, DO 09/25/15 1523

## 2015-09-25 NOTE — ED Notes (Addendum)
Per EMS, patient woke up this morning with swelling to right shoulder. Painful to palpation. Denies injury/trauma to this area. [place] [facility]

## 2015-09-25 NOTE — ED Notes (Signed)
[place] instructions and follow up care reviewed with patient and family member. Patient and family member verbalized understanding. Patient left with son. [facility] called canceled.

## 2015-09-25 NOTE — ED Notes (Signed)
Patient transported to X-ray 

## 2015-09-25 NOTE — ED Notes (Signed)
MD at bedside. 

## 2015-09-25 NOTE — ED Notes (Signed)
[facility] notified of transport. 

## 2015-09-25 NOTE — ED Notes (Signed)
Spoke with staff member regarding patient's care at [facility].

## 2015-09-28 DIAGNOSIS — M25511 Pain in right shoulder: Secondary | ICD-10-CM | POA: Diagnosis not present

## 2015-09-30 DIAGNOSIS — M75101 Unspecified rotator cuff tear or rupture of right shoulder, not specified as traumatic: Secondary | ICD-10-CM | POA: Diagnosis not present

## 2015-09-30 DIAGNOSIS — J439 Emphysema, unspecified: Secondary | ICD-10-CM | POA: Diagnosis not present

## 2015-10-07 DIAGNOSIS — M75101 Unspecified rotator cuff tear or rupture of right shoulder, not specified as traumatic: Secondary | ICD-10-CM | POA: Diagnosis not present

## 2015-10-07 DIAGNOSIS — J439 Emphysema, unspecified: Secondary | ICD-10-CM | POA: Diagnosis not present

## 2015-10-08 DIAGNOSIS — M75101 Unspecified rotator cuff tear or rupture of right shoulder, not specified as traumatic: Secondary | ICD-10-CM | POA: Diagnosis not present

## 2015-10-08 DIAGNOSIS — J439 Emphysema, unspecified: Secondary | ICD-10-CM | POA: Diagnosis not present

## 2015-10-12 DIAGNOSIS — J439 Emphysema, unspecified: Secondary | ICD-10-CM | POA: Diagnosis not present

## 2015-10-12 DIAGNOSIS — M75101 Unspecified rotator cuff tear or rupture of right shoulder, not specified as traumatic: Secondary | ICD-10-CM | POA: Diagnosis not present

## 2015-10-14 DIAGNOSIS — M75101 Unspecified rotator cuff tear or rupture of right shoulder, not specified as traumatic: Secondary | ICD-10-CM | POA: Diagnosis not present

## 2015-10-14 DIAGNOSIS — J439 Emphysema, unspecified: Secondary | ICD-10-CM | POA: Diagnosis not present

## 2015-10-18 DIAGNOSIS — M75101 Unspecified rotator cuff tear or rupture of right shoulder, not specified as traumatic: Secondary | ICD-10-CM | POA: Diagnosis not present

## 2015-10-18 DIAGNOSIS — J439 Emphysema, unspecified: Secondary | ICD-10-CM | POA: Diagnosis not present

## 2015-10-21 DIAGNOSIS — M75101 Unspecified rotator cuff tear or rupture of right shoulder, not specified as traumatic: Secondary | ICD-10-CM | POA: Diagnosis not present

## 2015-10-21 DIAGNOSIS — J439 Emphysema, unspecified: Secondary | ICD-10-CM | POA: Diagnosis not present

## 2015-10-26 DIAGNOSIS — M75101 Unspecified rotator cuff tear or rupture of right shoulder, not specified as traumatic: Secondary | ICD-10-CM | POA: Diagnosis not present

## 2015-10-26 DIAGNOSIS — J439 Emphysema, unspecified: Secondary | ICD-10-CM | POA: Diagnosis not present

## 2015-10-27 DIAGNOSIS — R634 Abnormal weight loss: Secondary | ICD-10-CM | POA: Diagnosis not present

## 2015-10-30 DIAGNOSIS — J439 Emphysema, unspecified: Secondary | ICD-10-CM | POA: Diagnosis not present

## 2015-10-30 DIAGNOSIS — M75101 Unspecified rotator cuff tear or rupture of right shoulder, not specified as traumatic: Secondary | ICD-10-CM | POA: Diagnosis not present

## 2015-11-02 DIAGNOSIS — M75101 Unspecified rotator cuff tear or rupture of right shoulder, not specified as traumatic: Secondary | ICD-10-CM | POA: Diagnosis not present

## 2015-11-02 DIAGNOSIS — J439 Emphysema, unspecified: Secondary | ICD-10-CM | POA: Diagnosis not present

## 2015-11-08 DIAGNOSIS — M75101 Unspecified rotator cuff tear or rupture of right shoulder, not specified as traumatic: Secondary | ICD-10-CM | POA: Diagnosis not present

## 2015-11-08 DIAGNOSIS — J439 Emphysema, unspecified: Secondary | ICD-10-CM | POA: Diagnosis not present

## 2015-11-12 DIAGNOSIS — J439 Emphysema, unspecified: Secondary | ICD-10-CM | POA: Diagnosis not present

## 2015-11-12 DIAGNOSIS — M75101 Unspecified rotator cuff tear or rupture of right shoulder, not specified as traumatic: Secondary | ICD-10-CM | POA: Diagnosis not present

## 2015-11-18 DIAGNOSIS — M75101 Unspecified rotator cuff tear or rupture of right shoulder, not specified as traumatic: Secondary | ICD-10-CM | POA: Diagnosis not present

## 2015-11-18 DIAGNOSIS — J439 Emphysema, unspecified: Secondary | ICD-10-CM | POA: Diagnosis not present

## 2015-11-19 DIAGNOSIS — M75101 Unspecified rotator cuff tear or rupture of right shoulder, not specified as traumatic: Secondary | ICD-10-CM | POA: Diagnosis not present

## 2015-11-19 DIAGNOSIS — J439 Emphysema, unspecified: Secondary | ICD-10-CM | POA: Diagnosis not present

## 2015-11-22 DIAGNOSIS — M75101 Unspecified rotator cuff tear or rupture of right shoulder, not specified as traumatic: Secondary | ICD-10-CM | POA: Diagnosis not present

## 2015-11-22 DIAGNOSIS — J439 Emphysema, unspecified: Secondary | ICD-10-CM | POA: Diagnosis not present

## 2015-11-25 DIAGNOSIS — J439 Emphysema, unspecified: Secondary | ICD-10-CM | POA: Diagnosis not present

## 2015-11-25 DIAGNOSIS — M75101 Unspecified rotator cuff tear or rupture of right shoulder, not specified as traumatic: Secondary | ICD-10-CM | POA: Diagnosis not present

## 2015-11-29 DIAGNOSIS — J439 Emphysema, unspecified: Secondary | ICD-10-CM | POA: Diagnosis not present

## 2015-11-29 DIAGNOSIS — M75101 Unspecified rotator cuff tear or rupture of right shoulder, not specified as traumatic: Secondary | ICD-10-CM | POA: Diagnosis not present

## 2015-12-01 DIAGNOSIS — M75101 Unspecified rotator cuff tear or rupture of right shoulder, not specified as traumatic: Secondary | ICD-10-CM | POA: Diagnosis not present

## 2015-12-01 DIAGNOSIS — J439 Emphysema, unspecified: Secondary | ICD-10-CM | POA: Diagnosis not present

## 2015-12-02 DIAGNOSIS — J439 Emphysema, unspecified: Secondary | ICD-10-CM | POA: Diagnosis not present

## 2015-12-02 DIAGNOSIS — M75101 Unspecified rotator cuff tear or rupture of right shoulder, not specified as traumatic: Secondary | ICD-10-CM | POA: Diagnosis not present

## 2015-12-06 ENCOUNTER — Ambulatory Visit (INDEPENDENT_AMBULATORY_CARE_PROVIDER_SITE_OTHER): Payer: Medicare Other | Admitting: Nurse Practitioner

## 2015-12-06 ENCOUNTER — Encounter: Payer: Self-pay | Admitting: Nurse Practitioner

## 2015-12-06 VITALS — BP 112/68 | HR 95 | Temp 97.5°F | Resp 17 | Ht 63.0 in | Wt 83.8 lb

## 2015-12-06 DIAGNOSIS — R238 Other skin changes: Secondary | ICD-10-CM

## 2015-12-06 DIAGNOSIS — L989 Disorder of the skin and subcutaneous tissue, unspecified: Secondary | ICD-10-CM | POA: Diagnosis not present

## 2015-12-06 NOTE — Progress Notes (Signed)
Patient ID: Bianca Khan, female   DOB: 1915-03-15, 82 y.o.   MRN: [id number]    PCP: Hollace Kinnier, DO  Advanced Directive information Does patient have an advance directive?: Yes, Type of Advance Directive: [place];Out of facility DNR (pink MOST or yellow form)  No Known Allergies  Chief Complaint  Patient presents with  . Acute Visit    Mole on back- non painful,   . OTHER    Packet sent from [place] at [facility] that needs to be completed by provider.     HPI: Patient is a 82 y.o. female seen in the office today due to open area on back that she noticed 2 days ago. Pt with hx of COPD, weight loss, CHF, HTN. Pt reports she noticed mole on back 2 days that was open and bleeding. Does not really bother her except stings a little at times. Slight redness around area. No fever or drainage.   Review of Systems:  Review of Systems  Skin: Positive for color change.       Mole to back now open with redness surrounding area and previously bleeding    Past Medical History  Diagnosis Date  . COPD (chronic obstructive pulmonary disease) ([place])   . CHF (congestive heart failure) ([place])   . Edema   . Myocardial infarction ([place])   . Anxiety   . Hypertension   . Anemia   . Leukocytosis   . Acid reflux   . Osteoporosis   . Sinus drainage   . Weakness   . Unspecified intestinal obstruction ([place])   . Unspecified constipation   . Type II or unspecified type diabetes mellitus without mention of complication, not stated as uncontrolled   . Solitary pulmonary nodule   . Other abnormal blood chemistry   . Acute bronchitis   . Adult failure to thrive   . Loss of weight   . Unspecified fall   . Insomnia, unspecified   . Insomnia, unspecified   . Open wound of scalp, without mention of complication   . Atrial fibrillation ([place])   . Edema   . Carbuncle and furuncle of other specified sites   . Personal history of methicillin resistant Staphylococcus aureus   .  Impacted cerumen   . Chronic airway obstruction, not elsewhere classified   . Coronary atherosclerosis of native coronary artery   . Leukocytosis, unspecified   . Anxiety state, unspecified   . Depressive disorder, not elsewhere classified   . Unspecified essential hypertension   . Cardiomegaly   . Unspecified pleural effusion   . Unspecified pleural effusion   . Esophagitis, unspecified   . Reflux esophagitis   . Duodenal ulcer, unspecified as acute or chronic, without hemorrhage, perforation, or obstruction   . Diaphragmatic hernia without mention of obstruction or gangrene   . Senile osteoporosis   . Abnormal involuntary movements(781.0)   . Abnormality of gait   . Loss of weight   . Other dyspnea and respiratory abnormality    Past Surgical History  Procedure Laterality Date  . Laparotomy  04/04/2012    Procedure: EXPLORATORY LAPAROTOMY;  Surgeon: Adin Hector, MD;  Location: [place];  Service: General;  Laterality: N/A;  . Abdominal hysterectomy  1991  . Hip surgery    . Fracture surgery  2011    hip   Social History:   reports that she has never smoked. She has never used smokeless tobacco. She reports that she drinks alcohol. She reports  that she does not use illicit drugs.  Family History  Problem Relation Age of Onset  . Stroke Father     Medications: Patient's Medications  New Prescriptions   No medications on file  Previous Medications   ACETAMINOPHEN (TYLENOL) 325 MG TABLET    Take 650 mg by mouth every 4 (four) hours as needed (for pain).    ALBUTEROL (PROVENTIL HFA;VENTOLIN HFA) 108 (90 BASE) MCG/ACT INHALER    Inhale 2 puffs into the lungs every 6 (six) hours as needed for wheezing or shortness of breath.   ASPIRIN 81 MG CHEWABLE TABLET    Chew 81 mg by mouth daily.   BENZOCAINE (ORAJEL) 10 % MUCOSAL GEL    Use as directed 1 application in the mouth or throat as needed for mouth pain.   CAMPHOR-MENTHOL-METHYL SAL 1.2-5.7-6.3 % PTCH    Apply 1 patch  topically daily.   CHOLECALCIFEROL (VITAMIN D3) 2000 UNITS CAPSULE    Take 2,000 Units by mouth daily.   DILTIAZEM (CARDIZEM CD) 240 MG 24 HR CAPSULE    Take one capsule by mouth once daily   DOCUSATE SODIUM (COLACE) 100 MG CAPSULE    Take 100 mg by mouth daily as needed for mild constipation.    GUAIFENESIN (MUCINEX) 600 MG 12 HR TABLET    Take 1 tablet (600 mg total) by mouth daily after breakfast.   HYDROCHLOROTHIAZIDE (HYDRODIURIL) 25 MG TABLET    TAKE 1 TABLET DAILY FOR EDEMA   HYDROCORTISONE (ANUSOL-HC) 25 MG SUPPOSITORY    Place 25 mg rectally daily as needed (for itching caused my hemorrhoids).    LEVALBUTEROL (XOPENEX) 0.63 MG/3ML NEBULIZER SOLUTION    Take 3 mLs (0.63 mg total) by nebulization every 6 (six) hours as needed for wheezing or shortness of breath.   MAGIC MOUTHWASH W/LIDOCAINE SOLN    Take 5 mLs by mouth 4 (four) times daily as needed for mouth pain. After neb treatments   MIRTAZAPINE (REMERON) 15 MG TABLET    TAKE 1 TABLET AT BEDTIME   NUTRITIONAL SUPPLEMENTS (NUTRITIONAL SHAKE) LIQD    Take 1 Can by mouth 3 (three) times daily between meals. *[employer] Strawberry Shake*   OMEPRAZOLE (PRILOSEC) 20 MG CAPSULE    TAKE 1 CAPSULE DAILY TO REDUCE STOMACH ACID AND TO HELP PROTECT ESOPHAGUS   POLYETHYLENE GLYCOL (MIRALAX / GLYCOLAX) PACKET    Take 17 g by mouth daily as needed (for constipation).    POTASSIUM CHLORIDE 20 MEQ/15ML (10%) SOLN    Take 20 mEq by mouth daily.   PROTEIN SUPPLEMENT (RESOURCE BENEPROTEIN) POWD    Take 1 scoop by mouth daily.   PSYLLIUM (METAMUCIL) 58.6 % POWDER    Take 1 packet by mouth every other day.   SENNA-DOCUSATE (SENEXON-S) 8.6-50 MG TABLET    Take 1 tablet by mouth every other day as needed for moderate constipation.   SERTRALINE (ZOLOFT) 25 MG TABLET    Take 1 tablet (25 mg total) by mouth daily.   TROLAMINE SALICYLATE (MYOFLEX) 10 % CREAM    Apply 1 application topically every 4 (four) hours as needed for muscle pain.  Modified Medications     No medications on file  Discontinued Medications   No medications on file     Physical Exam:  Filed Vitals:   12/06/15 1005  BP: 112/68  Pulse: 95  Temp: 97.5 F (36.4 C)  TempSrc: Oral  Resp: 17  Height: 5\' 3"  (1.6 m)  Weight: 83 lb 12.8 oz (38.011 kg)  SpO2: 93%   Body mass index is 14.85 kg/(m^2).  Physical Exam  Constitutional:  Frail white female walks with walker  Cardiovascular: Normal rate, regular rhythm and normal heart sounds.   Pulmonary/Chest: Effort normal. She has wheezes (faint throughout).  Neurological: She is alert.  Short term memory loss  Skin:     Psychiatric: She has a normal mood and affect.    Labs reviewed: Basic Metabolic Panel:  Recent Labs  08/09/15 1516  NA 141  K 3.2*  CL 101  CO2 27  GLUCOSE 138*  BUN 29*  CREATININE 1.15  CALCIUM 9.8  TSH 2.82   Liver Function Tests: No results for input(s): AST, ALT, ALKPHOS, BILITOT, PROT, ALBUMIN in the last 8760 hours. No results for input(s): LIPASE, AMYLASE in the last 8760 hours. No results for input(s): AMMONIA in the last 8760 hours. CBC:  Recent Labs  08/09/15 1516  WBC 17.2*  NEUTROABS 11.0*  HGB 13.3  HCT 40.5  MCV 87.3  PLT 515.0*   Lipid Panel: No results for input(s): CHOL, HDL, LDLCALC, TRIG, CHOLHDL, LDLDIRECT in the last 8760 hours. TSH:  Recent Labs  08/09/15 1516  TSH 2.82   A1C: Lab Results  Component Value Date   HGBA1C 6.6* 08/20/2014     Assessment/Plan 1. Skin irritation Noted from bra and under shirt to mole. Will have staff at facility apply neosporin and keep area cover. To apply twice daily Pt with follow up appt next week to see Dr Mariea Clonts. To keep this appt to re-evaluate To notify if area becomes worse, increase redness, tenderness or drainage or fever occurs.     Carlos American. Harle Battiest  [facility] [phone] 8 am - 5 pm) [phone] (after hours)

## 2015-12-06 NOTE — Patient Instructions (Signed)
To apply neosporin to irritated skin on left upper back twice daily, keep area covered to avoid further irritation by bra strap and clothing  Notify if area becomes worse, increase in redness, heat or drainage  Follow up in 1 week for recheck

## 2015-12-07 DIAGNOSIS — M75101 Unspecified rotator cuff tear or rupture of right shoulder, not specified as traumatic: Secondary | ICD-10-CM | POA: Diagnosis not present

## 2015-12-07 DIAGNOSIS — J439 Emphysema, unspecified: Secondary | ICD-10-CM | POA: Diagnosis not present

## 2015-12-09 DIAGNOSIS — J439 Emphysema, unspecified: Secondary | ICD-10-CM | POA: Diagnosis not present

## 2015-12-09 DIAGNOSIS — M75101 Unspecified rotator cuff tear or rupture of right shoulder, not specified as traumatic: Secondary | ICD-10-CM | POA: Diagnosis not present

## 2015-12-15 DIAGNOSIS — J439 Emphysema, unspecified: Secondary | ICD-10-CM | POA: Diagnosis not present

## 2015-12-15 DIAGNOSIS — M75101 Unspecified rotator cuff tear or rupture of right shoulder, not specified as traumatic: Secondary | ICD-10-CM | POA: Diagnosis not present

## 2015-12-16 ENCOUNTER — Encounter: Payer: Self-pay | Admitting: Internal Medicine

## 2015-12-16 ENCOUNTER — Ambulatory Visit (INDEPENDENT_AMBULATORY_CARE_PROVIDER_SITE_OTHER): Payer: Medicare Other | Admitting: Internal Medicine

## 2015-12-16 VITALS — BP 128/60 | HR 70 | Temp 98.2°F | Ht 63.0 in | Wt 80.0 lb

## 2015-12-16 DIAGNOSIS — J438 Other emphysema: Secondary | ICD-10-CM | POA: Diagnosis not present

## 2015-12-16 DIAGNOSIS — M25511 Pain in right shoulder: Secondary | ICD-10-CM | POA: Diagnosis not present

## 2015-12-16 DIAGNOSIS — I5032 Chronic diastolic (congestive) heart failure: Secondary | ICD-10-CM

## 2015-12-16 DIAGNOSIS — E1142 Type 2 diabetes mellitus with diabetic polyneuropathy: Secondary | ICD-10-CM | POA: Diagnosis not present

## 2015-12-16 DIAGNOSIS — G301 Alzheimer's disease with late onset: Secondary | ICD-10-CM | POA: Diagnosis not present

## 2015-12-16 DIAGNOSIS — L989 Disorder of the skin and subcutaneous tissue, unspecified: Secondary | ICD-10-CM

## 2015-12-16 DIAGNOSIS — K5901 Slow transit constipation: Secondary | ICD-10-CM

## 2015-12-16 DIAGNOSIS — F0280 Dementia in other diseases classified elsewhere without behavioral disturbance: Secondary | ICD-10-CM

## 2015-12-16 DIAGNOSIS — R238 Other skin changes: Secondary | ICD-10-CM

## 2015-12-16 NOTE — Progress Notes (Signed)
Location:  [facility] clinic Provider:  Brighton Pilley L. Mariea Clonts, D.O., C.M.D.  Code Status: DNR Goals of Care:  Advanced Directives 12/16/2015  Does patient have an advance directive? Yes  Type of Advance Directive Healthcare Power of Attorney  Copy of advanced directive(s) in chart? Yes   Chief Complaint  Patient presents with  . Medical Management of Chronic Issues    3 mth follow-up    HPI: Patient is a 82 y.o. female seen today for medical management of chronic diseases.    F/u on left upper back mole that she picked open.  Not painful now.  Using neosporin on it.  Itchy a little.    No new things.  Weight continues to trend down.  Drinks supplements says 1 or 2 per day.  Is offered three times per day the strawberry shake.  She was refusing the magic cups.  Does not like the evening meals.  Eats a good breakfast around 10--says she cleans her plate.  Says she does not really eat lunch or dinner.  Does not like what there is.  Has not really eaten well when taken out for lunch or dinner with her daughter in law or her son, Marlou Sa.    No pain.  A little short of breath.  If she relaxes, she feels better.  Claims her mouth still bothers her from the COPD medications.    Bowels are moving ok.  Does not complain of flatus.  Going daily.  Sleeping ok.    Past Medical History  Diagnosis Date  . COPD (chronic obstructive pulmonary disease) ([place])   . CHF (congestive heart failure) ([place])   . Edema   . Myocardial infarction ([place])   . Anxiety   . Hypertension   . Anemia   . Leukocytosis   . Acid reflux   . Osteoporosis   . Sinus drainage   . Weakness   . Unspecified intestinal obstruction ([place])   . Unspecified constipation   . Type II or unspecified type diabetes mellitus without mention of complication, not stated as uncontrolled   . Solitary pulmonary nodule   . Other abnormal blood chemistry   . Acute bronchitis   . Adult failure to thrive   . Loss of weight   . Unspecified fall     . Insomnia, unspecified   . Insomnia, unspecified   . Open wound of scalp, without mention of complication   . Atrial fibrillation ([place])   . Edema   . Carbuncle and furuncle of other specified sites   . Personal history of methicillin resistant Staphylococcus aureus   . Impacted cerumen   . Chronic airway obstruction, not elsewhere classified   . Coronary atherosclerosis of native coronary artery   . Leukocytosis, unspecified   . Anxiety state, unspecified   . Depressive disorder, not elsewhere classified   . Unspecified essential hypertension   . Cardiomegaly   . Unspecified pleural effusion   . Unspecified pleural effusion   . Esophagitis, unspecified   . Reflux esophagitis   . Duodenal ulcer, unspecified as acute or chronic, without hemorrhage, perforation, or obstruction   . Diaphragmatic hernia without mention of obstruction or gangrene   . Senile osteoporosis   . Abnormal involuntary movements(781.0)   . Abnormality of gait   . Loss of weight   . Other dyspnea and respiratory abnormality     Past Surgical History  Procedure Laterality Date  . Laparotomy  04/04/2012    Procedure: EXPLORATORY LAPAROTOMY;  Surgeon: Renelda Loma  Alyssa Grove, MD;  Location: [facility];  Service: General;  Laterality: N/A;  . Abdominal hysterectomy  1991  . Hip surgery    . Fracture surgery  2011    hip    No Known Allergies    Medication List       This list is accurate as of: 12/16/15  1:10 PM.  Always use your most recent med list.               acetaminophen 325 MG tablet  Commonly known as:  TYLENOL  Take 650 mg by mouth every 4 (four) hours as needed (for pain).     albuterol 108 (90 Base) MCG/ACT inhaler  Commonly known as:  PROVENTIL HFA;VENTOLIN HFA  Inhale 2 puffs into the lungs every 6 (six) hours as needed for wheezing or shortness of breath.     aspirin 81 MG chewable tablet  Chew 81 mg by mouth daily.     benzocaine 10 % mucosal gel  Commonly known as:  ORAJEL  Use as  directed 1 application in the mouth or throat as needed for mouth pain.     Camphor-Menthol-Methyl Sal 1.2-5.7-6.3 % Ptch  Apply 1 patch topically daily.     diltiazem 240 MG 24 hr capsule  Commonly known as:  CARDIZEM CD  Take 240 mg by mouth daily.     docusate sodium 100 MG capsule  Commonly known as:  COLACE  Take 100 mg by mouth daily as needed for mild constipation.     guaiFENesin 600 MG 12 hr tablet  Commonly known as:  MUCINEX  Take 1 tablet (600 mg total) by mouth daily after breakfast.     hydrochlorothiazide 25 MG tablet  Commonly known as:  HYDRODIURIL  TAKE 1 TABLET DAILY FOR EDEMA     hydrocortisone 25 MG suppository  Commonly known as:  ANUSOL-HC  Place 25 mg rectally daily as needed (for itching caused my hemorrhoids).     levalbuterol 0.63 MG/3ML nebulizer solution  Commonly known as:  XOPENEX  Take 3 mLs (0.63 mg total) by nebulization every 6 (six) hours as needed for wheezing or shortness of breath.     magic mouthwash w/lidocaine Soln  Take 5 mLs by mouth 4 (four) times daily as needed for mouth pain. After neb treatments     mirtazapine 15 MG tablet  Commonly known as:  REMERON  TAKE 1 TABLET AT BEDTIME     MYOFLEX 10 % cream  Generic drug:  trolamine salicylate  Apply 1 application topically every 4 (four) hours as needed for muscle pain.     NUTRITIONAL SHAKE Liqd  Take 1 Can by mouth 3 (three) times daily between meals. *[employer] Strawberry Shake*     omeprazole 20 MG capsule  Commonly known as:  PRILOSEC  TAKE 1 CAPSULE DAILY TO REDUCE STOMACH ACID AND TO HELP PROTECT ESOPHAGUS     polyethylene glycol packet  Commonly known as:  MIRALAX / GLYCOLAX  Take 17 g by mouth daily as needed (for constipation).     potassium chloride 20 MEQ/15ML (10%) Soln  Take 20 mEq by mouth daily.     protein supplement Powd  Take 1 scoop by mouth daily.     psyllium 58.6 % powder  Commonly known as:  METAMUCIL  Take 1 packet by mouth every other  day.     senna-docusate 8.6-50 MG tablet  Commonly known as:  SENEXON-S  Take 1 tablet by mouth every other day  as needed for moderate constipation.     sertraline 25 MG tablet  Commonly known as:  ZOLOFT  Take 1 tablet (25 mg total) by mouth daily.     Vitamin D3 2000 units capsule  Take 2,000 Units by mouth daily.        Review of Systems:  ROS  Health Maintenance  Topic Date Due  . OPHTHALMOLOGY EXAM  12[birth year]31/1926  . URINE MICROALBUMIN  12/31/1926  . TETANUS/TDAP  12/31/1935  . MAMMOGRAM  08/17/2012  . HEMOGLOBIN A1C  02/18/2015  . FOOT EXAM  05/22/2015  . INFLUENZA VACCINE  02/01/2016  . DEXA SCAN  Completed  . ZOSTAVAX  Completed  . PNA vac Low Risk Adult  Completed    Physical Exam: Filed Vitals:   12/16/15 1301  BP: 128/60  Pulse: 70  Temp: 98.2 F (36.8 C)  TempSrc: Oral  Height: 5\' 3"  (1.6 m)  Weight: 80 lb (36.288 kg)  SpO2: 94%   Body mass index is 14.18 kg/(m^2). Physical Exam  Labs reviewed: Basic Metabolic Panel:  Recent Labs  08/09/15 1516  NA 141  K 3.2*  CL 101  CO2 27  GLUCOSE 138*  BUN 29*  CREATININE 1.15  CALCIUM 9.8  TSH 2.82   Liver Function Tests: No results for input(s): AST, ALT, ALKPHOS, BILITOT, PROT, ALBUMIN in the last 8760 hours. No results for input(s): LIPASE, AMYLASE in the last 8760 hours. No results for input(s): AMMONIA in the last 8760 hours. CBC:  Recent Labs  08/09/15 1516  WBC 17.2*  NEUTROABS 11.0*  HGB 13.3  HCT 40.5  MCV 87.3  PLT 515.0*   Lipid Panel: No results for input(s): CHOL, HDL, LDLCALC, TRIG, CHOLHDL, LDLDIRECT in the last 8760 hours. Lab Results  Component Value Date   HGBA1C 6.6* 08/20/2014    Procedures since last visit: No results found.  Assessment/Plan 1. Skin irritation -cont antibiotic ointment and bandaid over excoriated keratosis on her back  2. Other emphysema ([place]) - is a big piece of her shortness of breath, but won't use any of the inhalers as directed due  to supposed sores she had in her mouth that I could never see  3. Late onset Alzheimer's disease without behavioral disturbance -cont AL level of care, is pretty good functionally, but short term memory is bad  4. Type 2 diabetes mellitus with diabetic polyneuropathy, without long-term current use of insulin (HCC) -well controlled for years, she is 27 so I don't put her through labs every 4 mos when she weighs less than her age -not on meds   5. Chronic diastolic congestive heart failure, NYHA class 1 (HCC) -also contributes to dyspnea -on hctz  6. Slow transit constipation -cont fiber supplement and stool softener plus miralax if needed  7. Right shoulder pain - with some tendonitis, cont tylenol, heat if painful  Labs/tests ordered:  No orders of the defined types were placed in this encounter.   Next appt:  03/23/2016 med mgt  Janett Kamath L. Dominyk Law, D.O. [facility] [address], [place] [address] Cell Phone (Mon-Fri 8am-5pm):  [phone] On Call:  [phone] & follow prompts after 5pm & weekends Office Phone:  [phone] Office Fax:  [phone]

## 2015-12-16 NOTE — Patient Instructions (Addendum)
Continue with neosporin and a bandaid on the left shoulder area open area  Continue to do exercises from [place] for right shoulder Continue to rest if your back hurts.

## 2015-12-23 DIAGNOSIS — J439 Emphysema, unspecified: Secondary | ICD-10-CM | POA: Diagnosis not present

## 2015-12-23 DIAGNOSIS — M75101 Unspecified rotator cuff tear or rupture of right shoulder, not specified as traumatic: Secondary | ICD-10-CM | POA: Diagnosis not present

## 2016-02-21 ENCOUNTER — Other Ambulatory Visit: Payer: Self-pay | Admitting: *Deleted

## 2016-02-21 MED ORDER — BENEPROTEIN PO POWD: 1.0000 | Freq: Every day | ORAL | 5 refills | Status: DC

## 2016-02-21 NOTE — Telephone Encounter (Signed)
[facility]

## 2016-02-22 DIAGNOSIS — R634 Abnormal weight loss: Secondary | ICD-10-CM | POA: Diagnosis not present

## 2016-03-23 ENCOUNTER — Encounter: Payer: Self-pay | Admitting: Internal Medicine

## 2016-03-23 ENCOUNTER — Ambulatory Visit (INDEPENDENT_AMBULATORY_CARE_PROVIDER_SITE_OTHER): Payer: Medicare Other | Admitting: Internal Medicine

## 2016-03-23 VITALS — BP 112/68 | HR 59 | Temp 97.6°F | Resp 18 | Ht 63.0 in | Wt 81.8 lb

## 2016-03-23 DIAGNOSIS — E1142 Type 2 diabetes mellitus with diabetic polyneuropathy: Secondary | ICD-10-CM | POA: Diagnosis not present

## 2016-03-23 DIAGNOSIS — G301 Alzheimer's disease with late onset: Secondary | ICD-10-CM

## 2016-03-23 DIAGNOSIS — R195 Other fecal abnormalities: Secondary | ICD-10-CM

## 2016-03-23 DIAGNOSIS — F0280 Dementia in other diseases classified elsewhere without behavioral disturbance: Secondary | ICD-10-CM | POA: Diagnosis not present

## 2016-03-23 DIAGNOSIS — I5032 Chronic diastolic (congestive) heart failure: Secondary | ICD-10-CM

## 2016-03-23 DIAGNOSIS — I272 Other secondary pulmonary hypertension: Secondary | ICD-10-CM

## 2016-03-23 DIAGNOSIS — G250 Essential tremor: Secondary | ICD-10-CM | POA: Diagnosis not present

## 2016-03-23 DIAGNOSIS — Z23 Encounter for immunization: Secondary | ICD-10-CM

## 2016-03-23 NOTE — Progress Notes (Signed)
Location:  [facility] clinic Provider:  Kenleigh Toback L. Mariea Clonts, D.O., C.M.D.  Code Status: DNR Goals of Care:  Advanced Directives 03/23/2016  Does patient have an advance directive? Yes  Type of Paramedic of [place];Living will;Out of facility DNR (pink MOST or yellow form)  Does patient want to make changes to advanced directive? -  Copy of advanced directive(s) in chart? Yes  Pre-existing out of facility DNR order (yellow form or pink MOST form) -     Chief Complaint  Patient presents with  . Medical Management of Chronic Issues    3 month medication management. Pt wants flu vaccine today.   Marland Kitchen other    daughter-in-law, Colletta [place], in room. Paperwork from [place] in blue folder    HPI: Patient is a 82 y.o. female seen today for medical management of chronic diseases.    She has no complaints today.  Says last night, she was a little sob and did get her inhaler then.    Walking ok with her walker.  Only goes if someone is with her.    Milkshakes--gives her diarrhea and she ends up in the bathroom for 2-3 hrs.  She has gained 1.8 lbs with the shakes three times per day.    Had redness on bottom that resolved and baza cream was made prn now.  Past Medical History:  Diagnosis Date  . Abnormal involuntary movements(781.0)   . Abnormality of gait   . Acid reflux   . Acute bronchitis   . Adult failure to thrive   . Anemia   . Anxiety   . Anxiety state, unspecified   . Atrial fibrillation ([place])   . Carbuncle and furuncle of other specified sites   . Cardiomegaly   . CHF (congestive heart failure) ([place])   . Chronic airway obstruction, not elsewhere classified   . COPD (chronic obstructive pulmonary disease) ([place])   . Coronary atherosclerosis of native coronary artery   . Depressive disorder, not elsewhere classified   . Diaphragmatic hernia without mention of obstruction or gangrene   . Duodenal ulcer, unspecified as acute or chronic, without hemorrhage,  perforation, or obstruction   . Edema   . Edema   . Esophagitis, unspecified   . Hypertension   . Impacted cerumen   . Insomnia, unspecified   . Insomnia, unspecified   . Leukocytosis   . Leukocytosis, unspecified   . Loss of weight   . Loss of weight   . Myocardial infarction ([place])   . Open wound of scalp, without mention of complication   . Osteoporosis   . Other abnormal blood chemistry   . Other dyspnea and respiratory abnormality   . Personal history of methicillin resistant Staphylococcus aureus   . Reflux esophagitis   . Senile osteoporosis   . Sinus drainage   . Solitary pulmonary nodule   . Type II or unspecified type diabetes mellitus without mention of complication, not stated as uncontrolled   . Unspecified constipation   . Unspecified essential hypertension   . Unspecified fall   . Unspecified intestinal obstruction ([place])   . Unspecified pleural effusion   . Unspecified pleural effusion   . Weakness     Past Surgical History:  Procedure Laterality Date  . ABDOMINAL HYSTERECTOMY  1991  . FRACTURE SURGERY  2011   hip  . HIP SURGERY    . LAPAROTOMY  04/04/2012   Procedure: EXPLORATORY LAPAROTOMY;  Surgeon: Adin Hector, MD;  Location: [facility];  Service:  General;  Laterality: N/A;    No Known Allergies    Medication List       Accurate as of 03/23/16 11:12 AM. Always use your most recent med list.          acetaminophen 325 MG tablet Commonly known as:  TYLENOL Take 650 mg by mouth every 4 (four) hours as needed (for pain).   albuterol 108 (90 Base) MCG/ACT inhaler Commonly known as:  PROVENTIL HFA;VENTOLIN HFA Inhale 2 puffs into the lungs every 6 (six) hours as needed for wheezing or shortness of breath.   aspirin 81 MG chewable tablet Chew 81 mg by mouth daily.   diltiazem 240 MG 24 hr capsule Commonly known as:  CARDIZEM CD Take 240 mg by mouth daily.   docusate sodium 100 MG capsule Commonly known as:  COLACE Take 100 mg by mouth  daily as needed for mild constipation.   guaiFENesin 600 MG 12 hr tablet Commonly known as:  MUCINEX Take 600 mg by mouth daily as needed for to loosen phlegm.   hydrochlorothiazide 25 MG tablet Commonly known as:  HYDRODIURIL TAKE 1 TABLET DAILY FOR EDEMA   hydrocortisone 25 MG suppository Commonly known as:  ANUSOL-HC Place 25 mg rectally daily as needed (for itching caused my hemorrhoids).   levalbuterol 0.63 MG/3ML nebulizer solution Commonly known as:  XOPENEX Take 3 mLs (0.63 mg total) by nebulization every 6 (six) hours as needed for wheezing or shortness of breath.   mirtazapine 15 MG tablet Commonly known as:  REMERON TAKE 1 TABLET AT BEDTIME   NUTRITIONAL SHAKE Liqd Take 1 Can by mouth 3 (three) times daily between meals. *[employer] Strawberry Shake*   omeprazole 20 MG capsule Commonly known as:  PRILOSEC TAKE 1 CAPSULE DAILY TO REDUCE STOMACH ACID AND TO HELP PROTECT ESOPHAGUS   polyethylene glycol packet Commonly known as:  MIRALAX / GLYCOLAX Take 17 g by mouth daily as needed (for constipation).   potassium chloride 20 MEQ/15ML (10%) Soln Take 20 mEq by mouth daily.   protein supplement Powd Take 6 g by mouth daily.   psyllium 58.6 % powder Commonly known as:  METAMUCIL Take 1 packet by mouth every other day.   senna-docusate 8.6-50 MG tablet Commonly known as:  SENEXON-S Take 1 tablet by mouth every other day as needed for moderate constipation.   sertraline 25 MG tablet Commonly known as:  ZOLOFT Take 1 tablet (25 mg total) by mouth daily.   Vitamin D3 2000 units capsule Take 2,000 Units by mouth daily.       Review of Systems:  Review of Systems  Constitutional: Negative for chills, fever, malaise/fatigue and weight loss.  HENT: Negative for congestion and hearing loss.   Eyes: Negative for blurred vision.  Respiratory: Positive for wheezing. Negative for cough and shortness of breath.        Actually did get her inhaler once last  night  Cardiovascular: Positive for leg swelling. Negative for chest pain and palpitations.  Gastrointestinal: Positive for diarrhea. Negative for abdominal pain, blood in stool, constipation and melena.  Genitourinary: Negative for dysuria.  Musculoskeletal: Negative for falls.       Walks with walker, but only when accompanied now  Skin: Negative for itching and rash.  Neurological: Positive for weakness.    Health Maintenance  Topic Date Due  . OPHTHALMOLOGY EXAM  [birth year]31[birth year]1926  . URINE MICROALBUMIN  12/31/1926  . TETANUS/TDAP  12/31/1935  . MAMMOGRAM  08/17/2012  . HEMOGLOBIN A1C  02/18/2015  . FOOT EXAM  05/22/2015  . INFLUENZA VACCINE  02/01/2016  . DEXA SCAN  Completed  . ZOSTAVAX  Completed  . PNA vac Low Risk Adult  Completed    Physical Exam: Vitals:   03/23/16 1103  BP: 112/68  Pulse: (!) 59  Resp: 18  Temp: 97.6 F (36.4 C)  TempSrc: Oral  SpO2: 95%  Weight: 81 lb 12.8 oz (37.1 kg)  Height: 5\' 3"  (1.6 m)   Body mass index is 14.49 kg/m. Physical Exam  Constitutional:  Frail white female, walks with walker  HENT:  Head: Normocephalic and atraumatic.  Cardiovascular: Normal rate, regular rhythm, normal heart sounds and intact distal pulses.   Pulmonary/Chest: Effort normal and breath sounds normal. No respiratory distress. She has no wheezes.  Abdominal: Soft. Bowel sounds are normal.  Musculoskeletal: Normal range of motion.  Walks with walker  Neurological: She is alert.  Short term memory loss  Skin: Skin is warm and dry.  Psychiatric: She has a normal mood and affect.   Labs reviewed: Basic Metabolic Panel:  Recent Labs  08/09/15 1516  NA 141  K 3.2*  CL 101  CO2 27  GLUCOSE 138*  BUN 29*  CREATININE 1.15  CALCIUM 9.8  TSH 2.82   Liver Function Tests: No results for input(s): AST, ALT, ALKPHOS, BILITOT, PROT, ALBUMIN in the last 8760 hours. No results for input(s): LIPASE, AMYLASE in the last 8760 hours. No results for input(s):  AMMONIA in the last 8760 hours. CBC:  Recent Labs  08/09/15 1516  WBC 17.2*  NEUTROABS 11.0*  HGB 13.3  HCT 40.5  MCV 87.3  PLT 515.0*   Lipid Panel: No results for input(s): CHOL, HDL, LDLCALC, TRIG, CHOLHDL, LDLDIRECT in the last 8760 hours. Lab Results  Component Value Date   HGBA1C 6.6 (H) 08/20/2014    Assessment/Plan 1. Late onset Alzheimer's disease without behavioral disturbance -has been stable with her memory loss, not on meds as aricept might cause her to lose more weight  2. Type 2 diabetes mellitus with diabetic polyneuropathy, without long-term current use of insulin (HCC) - has not been in diabetic range lately, but hasn't been checked in over a year so will recheck hba1c -intake is low so I"m not too concerned about this - Hemoglobin A1c  3. Chronic diastolic congestive heart failure, NYHA class 1 (HCC) -stable also, is dyspneic on exertion, but also deconditioned  4. Essential tremor -stable, no tx needed  5. Pulmonary HTN - 41 mmHg -stable, gets dyspneic on exertion due to this, chf, and copd  6. Loose stools -pt says due to her strawberry shakes, but has been gaining weight (just 1.8 lbs) on these and that's one of her biggest problems -her daughter seemed shocked about the loose stools -will decrease metamucil to every third day and see how that goes--suspect that is more likely the problem b/c she never c/o this before and has been the shakes for a long time  7. Need for prophylactic vaccination and inoculation against influenza -flu shot given  Labs/tests ordered:   Orders Placed This Encounter  Procedures  . Hemoglobin A1c    Next appt:  06/19/2016   Desmen Schoffstall L. Naiomy Watters, D.O. [facility] [address], [place] [address] Cell Phone (Mon-Fri 8am-5pm):  [phone] On Call:  [phone] & follow prompts after 5pm & weekends Office Phone:  [phone] Office Fax:  [phone]

## 2016-03-24 LAB — HEMOGLOBIN A1C
Hgb A1c MFr Bld: 6.4 % — ABNORMAL HIGH (ref <5.7)
Mean Plasma Glucose: 137 mg/dL

## 2016-03-27 ENCOUNTER — Encounter: Payer: Self-pay | Admitting: *Deleted

## 2016-04-06 ENCOUNTER — Encounter: Payer: Self-pay | Admitting: Internal Medicine

## 2016-04-13 ENCOUNTER — Telehealth: Payer: Self-pay

## 2016-04-13 NOTE — Telephone Encounter (Signed)
Melissa Civil engineer, contracting of Nursing) called to follow-up on fax that was sent yesterday. Patient with swelling in feet (below the ankles) not going up the leg yet. +2 pitting worse on the left. Patient is keeping legs elevated. Patient keeps coming to the nursing asking what is Dr.Reed going to do about swelling.  Fax is in Urgent Folder on ledge, please review and advise.

## 2016-04-14 NOTE — Telephone Encounter (Signed)
Fax addressed yesterday

## 2016-04-17 DIAGNOSIS — R609 Edema, unspecified: Secondary | ICD-10-CM | POA: Diagnosis not present

## 2016-04-26 ENCOUNTER — Telehealth: Payer: Self-pay | Admitting: Internal Medicine

## 2016-04-26 NOTE — Telephone Encounter (Signed)
left msg asking pt to schedule AWV. VDM (Dee-Dee)

## 2016-05-29 ENCOUNTER — Encounter: Payer: Self-pay | Admitting: Nurse Practitioner

## 2016-05-29 ENCOUNTER — Ambulatory Visit (INDEPENDENT_AMBULATORY_CARE_PROVIDER_SITE_OTHER): Payer: Medicare Other | Admitting: Nurse Practitioner

## 2016-05-29 ENCOUNTER — Encounter: Payer: Self-pay | Admitting: Internal Medicine

## 2016-05-29 VITALS — BP 122/58 | HR 52 | Temp 97.8°F | Resp 18 | Ht 63.0 in | Wt 83.6 lb

## 2016-05-29 DIAGNOSIS — R195 Other fecal abnormalities: Secondary | ICD-10-CM | POA: Diagnosis not present

## 2016-05-29 DIAGNOSIS — R609 Edema, unspecified: Secondary | ICD-10-CM | POA: Diagnosis not present

## 2016-05-29 DIAGNOSIS — I5032 Chronic diastolic (congestive) heart failure: Secondary | ICD-10-CM

## 2016-05-29 DIAGNOSIS — I872 Venous insufficiency (chronic) (peripheral): Secondary | ICD-10-CM | POA: Diagnosis not present

## 2016-05-29 DIAGNOSIS — R600 Localized edema: Secondary | ICD-10-CM

## 2016-05-29 LAB — COMPLETE METABOLIC PANEL WITH GFR
ALT: 8 U/L (ref 6–29)
AST: 14 U/L (ref 10–35)
Albumin: 3.6 g/dL (ref 3.6–5.1)
Alkaline Phosphatase: 68 U/L (ref 33–130)
BILIRUBIN TOTAL: 0.3 mg/dL (ref 0.2–1.2)
BUN: 27 mg/dL — ABNORMAL HIGH (ref 7–25)
CHLORIDE: 103 mmol/L (ref 98–110)
CO2: 29 mmol/L (ref 20–31)
CREATININE: 1.03 mg/dL — AB (ref 0.60–0.88)
Calcium: 9.4 mg/dL (ref 8.6–10.4)
GFR, EST AFRICAN AMERICAN: 52 mL/min — AB (ref >=60)
GFR, Est Non African American: 45 mL/min — ABNORMAL LOW (ref >=60)
GLUCOSE: 110 mg/dL — AB (ref 65–99)
Potassium: 5 mmol/L (ref 3.5–5.3)
SODIUM: 140 mmol/L (ref 135–146)
TOTAL PROTEIN: 6.4 g/dL (ref 6.1–8.1)

## 2016-05-29 MED ORDER — PSYLLIUM 58.6 % PO POWD: 1.0000 | Freq: Every day | ORAL | Status: DC

## 2016-05-29 NOTE — Progress Notes (Signed)
  Careteam: Patient Care Team: Tiffany L Reed, DO as PCP - General (Geriatric Medicine)  No Known Allergies  Chief Complaint  Patient presents with  . Acute Visit    Increased edema in both legs x 7 days.      HPI: Patient is a 82 y.o. female seen in the office today due to swelling in her legs. Pt lives at [facility] and concerns over lower extremity edema. Pt with hx of dementia, CHF, venous insufficiency, and others. This is a chronic problem for pt. Swelling has progressively gotten worse since September. It was so bad this past weekend she could not get her shoe on and did not get to enjoy her thanksgiving. Worse over the last week. Down today and able to wear shoes.  No worsening dyspnea. No chest pains.  Ate bacon, eggs and grits for breakfast. Not on low sodium diet. Drinking tomato juice.  Does not wear compression hose.   Stool worse after decrease in metamucil. Loose a lot of the time   Review of Systems:  Review of Systems  Constitutional: Negative for activity change, appetite change (poor appetite), fatigue and unexpected weight change.       Frail elderly female  Eyes: Negative.   Respiratory: Negative for cough and shortness of breath.   Cardiovascular: Positive for leg swelling (improved at this time.). Negative for chest pain and palpitations.  Gastrointestinal: Negative for constipation and diarrhea.  Genitourinary: Negative for difficulty urinating and dysuria.  Skin: Negative for color change and wound.  Neurological: Negative for dizziness and weakness.  Psychiatric/Behavioral: Positive for confusion.    Past Medical History:  Diagnosis Date  . Abnormal involuntary movements(781.0)   . Abnormality of gait   . Acid reflux   . Acute bronchitis   . Adult failure to thrive   . Anemia   . Anxiety   . Anxiety state, unspecified   . Atrial fibrillation (HCC)   . Carbuncle and furuncle of other specified sites   . Cardiomegaly   . CHF (congestive  heart failure) (HCC)   . Chronic airway obstruction, not elsewhere classified   . COPD (chronic obstructive pulmonary disease) (HCC)   . Coronary atherosclerosis of native coronary artery   . Depressive disorder, not elsewhere classified   . Diaphragmatic hernia without mention of obstruction or gangrene   . Duodenal ulcer, unspecified as acute or chronic, without hemorrhage, perforation, or obstruction   . Edema   . Edema   . Esophagitis, unspecified   . Hypertension   . Impacted cerumen   . Insomnia, unspecified   . Insomnia, unspecified   . Leukocytosis   . Leukocytosis, unspecified   . Loss of weight   . Loss of weight   . Myocardial infarction   . Open wound of scalp, without mention of complication   . Osteoporosis   . Other abnormal blood chemistry   . Other dyspnea and respiratory abnormality   . Personal history of methicillin resistant Staphylococcus aureus   . Reflux esophagitis   . Senile osteoporosis   . Sinus drainage   . Solitary pulmonary nodule   . Type II or unspecified type diabetes mellitus without mention of complication, not stated as uncontrolled   . Unspecified constipation   . Unspecified essential hypertension   . Unspecified fall   . Unspecified intestinal obstruction   . Unspecified pleural effusion   . Unspecified pleural effusion   . Weakness    Past Surgical History:  Procedure Laterality   Date  . ABDOMINAL HYSTERECTOMY  1991  . FRACTURE SURGERY  2011   hip  . HIP SURGERY    . LAPAROTOMY  04/04/2012   Procedure: EXPLORATORY LAPAROTOMY;  Surgeon: Haywood M Ingram, MD;  Location: [facility] OR;  Service: General;  Laterality: N/A;   Social History:   reports that she has never smoked. She has never used smokeless tobacco. She reports that she drinks alcohol. She reports that she does not use drugs.  Family History  Problem Relation Age of Onset  . Stroke Father     Medications: Patient's Medications  New Prescriptions   No medications on  file  Previous Medications   ACETAMINOPHEN (TYLENOL) 325 MG TABLET    Take 650 mg by mouth every 4 (four) hours as needed (for pain).    ALBUTEROL (PROVENTIL HFA;VENTOLIN HFA) 108 (90 BASE) MCG/ACT INHALER    Inhale 2 puffs into the lungs every 6 (six) hours as needed for wheezing or shortness of breath.   ASPIRIN 81 MG CHEWABLE TABLET    Chew 81 mg by mouth daily.   CHOLECALCIFEROL (VITAMIN D3) 2000 UNITS CAPSULE    Take 2,000 Units by mouth daily.   DILTIAZEM (CARDIZEM CD) 240 MG 24 HR CAPSULE    Take 240 mg by mouth daily.   DOCUSATE SODIUM (COLACE) 100 MG CAPSULE    Take 100 mg by mouth daily as needed for mild constipation.    GUAIFENESIN (MUCINEX) 600 MG 12 HR TABLET    Take 600 mg by mouth daily as needed for to loosen phlegm.   HYDROCHLOROTHIAZIDE (HYDRODIURIL) 25 MG TABLET    TAKE 1 TABLET DAILY FOR EDEMA   HYDROCORTISONE (ANUSOL-HC) 25 MG SUPPOSITORY    Place 25 mg rectally daily as needed (for itching caused my hemorrhoids).    LEVALBUTEROL (XOPENEX) 0.63 MG/3ML NEBULIZER SOLUTION    Take 3 mLs (0.63 mg total) by nebulization every 6 (six) hours as needed for wheezing or shortness of breath.   MIRTAZAPINE (REMERON) 15 MG TABLET    TAKE 1 TABLET AT BEDTIME   NUTRITIONAL SUPPLEMENTS (NUTRITIONAL SHAKE) LIQD    Take 1 Can by mouth 3 (three) times daily between meals. *[employer] Strawberry Shake*   OMEPRAZOLE (PRILOSEC) 20 MG CAPSULE    TAKE 1 CAPSULE DAILY TO REDUCE STOMACH ACID AND TO HELP PROTECT ESOPHAGUS   POLYETHYLENE GLYCOL (MIRALAX / GLYCOLAX) PACKET    Take 17 g by mouth daily as needed (for constipation).    POTASSIUM CHLORIDE 20 MEQ/15ML (10%) SOLN    Take 20 mEq by mouth daily.   PROTEIN SUPPLEMENT (RESOURCE BENEPROTEIN) POWD    Take 6 g by mouth daily.   PSYLLIUM (METAMUCIL) 58.6 % POWDER    Take 1 packet by mouth every other day.   SENNA-DOCUSATE (SENEXON-S) 8.6-50 MG TABLET    Take 1 tablet by mouth every other day as needed for moderate constipation.   SERTRALINE  (ZOLOFT) 25 MG TABLET    Take 1 tablet (25 mg total) by mouth daily.  Modified Medications   No medications on file  Discontinued Medications   No medications on file     Physical Exam:  Vitals:   05/29/16 1407  BP: (!) 122/58  Pulse: (!) 52  Resp: 18  Temp: 97.8 F (36.6 C)  TempSrc: Oral  SpO2: 93%  Weight: 83 lb 9.6 oz (37.9 kg)  Height: 5' 3" (1.6 m)   Body mass index is 14.81 kg/m.  Physical Exam  Constitutional:  Frail white   female, walks with walker  HENT:  Head: Normocephalic and atraumatic.  Cardiovascular: Normal rate, regular rhythm, normal heart sounds and intact distal pulses.   Pulmonary/Chest: Effort normal and breath sounds normal. No respiratory distress. She has no wheezes.  Abdominal: Soft. Bowel sounds are normal.  Musculoskeletal: Normal range of motion. She exhibits edema (1+ pitting edema to bilateral lower legs).  Walks with walker  Neurological: She is alert.  Short term memory loss  Skin: Skin is warm and dry.  Psychiatric: She has a normal mood and affect.    Labs reviewed: Basic Metabolic Panel:  Recent Labs  08/09/15 1516  NA 141  K 3.2*  CL 101  CO2 27  GLUCOSE 138*  BUN 29*  CREATININE 1.15  CALCIUM 9.8  TSH 2.82   Liver Function Tests: No results for input(s): AST, ALT, ALKPHOS, BILITOT, PROT, ALBUMIN in the last 8760 hours. No results for input(s): LIPASE, AMYLASE in the last 8760 hours. No results for input(s): AMMONIA in the last 8760 hours. CBC:  Recent Labs  08/09/15 1516  WBC 17.2*  NEUTROABS 11.0*  HGB 13.3  HCT 40.5  MCV 87.3  PLT 515.0*   Lipid Panel: No results for input(s): CHOL, HDL, LDLCALC, TRIG, CHOLHDL, LDLDIRECT in the last 8760 hours. TSH:  Recent Labs  08/09/15 1516  TSH 2.82   A1C: Lab Results  Component Value Date   HGBA1C 6.4 (H) 03/23/2016     Assessment/Plan 1. Loose stools -worse with decrease in metamucil, will increase to daily - psyllium (METAMUCIL) 58.6 % powder;  Take 1 packet by mouth daily.  Dispense: 283 g  2. Edema of left lower leg due to peripheral venous insufficiency -combination of poor nutrition and venous insufficiency -conts on HCTZ, will follow up blood work regarding medication and nutritional status -to cont on nutritional supplements.  -2 gm sodium diet -to wear compression hose daily  - CMP with eGFR  3. Chronic diastolic congestive heart failure, NYHA class 1 (HCC) Stable at this time. No worsening shortness of breath or chest pain. - CMP with eGFR   K. , AGNP  [facility] [phone](Monday-Friday 8 am - 5 pm) [phone] (after hours)  

## 2016-05-29 NOTE — Patient Instructions (Addendum)
Will increase metamucil to daily- monitor stools.  2 gm sodium diet.   To wear compression hose daily for swelling  Elevate Legs when sitting   To take imperial [employer]co shake three times daily

## 2016-06-13 ENCOUNTER — Encounter: Payer: Self-pay | Admitting: Internal Medicine

## 2016-06-19 ENCOUNTER — Ambulatory Visit (INDEPENDENT_AMBULATORY_CARE_PROVIDER_SITE_OTHER): Payer: Medicare Other | Admitting: Internal Medicine

## 2016-06-19 ENCOUNTER — Encounter: Payer: Self-pay | Admitting: Internal Medicine

## 2016-06-19 VITALS — BP 158/70 | HR 58 | Temp 98.5°F | Wt 81.0 lb

## 2016-06-19 DIAGNOSIS — J438 Other emphysema: Secondary | ICD-10-CM

## 2016-06-19 DIAGNOSIS — I5032 Chronic diastolic (congestive) heart failure: Secondary | ICD-10-CM | POA: Diagnosis not present

## 2016-06-19 DIAGNOSIS — I872 Venous insufficiency (chronic) (peripheral): Secondary | ICD-10-CM | POA: Diagnosis not present

## 2016-06-19 DIAGNOSIS — K5901 Slow transit constipation: Secondary | ICD-10-CM

## 2016-06-19 DIAGNOSIS — E1142 Type 2 diabetes mellitus with diabetic polyneuropathy: Secondary | ICD-10-CM

## 2016-06-19 DIAGNOSIS — E440 Moderate protein-calorie malnutrition: Secondary | ICD-10-CM

## 2016-06-19 NOTE — Progress Notes (Signed)
Location:  [facility] clinic Provider:  Pegeen Stiger L. Mariea Clonts, D.O., C.M.D.  Code Status: DNR Goals of Care:  Advanced Directives 06/19/2016  Does Patient Have a Medical Advance Directive? Yes  Type of Paramedic of [place];Out of facility DNR (pink MOST or yellow form)  Does patient want to make changes to medical advance directive? -  Copy of [place] in Chart? Yes  Pre-existing out of facility DNR order (yellow form or pink MOST form) -     Chief Complaint  Patient presents with  . Medical Management of Chronic Issues    3 mth follow-up    HPI: Patient is a 82 y.o. female seen today for medical management of chronic diseases.    She is doing well lately.  She still gets dyspneic on exertion.  Unfortunately, she continues to lose weight--down 3 more lbs since last visit which was less than a month ago.  She drinks a lot of fluids, but eats very little despite attempts with multiple supplements.    She is wearing her compression hose as directed now.  Staff are helping her put them on in the am and off at hs.    She has no pain.  She is no longer having problems with loose stools.  She has a h/o constipation, but does not like to stick with a regimen before changing it or c/o diarrhea.    Her memory continues to decline gradually.  She repeats herself frequently during the appt. She does not recall the things she c/o to her daughter in law.     Past Medical History:  Diagnosis Date  . Abnormal involuntary movements(781.0)   . Abnormality of gait   . Acid reflux   . Acute bronchitis   . Adult failure to thrive   . Anemia   . Anxiety   . Anxiety state, unspecified   . Atrial fibrillation ([place])   . Carbuncle and furuncle of other specified sites   . Cardiomegaly   . CHF (congestive heart failure) ([place])   . Chronic airway obstruction, not elsewhere classified   . COPD (chronic obstructive pulmonary disease) ([place])   . Coronary  atherosclerosis of native coronary artery   . Depressive disorder, not elsewhere classified   . Diaphragmatic hernia without mention of obstruction or gangrene   . Duodenal ulcer, unspecified as acute or chronic, without hemorrhage, perforation, or obstruction   . Edema   . Edema   . Esophagitis, unspecified   . Hypertension   . Impacted cerumen   . Insomnia, unspecified   . Insomnia, unspecified   . Leukocytosis   . Leukocytosis, unspecified   . Loss of weight   . Loss of weight   . Myocardial infarction   . Open wound of scalp, without mention of complication   . Osteoporosis   . Other abnormal blood chemistry   . Other dyspnea and respiratory abnormality   . Personal history of methicillin resistant Staphylococcus aureus   . Reflux esophagitis   . Senile osteoporosis   . Sinus drainage   . Solitary pulmonary nodule   . Type II or unspecified type diabetes mellitus without mention of complication, not stated as uncontrolled   . Unspecified constipation   . Unspecified essential hypertension   . Unspecified fall   . Unspecified intestinal obstruction   . Unspecified pleural effusion   . Unspecified pleural effusion   . Weakness     Past Surgical History:  Procedure Laterality Date  .  ABDOMINAL HYSTERECTOMY  1991  . FRACTURE SURGERY  2011   hip  . HIP SURGERY    . LAPAROTOMY  04/04/2012   Procedure: EXPLORATORY LAPAROTOMY;  Surgeon: Adin Hector, MD;  Location: [facility];  Service: General;  Laterality: N/A;    No Known Allergies  Allergies as of 06/19/2016   No Known Allergies     Medication List       Accurate as of 06/19/16 11:47 AM. Always use your most recent med list.          acetaminophen 325 MG tablet Commonly known as:  TYLENOL Take 650 mg by mouth every 4 (four) hours as needed (for pain).   albuterol 108 (90 Base) MCG/ACT inhaler Commonly known as:  PROVENTIL HFA;VENTOLIN HFA Inhale 2 puffs into the lungs every 6 (six) hours as needed for  wheezing or shortness of breath.   aspirin 81 MG chewable tablet Chew 81 mg by mouth daily.   diltiazem 240 MG 24 hr capsule Commonly known as:  CARDIZEM CD Take 240 mg by mouth daily.   docusate sodium 100 MG capsule Commonly known as:  COLACE Take 100 mg by mouth daily as needed for mild constipation.   guaiFENesin 600 MG 12 hr tablet Commonly known as:  MUCINEX Take 600 mg by mouth daily as needed for to loosen phlegm.   hydrochlorothiazide 25 MG tablet Commonly known as:  HYDRODIURIL TAKE 1 TABLET DAILY FOR EDEMA   hydrocortisone 25 MG suppository Commonly known as:  ANUSOL-HC Place 25 mg rectally daily as needed (for itching caused my hemorrhoids).   levalbuterol 0.63 MG/3ML nebulizer solution Commonly known as:  XOPENEX Take 3 mLs (0.63 mg total) by nebulization every 6 (six) hours as needed for wheezing or shortness of breath.   mirtazapine 15 MG tablet Commonly known as:  REMERON TAKE 1 TABLET AT BEDTIME   NUTRITIONAL SHAKE Liqd Take 1 Can by mouth 3 (three) times daily between meals. *[employer] Strawberry Shake*   omeprazole 20 MG capsule Commonly known as:  PRILOSEC TAKE 1 CAPSULE DAILY TO REDUCE STOMACH ACID AND TO HELP PROTECT ESOPHAGUS   polyethylene glycol packet Commonly known as:  MIRALAX / GLYCOLAX Take 17 g by mouth daily as needed (for constipation).   potassium chloride 20 MEQ/15ML (10%) Soln Take 20 mEq by mouth daily.   protein supplement Powd Take 6 g by mouth daily.   psyllium 58.6 % powder Commonly known as:  METAMUCIL Take 1 packet by mouth daily.   senna-docusate 8.6-50 MG tablet Commonly known as:  SENEXON-S Take 1 tablet by mouth every other day as needed for moderate constipation.   sertraline 25 MG tablet Commonly known as:  ZOLOFT Take 1 tablet (25 mg total) by mouth daily.   Vitamin D3 2000 units capsule Take 2,000 Units by mouth daily.       Review of Systems:  Review of Systems  Constitutional: Positive for  weight loss. Negative for chills, fever and malaise/fatigue.  HENT: Negative for congestion.   Eyes: Negative for blurred vision.  Respiratory: Positive for shortness of breath. Negative for cough and sputum production.   Cardiovascular: Negative for chest pain and palpitations.  Gastrointestinal: Negative for abdominal pain, blood in stool, constipation and melena.  Genitourinary: Negative for dysuria, frequency and urgency.  Musculoskeletal: Negative for falls, joint pain and myalgias.  Neurological: Negative for dizziness, loss of consciousness and weakness.  Endo/Heme/Allergies: Does not bruise/bleed easily.  Psychiatric/Behavioral: Negative for depression and memory loss.  Health Maintenance  Topic Date Due  . OPHTHALMOLOGY EXAM  12/31/1924  . URINE MICROALBUMIN  [birth year]26  . MAMMOGRAM  08/17/2012  . FOOT EXAM  05/22/2015  . HEMOGLOBIN A1C  09/20/2016  . INFLUENZA VACCINE  Completed  . DEXA SCAN  Completed  . ZOSTAVAX  Completed  . PNA vac Low Risk Adult  Completed    Physical Exam: Vitals:   06/19/16 1129  BP: (!) 158/70  Pulse: (!) 58  Temp: 98.5 F (36.9 C)  TempSrc: Oral  SpO2: 95%  Weight: 81 lb (36.7 kg)   Body mass index is 14.35 kg/m. Physical Exam  Constitutional: She is oriented to person, place, and time.  Frail white female  Cardiovascular: Normal rate, regular rhythm, normal heart sounds and intact distal pulses.   Pulmonary/Chest: Effort normal and breath sounds normal.  Abdominal: Soft. Bowel sounds are normal.  Musculoskeletal: Normal range of motion.  Walks with walker  Neurological: She is alert and oriented to person, place, and time.  Skin: Skin is warm and dry.  Psychiatric: She has a normal mood and affect.    Labs reviewed: Basic Metabolic Panel:  Recent Labs  08/09/15 1516 05/29/16 1442  NA 141 140  K 3.2* 5.0  CL 101 103  CO2 27 29  GLUCOSE 138* 110*  BUN 29* 27*  CREATININE 1.15 1.03*  CALCIUM 9.8 9.4  TSH 2.82  --      Liver Function Tests:  Recent Labs  05/29/16 1442  AST 14  ALT 8  ALKPHOS 68  BILITOT 0.3  PROT 6.4  ALBUMIN 3.6   No results for input(s): LIPASE, AMYLASE in the last 8760 hours. No results for input(s): AMMONIA in the last 8760 hours. CBC:  Recent Labs  08/09/15 1516  WBC 17.2*  NEUTROABS 11.0*  HGB 13.3  HCT 40.5  MCV 87.3  PLT 515.0*   Lipid Panel: No results for input(s): CHOL, HDL, LDLCALC, TRIG, CHOLHDL, LDLDIRECT in the last 8760 hours. Lab Results  Component Value Date   HGBA1C 6.4 (H) 03/23/2016    Assessment/Plan 1. Moderate protein-calorie malnutrition ([place]) -continues to lose weight--food intake is poor -does not like most supplements, does take a milkshake and quite a bit of fluids  2. Type 2 diabetes mellitus with diabetic polyneuropathy, without long-term current use of insulin (HCC) -has been under great control with her poor intake, no hypoglycemia   3. Constipation, slow transit -cont current bowel regimen -goes through spurts of constipation and loose stools and regimen gets changed every couple of weeks   4. Other emphysema ([place]) -cont xopenex nebs  5. Chronic diastolic congestive heart failure, NYHA class 1 (HCC) -stable, not on diuretic regimen   6. Chronic venous insufficiency -source of edema, better with compression hose  Labs/tests ordered:  Palliative care referral Next appt:  Visit date not found  Caryssa Elzey L. Danita Proud, D.O. [facility] [address], [place] [address] Cell Phone (Mon-Fri 8am-5pm):  [phone] On Call:  [phone] & follow prompts after 5pm & weekends Office Phone:  [phone] Office Fax:  [phone]

## 2016-06-19 NOTE — Patient Instructions (Signed)
Palliative care referral.

## 2016-06-24 DIAGNOSIS — J438 Other emphysema: Secondary | ICD-10-CM | POA: Diagnosis not present

## 2016-06-24 DIAGNOSIS — K5901 Slow transit constipation: Secondary | ICD-10-CM | POA: Diagnosis not present

## 2016-06-24 DIAGNOSIS — E1142 Type 2 diabetes mellitus with diabetic polyneuropathy: Secondary | ICD-10-CM | POA: Diagnosis not present

## 2016-06-24 DIAGNOSIS — I252 Old myocardial infarction: Secondary | ICD-10-CM | POA: Diagnosis not present

## 2016-06-24 DIAGNOSIS — E440 Moderate protein-calorie malnutrition: Secondary | ICD-10-CM | POA: Diagnosis not present

## 2016-06-24 DIAGNOSIS — I2510 Atherosclerotic heart disease of native coronary artery without angina pectoris: Secondary | ICD-10-CM | POA: Diagnosis not present

## 2016-06-24 DIAGNOSIS — R627 Adult failure to thrive: Secondary | ICD-10-CM | POA: Diagnosis not present

## 2016-06-24 DIAGNOSIS — R634 Abnormal weight loss: Secondary | ICD-10-CM | POA: Diagnosis not present

## 2016-06-28 DIAGNOSIS — R634 Abnormal weight loss: Secondary | ICD-10-CM | POA: Diagnosis not present

## 2016-06-30 DIAGNOSIS — R634 Abnormal weight loss: Secondary | ICD-10-CM | POA: Diagnosis not present

## 2016-06-30 DIAGNOSIS — E440 Moderate protein-calorie malnutrition: Secondary | ICD-10-CM | POA: Diagnosis not present

## 2016-07-05 ENCOUNTER — Telehealth: Payer: Self-pay

## 2016-07-05 DIAGNOSIS — E440 Moderate protein-calorie malnutrition: Secondary | ICD-10-CM | POA: Diagnosis not present

## 2016-07-05 DIAGNOSIS — R634 Abnormal weight loss: Secondary | ICD-10-CM | POA: Diagnosis not present

## 2016-07-05 NOTE — Telephone Encounter (Signed)
This is why I ordered palliative care for her.  Is she no longer getting the magic cups or ensures or any of the other supplements?  They tend to get stopped between visits via faxes when she refuses them.  She's tried just about everything made.  Megace is not a safe medication and does not work.

## 2016-07-05 NOTE — Telephone Encounter (Signed)
LMOM for Bianca Khan with [name] to return call.

## 2016-07-05 NOTE — Telephone Encounter (Signed)
Hinton Dyer, nurse with [name] called to request megace or some type of meal replacement for patient. Patient with continual weight loss. Patient eats 1 meal daily (2 eggs and some grits). Patient's current weight is 82 pounds according to facility scales.  Please advise

## 2016-07-10 DIAGNOSIS — R634 Abnormal weight loss: Secondary | ICD-10-CM | POA: Diagnosis not present

## 2016-07-10 DIAGNOSIS — E440 Moderate protein-calorie malnutrition: Secondary | ICD-10-CM | POA: Diagnosis not present

## 2016-07-10 NOTE — Telephone Encounter (Signed)
Left message on voicemail for Bianca Khan to return call when available  I also called patient's son and he was not sure what patient is getting and will call back once he finds out

## 2016-07-12 MED ORDER — BENEPROTEIN PO POWD: 1.0000 | Freq: Every day | ORAL | 5 refills | Status: DC

## 2016-07-12 NOTE — Telephone Encounter (Signed)
Spoke with Hinton Dyer, patient is getting magic cup and ensure. Patient eats fruit snacks but never a full meals. Hinton Dyer verbalized understanding of Dr.Reed's reasoning to refuse magace.

## 2016-07-15 ENCOUNTER — Encounter (HOSPITAL_COMMUNITY): Payer: Self-pay | Admitting: Emergency Medicine

## 2016-07-15 ENCOUNTER — Emergency Department (HOSPITAL_COMMUNITY): Payer: Medicare Other

## 2016-07-15 ENCOUNTER — Inpatient Hospital Stay (HOSPITAL_COMMUNITY)
Admission: EM | Admit: 2016-07-15 | Discharge: 2016-08-03 | DRG: 308 | Disposition: E | Payer: Medicare Other | Attending: Internal Medicine | Admitting: Internal Medicine

## 2016-07-15 DIAGNOSIS — I252 Old myocardial infarction: Secondary | ICD-10-CM

## 2016-07-15 DIAGNOSIS — I1310 Hypertensive heart and chronic kidney disease without heart failure, with stage 1 through stage 4 chronic kidney disease, or unspecified chronic kidney disease: Secondary | ICD-10-CM | POA: Diagnosis present

## 2016-07-15 DIAGNOSIS — Z515 Encounter for palliative care: Secondary | ICD-10-CM | POA: Diagnosis present

## 2016-07-15 DIAGNOSIS — D72829 Elevated white blood cell count, unspecified: Secondary | ICD-10-CM | POA: Diagnosis present

## 2016-07-15 DIAGNOSIS — J449 Chronic obstructive pulmonary disease, unspecified: Secondary | ICD-10-CM | POA: Diagnosis present

## 2016-07-15 DIAGNOSIS — Z9981 Dependence on supplemental oxygen: Secondary | ICD-10-CM

## 2016-07-15 DIAGNOSIS — R0602 Shortness of breath: Secondary | ICD-10-CM | POA: Diagnosis not present

## 2016-07-15 DIAGNOSIS — E43 Unspecified severe protein-calorie malnutrition: Secondary | ICD-10-CM | POA: Diagnosis present

## 2016-07-15 DIAGNOSIS — I482 Chronic atrial fibrillation: Secondary | ICD-10-CM | POA: Diagnosis present

## 2016-07-15 DIAGNOSIS — I442 Atrioventricular block, complete: Principal | ICD-10-CM | POA: Diagnosis present

## 2016-07-15 DIAGNOSIS — Z823 Family history of stroke: Secondary | ICD-10-CM

## 2016-07-15 DIAGNOSIS — F0390 Unspecified dementia without behavioral disturbance: Secondary | ICD-10-CM | POA: Diagnosis present

## 2016-07-15 DIAGNOSIS — Z681 Body mass index (BMI) 19 or less, adult: Secondary | ICD-10-CM

## 2016-07-15 DIAGNOSIS — I2510 Atherosclerotic heart disease of native coronary artery without angina pectoris: Secondary | ICD-10-CM | POA: Diagnosis present

## 2016-07-15 DIAGNOSIS — E1122 Type 2 diabetes mellitus with diabetic chronic kidney disease: Secondary | ICD-10-CM | POA: Diagnosis present

## 2016-07-15 DIAGNOSIS — N179 Acute kidney failure, unspecified: Secondary | ICD-10-CM | POA: Diagnosis not present

## 2016-07-15 DIAGNOSIS — R64 Cachexia: Secondary | ICD-10-CM | POA: Diagnosis present

## 2016-07-15 DIAGNOSIS — I5041 Acute combined systolic (congestive) and diastolic (congestive) heart failure: Secondary | ICD-10-CM | POA: Diagnosis present

## 2016-07-15 DIAGNOSIS — N189 Chronic kidney disease, unspecified: Secondary | ICD-10-CM | POA: Diagnosis present

## 2016-07-15 DIAGNOSIS — R05 Cough: Secondary | ICD-10-CM | POA: Diagnosis not present

## 2016-07-15 DIAGNOSIS — Z7982 Long term (current) use of aspirin: Secondary | ICD-10-CM

## 2016-07-15 DIAGNOSIS — I130 Hypertensive heart and chronic kidney disease with heart failure and stage 1 through stage 4 chronic kidney disease, or unspecified chronic kidney disease: Secondary | ICD-10-CM | POA: Diagnosis present

## 2016-07-15 DIAGNOSIS — I5043 Acute on chronic combined systolic (congestive) and diastolic (congestive) heart failure: Secondary | ICD-10-CM | POA: Diagnosis present

## 2016-07-15 DIAGNOSIS — Z8711 Personal history of peptic ulcer disease: Secondary | ICD-10-CM

## 2016-07-15 DIAGNOSIS — M810 Age-related osteoporosis without current pathological fracture: Secondary | ICD-10-CM | POA: Diagnosis present

## 2016-07-15 DIAGNOSIS — R001 Bradycardia, unspecified: Secondary | ICD-10-CM | POA: Diagnosis present

## 2016-07-15 DIAGNOSIS — E876 Hypokalemia: Secondary | ICD-10-CM | POA: Diagnosis present

## 2016-07-15 DIAGNOSIS — R069 Unspecified abnormalities of breathing: Secondary | ICD-10-CM | POA: Diagnosis not present

## 2016-07-15 DIAGNOSIS — Z66 Do not resuscitate: Secondary | ICD-10-CM | POA: Diagnosis present

## 2016-07-15 DIAGNOSIS — R627 Adult failure to thrive: Secondary | ICD-10-CM | POA: Diagnosis present

## 2016-07-15 DIAGNOSIS — Z8614 Personal history of Methicillin resistant Staphylococcus aureus infection: Secondary | ICD-10-CM

## 2016-07-15 DIAGNOSIS — F411 Generalized anxiety disorder: Secondary | ICD-10-CM | POA: Diagnosis present

## 2016-07-15 DIAGNOSIS — Z90710 Acquired absence of both cervix and uterus: Secondary | ICD-10-CM

## 2016-07-15 DIAGNOSIS — K219 Gastro-esophageal reflux disease without esophagitis: Secondary | ICD-10-CM | POA: Diagnosis present

## 2016-07-15 DIAGNOSIS — Z7189 Other specified counseling: Secondary | ICD-10-CM

## 2016-07-15 DIAGNOSIS — Z79899 Other long term (current) drug therapy: Secondary | ICD-10-CM

## 2016-07-15 LAB — BASIC METABOLIC PANEL
Anion gap: 19 — ABNORMAL HIGH (ref 5–15)
BUN: 45 mg/dL — AB (ref 6–20)
CALCIUM: 9.4 mg/dL (ref 8.9–10.3)
CHLORIDE: 99 mmol/L — AB (ref 101–111)
CO2: 22 mmol/L (ref 22–32)
CREATININE: 1.54 mg/dL — AB (ref 0.44–1.00)
GFR calc Af Amer: 31 mL/min — ABNORMAL LOW (ref >60)
GFR calc non Af Amer: 27 mL/min — ABNORMAL LOW (ref >60)
Glucose, Bld: 176 mg/dL — ABNORMAL HIGH (ref 65–99)
Potassium: 3.9 mmol/L (ref 3.5–5.1)
SODIUM: 140 mmol/L (ref 135–145)

## 2016-07-15 LAB — I-STAT TROPONIN, ED: Troponin i, poc: 0.07 ng/mL (ref 0.00–0.08)

## 2016-07-15 MED ORDER — IPRATROPIUM-ALBUTEROL 0.5-2.5 (3) MG/3ML IN SOLN
3.0000 mL | Freq: Once | RESPIRATORY_TRACT | Status: AC
  Administered 2016-07-15: 3 mL via RESPIRATORY_TRACT
  Filled 2016-07-15: qty 3

## 2016-07-15 MED ORDER — ALBUTEROL SULFATE (2.5 MG/3ML) 0.083% IN NEBU
5.0000 mg | INHALATION_SOLUTION | Freq: Once | RESPIRATORY_TRACT | Status: AC
  Administered 2016-07-15: 5 mg via RESPIRATORY_TRACT
  Filled 2016-07-15: qty 6

## 2016-07-15 NOTE — ED Triage Notes (Signed)
Per EMS pt was brought from [facility] and started feeling more SOB than usual. Productive cough.. Lungs sound wet. Crackles can be heard. Per EMS Sats were 61% room air upon arrival to [facility].  Placed on Non rebreather with 4l of O2 now At 99%. Bradycardic.

## 2016-07-15 NOTE — ED Provider Notes (Signed)
[facility] DEPT Provider Note   CSN: [id number] Arrival date & time: 07/31/2016  2238  By signing my name below, I, [name], attest that this documentation has been prepared under the direction and in the presence of Veryl Speak, MD . Electronically Signed: [name], Scribe. 07/24/2016. 11:30 PM.  History   Chief Complaint Chief Complaint  Patient presents with  . Shortness of Breath    The history is provided by the patient and the nursing home. No language interpreter was used.     HPI Comments:  Bianca Khan is a 83 y.o. female with a history of COPD and CHF, who presents to the Emergency Department from [facility] complaining of SOB which she has been experiencing for ~ 1 year, worse over the last few days. She reports associated productive cough. Pt states she uses home O2. NH staff reported bradycardia PTA. Pt denies CP, abdominal pain, and fever. No alleviating factors noted.    Past Medical History:  Diagnosis Date  . Abnormal involuntary movements(781.0)   . Abnormality of gait   . Acid reflux   . Acute bronchitis   . Adult failure to thrive   . Anemia   . Anxiety   . Anxiety state, unspecified   . Atrial fibrillation ([place])   . Carbuncle and furuncle of other specified sites   . Cardiomegaly   . CHF (congestive heart failure) ([place])   . Chronic airway obstruction, not elsewhere classified   . COPD (chronic obstructive pulmonary disease) ([place])   . Coronary atherosclerosis of native coronary artery   . Depressive disorder, not elsewhere classified   . Diaphragmatic hernia without mention of obstruction or gangrene   . Duodenal ulcer, unspecified as acute or chronic, without hemorrhage, perforation, or obstruction   . Edema   . Edema   . Esophagitis, unspecified   . Hypertension   . Impacted cerumen   . Insomnia, unspecified   . Insomnia, unspecified   . Leukocytosis   . Leukocytosis, unspecified   . Loss of weight   . Loss of weight   . Myocardial  infarction   . Open wound of scalp, without mention of complication   . Osteoporosis   . Other abnormal blood chemistry   . Other dyspnea and respiratory abnormality   . Personal history of methicillin resistant Staphylococcus aureus   . Reflux esophagitis   . Senile osteoporosis   . Sinus drainage   . Solitary pulmonary nodule   . Type II or unspecified type diabetes mellitus without mention of complication, not stated as uncontrolled   . Unspecified constipation   . Unspecified essential hypertension   . Unspecified fall   . Unspecified intestinal obstruction   . Unspecified pleural effusion   . Unspecified pleural effusion   . Weakness     Patient Active Problem List   Diagnosis Date Noted  . Dyspnea 08/09/2015  . Back pain 02/25/2015  . Nausea without vomiting 08/20/2014  . Emphysema of lung ([place]) 01/15/2014  . Type 2 diabetes mellitus with diabetic polyneuropathy ([place]) 01/15/2014  . Chronic frontal sinusitis 01/15/2014  . First degree hemorrhoids 01/15/2014  . Edema 10/06/2013  . Essential tremor 10/06/2013  . Sinusitis, chronic 01/09/2013  . Hemorrhoids 01/09/2013  . Constipation, slow transit 09/23/2012  . CAD (coronary artery disease) on Pradaxa pre-op 04/05/2012  . Protein calorie malnutrition ([place]) 04/05/2012  . Tachycardia 04/05/2012  . Chronic Thrombocytosis 04/05/2012  . Chronic diastolic congestive heart failure, NYHA class 1 ([place]) 04/05/2012  .  Pulmonary HTN - 41 mmHg 04/05/2012  . Diabetes mellitus ([place]) 04/05/2012  . Partial small bowel obstruction s/p EL/LOA 03/23/2012  . Nausea and vomiting 03/23/2012  . Dehydration 03/23/2012  . Hypokalemia 03/23/2012  . Chronic Leukocytosis 03/23/2012  . ARF (acute renal failure) ([place]) 03/23/2012  . Hypertension   . Acid reflux   . CHF (congestive heart failure) ([place])   . Anxiety   . Alzheimer's disease 08/06/2011    Past Surgical History:  Procedure Laterality Date  . ABDOMINAL HYSTERECTOMY  1991  .  FRACTURE SURGERY  2011   hip  . HIP SURGERY    . LAPAROTOMY  04/04/2012   Procedure: EXPLORATORY LAPAROTOMY;  Surgeon: Adin Hector, MD;  Location: [facility];  Service: General;  Laterality: N/A;    OB History    No data available       Home Medications    Prior to Admission medications   Medication Sig Start Date End Date Taking? Authorizing Provider  acetaminophen (TYLENOL) 325 MG tablet Take 650 mg by mouth every 4 (four) hours as needed (for pain).     Historical Provider, MD  albuterol (PROVENTIL HFA;VENTOLIN HFA) 108 (90 Base) MCG/ACT inhaler Inhale 2 puffs into the lungs every 6 (six) hours as needed for wheezing or shortness of breath.    Historical Provider, MD  aspirin 81 MG chewable tablet Chew 81 mg by mouth daily.    Historical Provider, MD  Cholecalciferol (VITAMIN D3) 2000 units capsule Take 2,000 Units by mouth daily.    Historical Provider, MD  diltiazem (CARDIZEM CD) 240 MG 24 hr capsule Take 240 mg by mouth daily.    Historical Provider, MD  docusate sodium (COLACE) 100 MG capsule Take 100 mg by mouth daily as needed for mild constipation.     Historical Provider, MD  guaiFENesin (MUCINEX) 600 MG 12 hr tablet Take 600 mg by mouth daily as needed for to loosen phlegm.    Historical Provider, MD  hydrochlorothiazide (HYDRODIURIL) 25 MG tablet TAKE 1 TABLET DAILY FOR EDEMA 11/16/14   Tiffany L Reed, DO  hydrocortisone (ANUSOL-HC) 25 MG suppository Place 25 mg rectally daily as needed (for itching caused my hemorrhoids).     Historical Provider, MD  levalbuterol Penne Lash) 0.63 MG/3ML nebulizer solution Take 3 mLs (0.63 mg total) by nebulization every 6 (six) hours as needed for wheezing or shortness of breath. 07/26/15   Tiffany L Reed, DO  mirtazapine (REMERON) 15 MG tablet TAKE 1 TABLET AT BEDTIME 03/09/15   Tiffany L Reed, DO  Nutritional Supplements (NUTRITIONAL SHAKE) LIQD Take 1 Can by mouth 3 (three) times daily between meals. *Landscape architect*     Historical Provider, MD  omeprazole (PRILOSEC) 20 MG capsule TAKE 1 CAPSULE DAILY TO REDUCE STOMACH ACID AND TO HELP PROTECT ESOPHAGUS 01/15/15   Tiffany L Reed, DO  polyethylene glycol (MIRALAX / GLYCOLAX) packet Take 17 g by mouth daily as needed (for constipation).     Historical Provider, MD  potassium chloride 20 MEQ/15ML (10%) SOLN Take 20 mEq by mouth daily.    Historical Provider, MD  protein supplement (RESOURCE BENEPROTEIN) POWD Take 6 g by mouth daily. 07/12/16   Tiffany L Reed, DO  psyllium (METAMUCIL) 58.6 % powder Take 1 packet by mouth daily. 05/29/16   Lauree Chandler, NP  senna-docusate (SENEXON-S) 8.6-50 MG tablet Take 1 tablet by mouth every other day as needed for moderate constipation. 09/10/15   Tiffany L Reed, DO  sertraline (ZOLOFT) 25 MG  tablet Take 1 tablet (25 mg total) by mouth daily. 04/01/15   Gayland Curry, DO    Family History Family History  Problem Relation Age of Onset  . Stroke Father     Social History Social History  Substance Use Topics  . Smoking status: Never Smoker  . Smokeless tobacco: Never Used  . Alcohol use No     Comment: rarely     Allergies   Patient has no known allergies.   Review of Systems Review of Systems  Constitutional: Negative for fever.  Respiratory: Positive for cough and shortness of breath.   Cardiovascular: Negative for chest pain.  Gastrointestinal: Negative for abdominal pain.  All other systems reviewed and are negative.    Physical Exam Updated Vital Signs Pulse (!) 42   Temp 97.5 F (36.4 C) (Axillary)   Resp (!) 28   SpO2 100%   Physical Exam  Constitutional: She is oriented to person, place, and time. She appears well-developed and well-nourished. No distress.  HENT:  Head: Normocephalic and atraumatic.  Eyes: EOM are normal.  Neck: Normal range of motion.  Cardiovascular: Regular rhythm and normal heart sounds.   Bradycardic  Pulmonary/Chest: Effort normal. She has rales.  Rales in the  bases bilaterally  Abdominal: Soft. She exhibits no distension. There is no tenderness.  Musculoskeletal: Normal range of motion. She exhibits edema.  1-2+ pitting edema of the BLE   Neurological: She is alert and oriented to person, place, and time.  Skin: Skin is warm and dry.  Psychiatric: She has a normal mood and affect. Judgment normal.  Nursing note and vitals reviewed.    ED Treatments / Results  DIAGNOSTIC STUDIES:  Oxygen Saturation is 100% on 4L [place], normal by my interpretation.    COORDINATION OF CARE:  11:14 PM Discussed treatment plan with pt at bedside and pt agreed to plan.  Labs (all labs ordered are listed, but only abnormal results are displayed) Labs Reviewed - No data to display  EKG  EKG Interpretation None       Radiology No results found.  Procedures Procedures (including critical care time)  Medications Ordered in ED Medications  albuterol (PROVENTIL) (2.5 MG/3ML) 0.083% nebulizer solution 5 mg (not administered)     Initial Impression / Assessment and Plan / ED Course  I have reviewed the triage vital signs and the nursing notes.  Pertinent labs & imaging results that were available during my care of the patient were reviewed by me and considered in my medical decision making (see chart for details).  Clinical Course     Patient is a 83 year old female brought for evaluation of shortness of breath, weakness, and congestion. Her EKG reveals a complete heart block and chest x-ray and BNP are suggestive of congestive heart failure. I suspect she is not tolerating this heart block well and this is from her into CHF. I have consult with the hospitalist who will admit the patient for further evaluation. The patient is a DO NOT RESUSCITATE.    Final Clinical Impressions(s) / ED Diagnoses   Final diagnoses:  None    New Prescriptions New Prescriptions   No medications on file   I personally performed the services described in this  documentation, which was scribed in my presence. The recorded information has been reviewed and is accurate.        Veryl Speak, MD 07/16/16 [phone]

## 2016-07-15 NOTE — ED Notes (Signed)
Notified edp istat troponin results

## 2016-07-16 DIAGNOSIS — I5041 Acute combined systolic (congestive) and diastolic (congestive) heart failure: Secondary | ICD-10-CM | POA: Diagnosis present

## 2016-07-16 DIAGNOSIS — I1310 Hypertensive heart and chronic kidney disease without heart failure, with stage 1 through stage 4 chronic kidney disease, or unspecified chronic kidney disease: Secondary | ICD-10-CM | POA: Diagnosis present

## 2016-07-16 DIAGNOSIS — Z7982 Long term (current) use of aspirin: Secondary | ICD-10-CM | POA: Diagnosis not present

## 2016-07-16 DIAGNOSIS — Z66 Do not resuscitate: Secondary | ICD-10-CM | POA: Diagnosis present

## 2016-07-16 DIAGNOSIS — R0602 Shortness of breath: Secondary | ICD-10-CM | POA: Diagnosis not present

## 2016-07-16 DIAGNOSIS — N189 Chronic kidney disease, unspecified: Secondary | ICD-10-CM | POA: Diagnosis present

## 2016-07-16 DIAGNOSIS — Z681 Body mass index (BMI) 19 or less, adult: Secondary | ICD-10-CM | POA: Diagnosis not present

## 2016-07-16 DIAGNOSIS — I252 Old myocardial infarction: Secondary | ICD-10-CM | POA: Diagnosis not present

## 2016-07-16 DIAGNOSIS — I442 Atrioventricular block, complete: Secondary | ICD-10-CM | POA: Diagnosis not present

## 2016-07-16 DIAGNOSIS — E876 Hypokalemia: Secondary | ICD-10-CM | POA: Diagnosis present

## 2016-07-16 DIAGNOSIS — R627 Adult failure to thrive: Secondary | ICD-10-CM | POA: Diagnosis present

## 2016-07-16 DIAGNOSIS — J449 Chronic obstructive pulmonary disease, unspecified: Secondary | ICD-10-CM | POA: Diagnosis present

## 2016-07-16 DIAGNOSIS — D72829 Elevated white blood cell count, unspecified: Secondary | ICD-10-CM | POA: Diagnosis not present

## 2016-07-16 DIAGNOSIS — R001 Bradycardia, unspecified: Secondary | ICD-10-CM | POA: Diagnosis present

## 2016-07-16 DIAGNOSIS — Z823 Family history of stroke: Secondary | ICD-10-CM | POA: Diagnosis not present

## 2016-07-16 DIAGNOSIS — I130 Hypertensive heart and chronic kidney disease with heart failure and stage 1 through stage 4 chronic kidney disease, or unspecified chronic kidney disease: Secondary | ICD-10-CM | POA: Diagnosis not present

## 2016-07-16 DIAGNOSIS — R64 Cachexia: Secondary | ICD-10-CM | POA: Diagnosis present

## 2016-07-16 DIAGNOSIS — I2510 Atherosclerotic heart disease of native coronary artery without angina pectoris: Secondary | ICD-10-CM | POA: Diagnosis present

## 2016-07-16 DIAGNOSIS — F0390 Unspecified dementia without behavioral disturbance: Secondary | ICD-10-CM | POA: Diagnosis present

## 2016-07-16 DIAGNOSIS — Z90710 Acquired absence of both cervix and uterus: Secondary | ICD-10-CM | POA: Diagnosis not present

## 2016-07-16 DIAGNOSIS — I5043 Acute on chronic combined systolic (congestive) and diastolic (congestive) heart failure: Secondary | ICD-10-CM | POA: Diagnosis present

## 2016-07-16 DIAGNOSIS — E43 Unspecified severe protein-calorie malnutrition: Secondary | ICD-10-CM | POA: Diagnosis present

## 2016-07-16 DIAGNOSIS — Z79899 Other long term (current) drug therapy: Secondary | ICD-10-CM | POA: Diagnosis not present

## 2016-07-16 DIAGNOSIS — Z515 Encounter for palliative care: Secondary | ICD-10-CM | POA: Diagnosis present

## 2016-07-16 DIAGNOSIS — Z9981 Dependence on supplemental oxygen: Secondary | ICD-10-CM | POA: Diagnosis not present

## 2016-07-16 DIAGNOSIS — N179 Acute kidney failure, unspecified: Secondary | ICD-10-CM | POA: Diagnosis not present

## 2016-07-16 DIAGNOSIS — E1122 Type 2 diabetes mellitus with diabetic chronic kidney disease: Secondary | ICD-10-CM | POA: Diagnosis present

## 2016-07-16 DIAGNOSIS — Z7189 Other specified counseling: Secondary | ICD-10-CM | POA: Diagnosis not present

## 2016-07-16 LAB — CBC WITH DIFFERENTIAL/PLATELET
Basophils Absolute: 0 10*3/uL (ref 0.0–0.1)
Basophils Relative: 0 %
EOS ABS: 0 10*3/uL (ref 0.0–0.7)
EOS PCT: 0 %
HEMATOCRIT: 39 % (ref 36.0–46.0)
Hemoglobin: 12.7 g/dL (ref 12.0–15.0)
LYMPHS ABS: 2.2 10*3/uL (ref 0.7–4.0)
Lymphocytes Relative: 7 %
MCH: 29.1 pg (ref 26.0–34.0)
MCHC: 32.6 g/dL (ref 30.0–36.0)
MCV: 89.2 fL (ref 78.0–100.0)
MONO ABS: 0.6 10*3/uL (ref 0.1–1.0)
Monocytes Relative: 2 %
Neutro Abs: 28.5 10*3/uL — ABNORMAL HIGH (ref 1.7–7.7)
Neutrophils Relative %: 91 %
PLATELETS: 431 10*3/uL — AB (ref 150–400)
RBC: 4.37 MIL/uL (ref 3.87–5.11)
RDW: 13.3 % (ref 11.5–15.5)
WBC: 31.3 10*3/uL — AB (ref 4.0–10.5)

## 2016-07-16 LAB — MRSA PCR SCREENING: MRSA by PCR: NEGATIVE

## 2016-07-16 LAB — BRAIN NATRIURETIC PEPTIDE: B NATRIURETIC PEPTIDE 5: 464 pg/mL — AB (ref 0.0–100.0)

## 2016-07-16 MED ORDER — GUAIFENESIN ER 600 MG PO TB12: 600.0000 mg | ORAL_TABLET | Freq: Every day | ORAL | Status: DC | PRN

## 2016-07-16 MED ORDER — HYDRALAZINE HCL 20 MG/ML IJ SOLN
2.0000 mg | Freq: Once | INTRAMUSCULAR | Status: AC
  Administered 2016-07-16: 2 mg via INTRAVENOUS
  Filled 2016-07-16: qty 1

## 2016-07-16 MED ORDER — ENSURE ENLIVE PO LIQD: 237.0000 mL | Freq: Three times a day (TID) | ORAL | Status: DC

## 2016-07-16 MED ORDER — ALBUTEROL SULFATE (2.5 MG/3ML) 0.083% IN NEBU: 3.0000 mL | INHALATION_SOLUTION | Freq: Four times a day (QID) | RESPIRATORY_TRACT | Status: DC | PRN

## 2016-07-16 MED ORDER — LEVALBUTEROL HCL 0.63 MG/3ML IN NEBU: 0.6300 mg | INHALATION_SOLUTION | Freq: Four times a day (QID) | RESPIRATORY_TRACT | Status: DC | PRN

## 2016-07-16 MED ORDER — MIRTAZAPINE 15 MG PO TABS
15.0000 mg | ORAL_TABLET | Freq: Every day | ORAL | Status: DC
  Filled 2016-07-16 (×4): qty 1

## 2016-07-16 MED ORDER — PANTOPRAZOLE SODIUM 40 MG PO TBEC
40.0000 mg | DELAYED_RELEASE_TABLET | Freq: Every day | ORAL | Status: DC
  Administered 2016-07-16: 40 mg via ORAL
  Filled 2016-07-16 (×2): qty 1

## 2016-07-16 MED ORDER — ACETAMINOPHEN 325 MG PO TABS: 650.0000 mg | ORAL_TABLET | Freq: Four times a day (QID) | ORAL | Status: DC | PRN

## 2016-07-16 MED ORDER — BENZOCAINE 10 % MT GEL: 1.0000 "application " | Freq: Four times a day (QID) | OROMUCOSAL | Status: DC | PRN

## 2016-07-16 MED ORDER — PIPERACILLIN-TAZOBACTAM 3.375 G IVPB
3.3750 g | Freq: Once | INTRAVENOUS | Status: AC
  Administered 2016-07-16: 3.375 g via INTRAVENOUS
  Filled 2016-07-16: qty 50

## 2016-07-16 MED ORDER — PIPERACILLIN-TAZOBACTAM IN DEX 2-0.25 GM/50ML IV SOLN
2.2500 g | Freq: Three times a day (TID) | INTRAVENOUS | Status: DC
  Administered 2016-07-16 – 2016-07-18 (×7): 2.25 g via INTRAVENOUS
  Filled 2016-07-16 (×7): qty 50

## 2016-07-16 MED ORDER — SENNOSIDES-DOCUSATE SODIUM 8.6-50 MG PO TABS
1.0000 | ORAL_TABLET | ORAL | Status: DC | PRN
  Filled 2016-07-16: qty 1

## 2016-07-16 MED ORDER — FUROSEMIDE 10 MG/ML IJ SOLN
20.0000 mg | Freq: Two times a day (BID) | INTRAMUSCULAR | Status: DC
  Administered 2016-07-16 – 2016-07-17 (×3): 20 mg via INTRAVENOUS
  Filled 2016-07-16 (×3): qty 2

## 2016-07-16 MED ORDER — SODIUM CHLORIDE 0.9% FLUSH
3.0000 mL | Freq: Two times a day (BID) | INTRAVENOUS | Status: DC
  Administered 2016-07-16 – 2016-07-18 (×5): 3 mL via INTRAVENOUS

## 2016-07-16 MED ORDER — DOCUSATE SODIUM 100 MG PO CAPS: 100.0000 mg | ORAL_CAPSULE | Freq: Every day | ORAL | Status: DC | PRN

## 2016-07-16 MED ORDER — PRO-STAT SUGAR FREE PO LIQD
30.0000 mL | Freq: Every day | ORAL | Status: DC
  Administered 2016-07-16: 30 mL via ORAL
  Filled 2016-07-16 (×2): qty 30

## 2016-07-16 MED ORDER — SERTRALINE HCL 50 MG PO TABS
25.0000 mg | ORAL_TABLET | Freq: Every day | ORAL | Status: DC
  Administered 2016-07-16: 25 mg via ORAL
  Filled 2016-07-16 (×2): qty 1

## 2016-07-16 MED ORDER — PHENOL 1.4 % MT LIQD: 1.0000 | OROMUCOSAL | Status: DC | PRN

## 2016-07-16 MED ORDER — POLYETHYLENE GLYCOL 3350 17 G PO PACK: 17.0000 g | PACK | Freq: Every day | ORAL | Status: DC | PRN

## 2016-07-16 MED ORDER — VANCOMYCIN HCL IN DEXTROSE 1-5 GM/200ML-% IV SOLN: 1000.0000 mg | Freq: Once | INTRAVENOUS | Status: DC

## 2016-07-16 MED ORDER — HYDROCORTISONE ACETATE 25 MG RE SUPP
25.0000 mg | Freq: Every day | RECTAL | Status: DC | PRN
  Filled 2016-07-16: qty 1

## 2016-07-16 MED ORDER — PSYLLIUM 95 % PO PACK
1.0000 | PACK | Freq: Every day | ORAL | Status: DC
  Filled 2016-07-16 (×3): qty 1

## 2016-07-16 MED ORDER — ENOXAPARIN SODIUM 30 MG/0.3ML ~~LOC~~ SOLN
20.0000 mg | SUBCUTANEOUS | Status: DC
Start: 2016-07-16 — End: 2016-07-18
  Administered 2016-07-16 – 2016-07-18 (×3): 20 mg via SUBCUTANEOUS
  Filled 2016-07-16 (×2): qty 0.3
  Filled 2016-07-16 (×3): qty 0.2
  Filled 2016-07-16: qty 0.3

## 2016-07-16 MED ORDER — ASPIRIN 81 MG PO CHEW
81.0000 mg | CHEWABLE_TABLET | Freq: Every day | ORAL | Status: DC
  Administered 2016-07-16: 81 mg via ORAL
  Filled 2016-07-16 (×2): qty 1

## 2016-07-16 NOTE — ED Notes (Signed)
Attempted report x1.  Name and callback number provided.   

## 2016-07-16 NOTE — Progress Notes (Signed)
Pt became significantly sob with elimination on BSC. Oxygen saturation decreased to 80s on 4L[place] and would not recover greater than 85 with increase to [facility] along with cessation of activity. Non-rebreather placed. Sats increased to upper 90s immediately. Will leave in place until pt comfortable and attempt to return to [facility]

## 2016-07-16 NOTE — Progress Notes (Signed)
Patient ID: Bianca Khan, female   DOB: [birth year], 83 y.o.   MRN: [id number]    SUBJECTIVE: HR in 50s this morning, wavy baseline so hard to tell rhythm (HR higher than last night).  BP stable to elevated.  Mild dyspnea.    Scheduled Meds: . aspirin  81 mg Oral Daily  . enoxaparin (LOVENOX) injection  20 mg Subcutaneous Q24H  . feeding supplement (ENSURE ENLIVE)  237 mL Oral TID BM  . feeding supplement (PRO-STAT SUGAR FREE 64)  30 mL Oral Daily  . furosemide  20 mg Intravenous Q12H  . [START ON 07/17/2016] mirtazapine  15 mg Oral QHS  . pantoprazole  40 mg Oral Daily  . psyllium  1 packet Oral Daily  . sertraline  25 mg Oral Daily  . sodium chloride flush  3 mL Intravenous Q12H   Continuous Infusions: PRN Meds:.acetaminophen, albuterol, benzocaine, docusate sodium, guaiFENesin, hydrocortisone, levalbuterol, phenol, polyethylene glycol, senna-docusate    Vitals:   07/16/16 0535 07/16/16 0545 07/16/16 0550 07/16/16 0758  BP:  (!) 120/94  (!) 157/57  Pulse: (!) 49 (!) 49  (!) 49  Resp: (!) 32 (!) 31  (!) 22  Temp:    97.5 F (36.4 C)  TempSrc:    Oral  SpO2: 93% 91%  94%  Weight:   88 lb 3.2 oz (40 kg)   Height:   5' (1.524 m)     Intake/Output Summary (Last 24 hours) at 07/16/16 0931 Last data filed at 07/16/16 0800  Gross per 24 hour  Intake                0 ml  Output               50 ml  Net              -50 ml    LABS: Basic Metabolic Panel:  Recent Labs  07/27/2016 2332  NA 140  K 3.9  CL 99*  CO2 22  GLUCOSE 176*  BUN 45*  CREATININE 1.54*  CALCIUM 9.4   Liver Function Tests: No results for input(s): AST, ALT, ALKPHOS, BILITOT, PROT, ALBUMIN in the last 72 hours. No results for input(s): LIPASE, AMYLASE in the last 72 hours. CBC:  Recent Labs  07/25/2016 2332  WBC 31.3*  NEUTROABS 28.5*  HGB 12.7  HCT 39.0  MCV 89.2  PLT 431*   Cardiac Enzymes: No results for input(s): CKTOTAL, CKMB, CKMBINDEX, TROPONINI in the last 72 hours. BNP: Invalid  input(s): POCBNP D-Dimer: No results for input(s): DDIMER in the last 72 hours. Hemoglobin A1C: No results for input(s): HGBA1C in the last 72 hours. Fasting Lipid Panel: No results for input(s): CHOL, HDL, LDLCALC, TRIG, CHOLHDL, LDLDIRECT in the last 72 hours. Thyroid Function Tests: No results for input(s): TSH, T4TOTAL, T3FREE, THYROIDAB in the last 72 hours.  Invalid input(s): FREET3 Anemia Panel: No results for input(s): VITAMINB12, FOLATE, FERRITIN, TIBC, IRON, RETICCTPCT in the last 72 hours.  RADIOLOGY: Dg Chest Port 1 View  Result Date: 07/16/2016 CLINICAL DATA:  Acute onset of productive cough and shortness of breath. Initial encounter. EXAM: PORTABLE CHEST 1 VIEW COMPARISON:  Chest radiograph from 08/09/2015 FINDINGS: The lungs are well-aerated. Vascular congestion is noted. Increased interstitial markings raise concern for mild interstitial edema. There is no evidence of pleural effusion or pneumothorax. The cardiomediastinal silhouette is mildly enlarged. No acute osseous abnormalities are seen. External pacing pads are noted. IMPRESSION: Vascular congestion and mild cardiomegaly. Increased interstitial markings  raise concern for mild interstitial edema. Electronically Signed   By: Garald Balding M.D.   On: 07/16/2016 00:16    PHYSICAL EXAM General: NAD, cachectic Neck: JVP 8-9 cm, no thyromegaly or thyroid nodule.  Lungs: Rhonchi bilaterally. CV: Nondisplaced PMI.  Heart regular S1/S2, no S3/S4, no murmur.  No peripheral edema.   Abdomen: Soft, nontender, no hepatosplenomegaly, no distention.  Neurologic: Alert and oriented x 3.  Psych: Normal affect. Extremities: No clubbing or cyanosis.   TELEMETRY: Reviewed telemetry pt in HR 50s, underlying rhythm difficult to tell with wavy baseline  ASSESSMENT AND PLAN: 83 yo with history of COPD, malnutrition with cachexia, HFpEF presented with dyspnea and was found to have complete heart block.  1. Complete heart block: HR 40s  last night with preserved BP.  Now up to 50s but hard to discern rhythm given wavy baseline on telemetry.  She has not been on beta blocker or CCB at home.  BP stable.  - ECG today for rhythm. - She does not want a pacemaker.  This is a reasonable decision given comorbities.  Plan for palliative care involvement, she is now DNR.  2. COPD: Seems quite significant by exam.  3. Acute on chronic presumed diastolic CHF: Mild JVD, mild elevation in BNP.  Given dyspnea, think trial of gentle diuresis is reasonable.  Follow creatinine closely.   Cardiology will sign off given plans for palliative care.  Please call with questions.   Loralie Champagne 07/16/2016 9:35 AM

## 2016-07-16 NOTE — Progress Notes (Addendum)
Pt seen and examined, admitted this am by Dr.gardner 89/F with COPD/CHF/Severe malnutrition, very poor PO intake and ongoing failure to thrive admitted with Complete heart block and CHF exacerbation, has chronic leukocytosis with acute worsening too and AKi. -I called son Arva Yousif this am and told him that at 70 with severe malnutrition and failure to thrive she would not be a candidate for a pacemaker. Pt doesn't want a pacemaker anyway. She is clear abt being a DNR -Recommended Palliative consult, and he is agreeable -IV lasix today, ON IV Zosyn, no clear source of infection, last TSH was normal  Domenic Polite, MD

## 2016-07-16 NOTE — Consult Note (Signed)
CARDIOLOGY CONSULT NOTE   Patient ID: Bianca Khan MRN: [id number] DOB/AGE: [birth year] 81 y.o.  Admit date: 07/14/2016  Requesting Physician: Primary Physician:   Bianca Kinnier, DO Primary Cardiologist:   N/A Reason for Consultation:   Complete heart block  HPI: Bianca Khan is a 83 y.o. female with a PMH significant for COPD, DM, HFpEF, chronic leukocytosis, COPD, and severe protein malnutrition (36kg) who presented to the ED with worsening DOE and was subsequently found to be in complete heart block.  [name]' functional status has declined over recent months and she has continued to lose weight in that time.  Per review of her EMR including PCP notes, the pt has suffered from mild DOE over the past 6-10 weeks.  She endorsed an increase in this dyspnea over the past 24H and also developed a productive cough.  She denies light-headedness, dizziness, presyncopal or syncopal symptoms.  The pt is currently resting comfortably in bed and has no acute complaints.     Past Medical History:  Diagnosis Date  . Abnormal involuntary movements(781.0)   . Abnormality of gait   . Acid reflux   . Acute bronchitis   . Adult failure to thrive   . Anemia   . Anxiety   . Anxiety state, unspecified   . Atrial fibrillation ([place])   . Carbuncle and furuncle of other specified sites   . Cardiomegaly   . CHF (congestive heart failure) ([place])   . Chronic airway obstruction, not elsewhere classified   . COPD (chronic obstructive pulmonary disease) ([place])   . Coronary atherosclerosis of native coronary artery   . Depressive disorder, not elsewhere classified   . Diaphragmatic hernia without mention of obstruction or gangrene   . Duodenal ulcer, unspecified as acute or chronic, without hemorrhage, perforation, or obstruction   . Edema   . Edema   . Esophagitis, unspecified   . Hypertension   . Impacted cerumen   . Insomnia, unspecified   . Insomnia, unspecified   . Leukocytosis   .  Leukocytosis, unspecified   . Loss of weight   . Loss of weight   . Myocardial infarction   . Open wound of scalp, without mention of complication   . Osteoporosis   . Other abnormal blood chemistry   . Other dyspnea and respiratory abnormality   . Personal history of methicillin resistant Staphylococcus aureus   . Reflux esophagitis   . Senile osteoporosis   . Sinus drainage   . Solitary pulmonary nodule   . Type II or unspecified type diabetes mellitus without mention of complication, not stated as uncontrolled   . Unspecified constipation   . Unspecified essential hypertension   . Unspecified fall   . Unspecified intestinal obstruction   . Unspecified pleural effusion   . Unspecified pleural effusion   . Weakness      Past Surgical History:  Procedure Laterality Date  . ABDOMINAL HYSTERECTOMY  1991  . FRACTURE SURGERY  2011   hip  . HIP SURGERY    . LAPAROTOMY  04/04/2012   Procedure: EXPLORATORY LAPAROTOMY;  Surgeon: Adin Hector, MD;  Location: [facility];  Service: General;  Laterality: N/A;    No Known Allergies  I have reviewed the patient's current medications . aspirin  81 mg Oral Daily  . enoxaparin (LOVENOX) injection  40 mg Subcutaneous Q24H  . [START ON 07/17/2016] mirtazapine  15 mg Oral QHS  . NUTRITIONAL SHAKE  1 Can Oral TID  BM  . pantoprazole  40 mg Oral Daily  . protein supplement  1 scoop Oral Daily  . psyllium  1 packet Oral Daily  . sertraline  25 mg Oral Daily  . sodium chloride flush  3 mL Intravenous Q12H   . vancomycin     acetaminophen, albuterol, benzocaine, docusate sodium, guaiFENesin, hydrocortisone, levalbuterol, phenol, polyethylene glycol, senna-docusate  Prior to Admission medications   Medication Sig Start Date End Date Taking? Authorizing Provider  acetaminophen (TYLENOL) 325 MG tablet Take 650 mg by mouth every 6 (six) hours as needed for mild pain.    Yes Historical Provider, MD  albuterol (PROVENTIL HFA;VENTOLIN HFA) 108 (90  Base) MCG/ACT inhaler Inhale 2 puffs into the lungs every 6 (six) hours as needed for wheezing or shortness of breath.   Yes Historical Provider, MD  aspirin 81 MG chewable tablet Chew 81 mg by mouth daily.   Yes Historical Provider, MD  benzocaine (ORAJEL) 10 % mucosal gel Use as directed 1 application in the mouth or throat 4 (four) times daily as needed for mouth pain.   Yes Historical Provider, MD  Cholecalciferol (VITAMIN D3) 2000 units capsule Take 2,000 Units by mouth daily.   Yes Historical Provider, MD  docusate sodium (COLACE) 100 MG capsule Take 100 mg by mouth daily as needed for mild constipation.    Yes Historical Provider, MD  guaiFENesin (MUCINEX) 600 MG 12 hr tablet Take 600 mg by mouth daily as needed for to loosen phlegm.   Yes Historical Provider, MD  hydrochlorothiazide (HYDRODIURIL) 25 MG tablet TAKE 1 TABLET DAILY FOR EDEMA 11/16/14  Yes Tiffany L Reed, DO  hydrocortisone (ANUSOL-HC) 25 MG suppository Place 25 mg rectally daily as needed (for itching caused my hemorrhoids).    Yes Historical Provider, MD  levalbuterol Penne Lash) 0.63 MG/3ML nebulizer solution Take 3 mLs (0.63 mg total) by nebulization every 6 (six) hours as needed for wheezing or shortness of breath. 07/26/15  Yes Tiffany L Reed, DO  mirtazapine (REMERON) 15 MG tablet TAKE 1 TABLET AT BEDTIME 03/09/15  Yes Tiffany L Reed, DO  Nutritional Supplements (NUTRITIONAL SHAKE) LIQD Take 1 Can by mouth 3 (three) times daily between meals. *Landscape architect*   Yes Historical Provider, MD  omeprazole (PRILOSEC) 20 MG capsule TAKE 1 CAPSULE DAILY TO REDUCE STOMACH ACID AND TO HELP PROTECT ESOPHAGUS 01/15/15  Yes Tiffany L Reed, DO  phenol (CHLORASEPTIC) 1.4 % LIQD Use as directed 1 spray in the mouth or throat every 4 (four) hours as needed for throat irritation / pain.   Yes Historical Provider, MD  polyethylene glycol (MIRALAX / GLYCOLAX) packet Take 17 g by mouth daily as needed (for constipation).    Yes  Historical Provider, MD  potassium chloride 20 MEQ/15ML (10%) SOLN Take 20 mEq by mouth daily.   Yes Historical Provider, MD  protein supplement (RESOURCE BENEPROTEIN) POWD Take 6 g by mouth daily. 07/12/16  Yes Tiffany L Reed, DO  psyllium (METAMUCIL) 58.6 % powder Take 1 packet by mouth daily. 05/29/16  Yes Lauree Chandler, NP  senna-docusate (SENEXON-S) 8.6-50 MG tablet Take 1 tablet by mouth every other day as needed for moderate constipation. 09/10/15  Yes Tiffany L Reed, DO  sertraline (ZOLOFT) 25 MG tablet Take 1 tablet (25 mg total) by mouth daily. 04/01/15  Yes Tiffany L Reed, DO  Skin Protectants, Misc. (BAZA PROTECT EX) Apply topically daily as needed (to red areas on buttocks).   Yes Historical Provider, MD  trolamine salicylate (  ASPERCREME) 10 % cream Apply 1 application topically every 4 (four) hours as needed for muscle pain.   Yes Historical Provider, MD     Social History   Social History  . Marital status: Widowed    Spouse name: N/A  . Number of children: N/A  . Years of education: N/A   Occupational History  . Not on file.   Social History Main Topics  . Smoking status: Never Smoker  . Smokeless tobacco: Never Used  . Alcohol use No     Comment: rarely  . Drug use: No  . Sexual activity: No     Comment: unknown   Other Topics Concern  . Not on file   Social History Narrative  . No narrative on file    Family Status  Relation Status  . Father Deceased at age 71  . Mother Deceased at age 48  . Sister Alive  . Son Alive   sleep apnea  . Sister Alive  . Son Alive   hyperlipidemia   Family History  Problem Relation Age of Onset  . Stroke Father     ROS:  Full 14 point review of systems complete and found to be negative unless listed above.  Physical Exam: Blood pressure (!) 129/45, pulse (!) 48, temperature 97.5 F (36.4 C), temperature source Axillary, resp. rate (!) 27, SpO2 95 %.  General: cachectic, frail appearing, NAD, oriented to person  and time Head: dry mucous membranes.  Lungs: diffuse coarse breath sounds throughout bilateral lung Nez with intermittent scattered expiratory wheezing Heart: bradycardic, regular rhythm, cardiac sounds difficult to appreciate given diffuse coarse breath sounds but +S1 +S2 and no significant m/r/g, no JVD  Abdomen: Bowel sounds present, abdomen soft and non-tender without masses or hernias noted. Neuro: Alert and oriented X 3. No focal deficits noted. Psych:  Good affect, responds appropriately Skin: +skin tenting  Labs:   Lab Results  Component Value Date   WBC 31.3 (H) 07/22/2016   HGB 12.7 07/12/2016   HCT 39.0 07/11/2016   MCV 89.2 07/12/2016   PLT 431 (H) 07/17/2016   No results for input(s): INR in the last 72 hours.  Recent Labs Lab 07/09/2016 2332  NA 140  K 3.9  CL 99*  CO2 22  BUN 45*  CREATININE 1.54*  CALCIUM 9.4  GLUCOSE 176*   Magnesium  Date Value Ref Range Status  04/11/2012 1.4 (L) 1.5 - 2.5 mg/dL Final   No results for input(s): CKTOTAL, CKMB, TROPONINI in the last 72 hours.  Recent Labs  07/18/2016 2338  TROPIPOC 0.07   Pro B Natriuretic peptide (BNP)  Date/Time Value Ref Range Status  08/09/2015 03:16 PM 284.0 (H) 0.0 - 100.0 pg/mL Final  03/08/2010 08:30 PM 330.0 (H) 0.0 - 100.0 pg/mL Final   Lab Results  Component Value Date   CHOL 186 05/19/2014   HDL 63 05/19/2014   LDLCALC 93 05/19/2014   TRIG 152 (H) 05/19/2014   No results found for: DDIMER Lipase  Date/Time Value Ref Range Status  08/20/2014 02:22 PM 14 0 - 59 U/L Final   Amylase  Date/Time Value Ref Range Status  08/20/2014 02:22 PM 35 31 - 124 U/L Final   TSH  Date/Time Value Ref Range Status  08/09/2015 03:16 PM 2.82 0.35 - 4.50 uIU/mL Final   No results found for: VITAMINB12, FOLATE, FERRITIN, TIBC, IRON, RETICCTPCT  ECG:  Per my review most recent tracing reveals complete heart block with a junctional escape rhythm in  the 40s, normal access, no ST or Twave changes  indicative of ischemia  Radiology:  Dg Chest Port 1 View  Result Date: 07/16/2016 CLINICAL DATA:  Acute onset of productive cough and shortness of breath. Initial encounter. EXAM: PORTABLE CHEST 1 VIEW COMPARISON:  Chest radiograph from 08/09/2015 FINDINGS: The lungs are well-aerated. Vascular congestion is noted. Increased interstitial markings raise concern for mild interstitial edema. There is no evidence of pleural effusion or pneumothorax. The cardiomediastinal silhouette is mildly enlarged. No acute osseous abnormalities are seen. External pacing pads are noted. IMPRESSION: Vascular congestion and mild cardiomegaly. Increased interstitial markings raise concern for mild interstitial edema. Electronically Signed   By: Garald Balding M.D.   On: 07/16/2016 00:16    ASSESSMENT AND PLAN:    Bianca Khan is a 83 y.o. female with a PMH significant for COPD, DM, HFpEF, chronic leukocytosis, COPD, and severe protein malnutrition (36kg) who presented to the ED with worsening DOE and was subsequently found to be in complete heart block.  She has a junctional escape rhythm in the 40s and is otherwise hemodynamically stable without indication for temporary pacing.  Bianca Khan is also DNR/DNI and as noted above has lost considerable weight over recent months as her functional status has declined.  I discussed the diagnosis of complete heart block with the patient and she seemed to understand and was able to repeat the information back to me.  She is clear in that she does not wish to undergo invasive procedures at this time, and given her cachectic state and goals of care it is not clear to me that PPM is in her best interest.  She is clinically stable at this time and further discussion of management options can be deferred until the morning when her son arrives.  Although she does have evidence of mild pulmonary vascular congestion on CXR I am concerned that she may be intravascularly depleted given flat neck  veins, skin tenting, and BUN/Cr of 45/1.5.  The complete heart block may contribute to decreased renal perfusion but her junctional escape rhythm is in the 40s and such a rate would not typically be the primary driver of such a significant AKI.  # Complete heart block; hemodynamically stable, no indication for temporary pacing - given age, functional status, goals of care, and cachexia that could limit wound healing a PPM may not be in the best interest of the patient at this time.  Further discussion between the cardiology service, the pt, and the pt's medical decision makers to occur in the am - continuous telemetry overnight - maintain K>4 and Mg>2 - avoid all AV nodal blocking agents  # AKI; pt appear intravascularly dry on my exam although also has evidence of mild pulmonary vascular congestion on CXR - suggest urine lytes and urine urea level to calculate FeNa and FeUrea.  Urine microscopy may also be helpful in determining if there is a component of ATN - would hold diuresis for now  # SOB; PNA vs pulmonary edema vs. Atelectasis - infectious w/u per primary team - pt may not need to maintain SaO2 near 100% given known COPD  # Leukocytosis; baseline WBC appears to be 15-17 and now 31.  W/u per primary team with concern for possible infection as noted   Principal Problem:   Complete heart block ([place]) Active Problems:   Chronic Leukocytosis   Acute combined systolic and diastolic congestive heart failure (H[facility])   Cardiorenal syndrome   Signed: Clayborne Dana, MD  07/16/2016 1:57 AM

## 2016-07-16 NOTE — H&P (Signed)
History and Physical    Bianca Khan [id number] DOB: [birth year] DOA: 07/08/2016   PCP: Hollace Kinnier, DO Chief Complaint:  Chief Complaint  Patient presents with  . Shortness of Breath    HPI: Bianca Khan is a 83 y.o. female with medical history significant of CHF, COPD, CAD, adult failure to thrive with ongoing weight loss, mild dementia.  At baseline the patient lives in a SNF, walks with a walker, has been having difficulty with weight loss over recent months and now only weighs 81 lbs today.  However, she is pretty "with it" despite her age, only having mild memory problems it appears and notes that before this past week her quality of life was "fairly decent".'  She has had mild DOE over the past 6-10 weeks.  Over the past day or two however, she has had SOB, cough, congestion, worsening BLE swelling.  Cough is productive.  Nothing makes symptoms better or worse.  No CP, no abd pain, no fevers.  NH staff reports low heart rate.  ED Course: EKG shows complete heart block with ventricular rate in the low 40s.  CXR shows mild pulmonary edema.  BMP shows creat of 1.5 up from baseline of 1.0.  WBC shows 31k; however, she does have chronic leukocytosis and has never had a WBC that was in the normal range going back to 2011 on our records.  Review of Systems: As per HPI otherwise 10 point review of systems negative.    Past Medical History:  Diagnosis Date  . Abnormal involuntary movements(781.0)   . Abnormality of gait   . Acid reflux   . Acute bronchitis   . Adult failure to thrive   . Anemia   . Anxiety   . Anxiety state, unspecified   . Atrial fibrillation ([place])   . Carbuncle and furuncle of other specified sites   . Cardiomegaly   . CHF (congestive heart failure) ([place])   . Chronic airway obstruction, not elsewhere classified   . COPD (chronic obstructive pulmonary disease) ([place])   . Coronary atherosclerosis of native coronary artery   . Depressive disorder, not elsewhere  classified   . Diaphragmatic hernia without mention of obstruction or gangrene   . Duodenal ulcer, unspecified as acute or chronic, without hemorrhage, perforation, or obstruction   . Edema   . Edema   . Esophagitis, unspecified   . Hypertension   . Impacted cerumen   . Insomnia, unspecified   . Insomnia, unspecified   . Leukocytosis   . Leukocytosis, unspecified   . Loss of weight   . Loss of weight   . Myocardial infarction   . Open wound of scalp, without mention of complication   . Osteoporosis   . Other abnormal blood chemistry   . Other dyspnea and respiratory abnormality   . Personal history of methicillin resistant Staphylococcus aureus   . Reflux esophagitis   . Senile osteoporosis   . Sinus drainage   . Solitary pulmonary nodule   . Type II or unspecified type diabetes mellitus without mention of complication, not stated as uncontrolled   . Unspecified constipation   . Unspecified essential hypertension   . Unspecified fall   . Unspecified intestinal obstruction   . Unspecified pleural effusion   . Unspecified pleural effusion   . Weakness     Past Surgical History:  Procedure Laterality Date  . ABDOMINAL HYSTERECTOMY  1991  . FRACTURE SURGERY  2011   hip  . HIP SURGERY    .  LAPAROTOMY  04/04/2012   Procedure: EXPLORATORY LAPAROTOMY;  Surgeon: Adin Hector, MD;  Location: [place];  Service: General;  Laterality: N/A;     reports that she has never smoked. She has never used smokeless tobacco. She reports that she does not drink alcohol or use drugs.  No Known Allergies  Family History  Problem Relation Age of Onset  . Stroke Father       Prior to Admission medications   Medication Sig Start Date End Date Taking? Authorizing Provider  acetaminophen (TYLENOL) 325 MG tablet Take 650 mg by mouth every 6 (six) hours as needed for mild pain.    Yes Historical Provider, MD  albuterol (PROVENTIL HFA;VENTOLIN HFA) 108 (90 Base) MCG/ACT inhaler Inhale 2 puffs  into the lungs every 6 (six) hours as needed for wheezing or shortness of breath.   Yes Historical Provider, MD  aspirin 81 MG chewable tablet Chew 81 mg by mouth daily.   Yes Historical Provider, MD  benzocaine (ORAJEL) 10 % mucosal gel Use as directed 1 application in the mouth or throat 4 (four) times daily as needed for mouth pain.   Yes Historical Provider, MD  Cholecalciferol (VITAMIN D3) 2000 units capsule Take 2,000 Units by mouth daily.   Yes Historical Provider, MD  docusate sodium (COLACE) 100 MG capsule Take 100 mg by mouth daily as needed for mild constipation.    Yes Historical Provider, MD  guaiFENesin (MUCINEX) 600 MG 12 hr tablet Take 600 mg by mouth daily as needed for to loosen phlegm.   Yes Historical Provider, MD  hydrochlorothiazide (HYDRODIURIL) 25 MG tablet TAKE 1 TABLET DAILY FOR EDEMA 11/16/14  Yes Tiffany L Reed, DO  hydrocortisone (ANUSOL-HC) 25 MG suppository Place 25 mg rectally daily as needed (for itching caused my hemorrhoids).    Yes Historical Provider, MD  levalbuterol Penne Lash) 0.63 MG/3ML nebulizer solution Take 3 mLs (0.63 mg total) by nebulization every 6 (six) hours as needed for wheezing or shortness of breath. 07/26/15  Yes Tiffany L Reed, DO  mirtazapine (REMERON) 15 MG tablet TAKE 1 TABLET AT BEDTIME 03/09/15  Yes Tiffany L Reed, DO  Nutritional Supplements (NUTRITIONAL SHAKE) LIQD Take 1 Can by mouth 3 (three) times daily between meals. *Landscape architect*   Yes Historical Provider, MD  omeprazole (PRILOSEC) 20 MG capsule TAKE 1 CAPSULE DAILY TO REDUCE STOMACH ACID AND TO HELP PROTECT ESOPHAGUS 01/15/15  Yes Tiffany L Reed, DO  phenol (CHLORASEPTIC) 1.4 % LIQD Use as directed 1 spray in the mouth or throat every 4 (four) hours as needed for throat irritation / pain.   Yes Historical Provider, MD  polyethylene glycol (MIRALAX / GLYCOLAX) packet Take 17 g by mouth daily as needed (for constipation).    Yes Historical Provider, MD  potassium chloride  20 MEQ/15ML (10%) SOLN Take 20 mEq by mouth daily.   Yes Historical Provider, MD  protein supplement (RESOURCE BENEPROTEIN) POWD Take 6 g by mouth daily. 07/12/16  Yes Tiffany L Reed, DO  psyllium (METAMUCIL) 58.6 % powder Take 1 packet by mouth daily. 05/29/16  Yes Lauree Chandler, NP  senna-docusate (SENEXON-S) 8.6-50 MG tablet Take 1 tablet by mouth every other day as needed for moderate constipation. 09/10/15  Yes Tiffany L Reed, DO  sertraline (ZOLOFT) 25 MG tablet Take 1 tablet (25 mg total) by mouth daily. 04/01/15  Yes Tiffany L Reed, DO  Skin Protectants, Misc. (BAZA PROTECT EX) Apply topically daily as needed (to red areas on buttocks).  Yes Historical Provider, MD  trolamine salicylate (ASPERCREME) 10 % cream Apply 1 application topically every 4 (four) hours as needed for muscle pain.   Yes Historical Provider, MD    Physical Exam: Vitals:   07/16/16 0000 07/16/16 0030 07/16/16 0100 07/16/16 0130  BP: (!) 141/46 (!) 121/44 (!) 140/42 (!) 129/45  Pulse: (!) 43  (!) 46 (!) 48  Resp: 23 (!) 28 21 (!) 27  Temp:      TempSrc:      SpO2: 100%  94% 95%      Constitutional: NAD, calm, comfortable Eyes: PERRL, lids and conjunctivae normal ENMT: Mucous membranes are moist. Posterior pharynx clear of any exudate or lesions.Normal dentition.  Neck: normal, supple, no masses, no thyromegaly Respiratory: Crackles Cardiovascular: Bradycardia Abdomen: no tenderness, no masses palpated. No hepatosplenomegaly. Bowel sounds positive.  Musculoskeletal: no clubbing / cyanosis. No joint deformity upper and lower extremities. Good ROM, no contractures. Normal muscle tone.  Skin: no rashes, lesions, ulcers. No induration Neurologic: CN 2-12 grossly intact. Sensation intact, DTR normal. Strength 5/5 in all 4.  Psychiatric: Normal judgment and insight. Alert and oriented x 3. Normal mood.    Labs on Admission: I have personally reviewed following labs and imaging studies  CBC:  Recent  Labs Lab 07/06/2016 2332  WBC 31.3*  NEUTROABS 28.5*  HGB 12.7  HCT 39.0  MCV 89.2  PLT 99991111*   Basic Metabolic Panel:  Recent Labs Lab 07/13/2016 2332  NA 140  K 3.9  CL 99*  CO2 22  GLUCOSE 176*  BUN 45*  CREATININE 1.54*  CALCIUM 9.4   GFR: CrCl cannot be calculated (Unknown ideal weight.). Liver Function Tests: No results for input(s): AST, ALT, ALKPHOS, BILITOT, PROT, ALBUMIN in the last 168 hours. No results for input(s): LIPASE, AMYLASE in the last 168 hours. No results for input(s): AMMONIA in the last 168 hours. Coagulation Profile: No results for input(s): INR, PROTIME in the last 168 hours. Cardiac Enzymes: No results for input(s): CKTOTAL, CKMB, CKMBINDEX, TROPONINI in the last 168 hours. BNP (last 3 results)  Recent Labs  08/09/15 1516  PROBNP 284.0*   HbA1C: No results for input(s): HGBA1C in the last 72 hours. CBG: No results for input(s): GLUCAP in the last 168 hours. Lipid Profile: No results for input(s): CHOL, HDL, LDLCALC, TRIG, CHOLHDL, LDLDIRECT in the last 72 hours. Thyroid Function Tests: No results for input(s): TSH, T4TOTAL, FREET4, T3FREE, THYROIDAB in the last 72 hours. Anemia Panel: No results for input(s): VITAMINB12, FOLATE, FERRITIN, TIBC, IRON, RETICCTPCT in the last 72 hours. Urine analysis:    Component Value Date/Time   COLORURINE YELLOW 03/23/2012 0636   APPEARANCEUR CLOUDY (A) 03/23/2012 0636   LABSPEC 1.024 03/23/2012 0636   PHURINE 7.5 03/23/2012 0636   GLUCOSEU NEGATIVE 03/23/2012 0636   HGBUR NEGATIVE 03/23/2012 0636   BILIRUBINUR NEGATIVE 03/23/2012 0636   KETONESUR NEGATIVE 03/23/2012 0636   PROTEINUR NEGATIVE 03/23/2012 0636   UROBILINOGEN 1.0 03/23/2012 0636   NITRITE NEGATIVE 03/23/2012 0636   LEUKOCYTESUR TRACE (A) 03/23/2012 0636   Sepsis Labs: @LABRCNTIP (procalcitonin:4,lacticidven:4) )No results found for this or any previous visit (from the past 240 hour(s)).   Radiological Exams on Admission: Dg  Chest Port 1 View  Result Date: 07/16/2016 CLINICAL DATA:  Acute onset of productive cough and shortness of breath. Initial encounter. EXAM: PORTABLE CHEST 1 VIEW COMPARISON:  Chest radiograph from 08/09/2015 FINDINGS: The lungs are well-aerated. Vascular congestion is noted. Increased interstitial markings raise concern for mild interstitial edema.  There is no evidence of pleural effusion or pneumothorax. The cardiomediastinal silhouette is mildly enlarged. No acute osseous abnormalities are seen. External pacing pads are noted. IMPRESSION: Vascular congestion and mild cardiomegaly. Increased interstitial markings raise concern for mild interstitial edema. Electronically Signed   By: Garald Balding M.D.   On: 07/16/2016 00:16    EKG: Independently reviewed.  Assessment/Plan Principal Problem:   Complete heart block (HCC) Active Problems:   Chronic Leukocytosis   Acute combined systolic and diastolic congestive heart failure (HCC)   Cardiorenal syndrome    1. Complete heart block - suspect that the bradycardia is causing low cardiac output resulting in CHF and her AKI on CKD is due to a cardiorenal syndrome from this. 1. Hemodynamically stable at the moment, so will admit to SDU 2. Appreciate cardiology consult (note is in chart) 3. Sounds like EP will be discussing at length wether or not she is a candidate for pacer with her and son in the morning. 4. Tele monitor in SDU 2. Acute CHF - 1. Will hold off on diuretics due to AKI, not clear if she will at all tolerate these 2. Holding HCTZ 3. AKI on CKD - suspicious for cardiorenal syndrome 1. FENA ordered if she can produce urine 2. Holding of on diuretics, but also holding off on fluids as the latter would worsen her CHF 3. Diuretics or fluids based on results of FENa 4. Chronic leukocytosis - 1. probably CLL but no formal diagnosis 2. Review of records does confirm that her WBC is always running high going all the way back to  2011 3. No other signs of PNA 4. So will hold off on ABX as I believe she doesn't have PNA at the moment and instead has CHF due to the newly found 3rd degree heart block as demonstrated on CXR, elevated BNP, lack of fever or other SIRS, etc.   DVT prophylaxis: Heparin [place] Code Status: Full Family Communication: No family in room Consults called: Cardiology has seen patient Admission status: Admit to inpatient   Etta Quill DO [facility] Pager [phone] from 7PM-7AM  If 7AM-7PM, please contact the day physician for the patient [email or web address] Password [email or web address]  07/16/2016, 2:09 AM    ]

## 2016-07-17 DIAGNOSIS — I5041 Acute combined systolic (congestive) and diastolic (congestive) heart failure: Secondary | ICD-10-CM

## 2016-07-17 DIAGNOSIS — I130 Hypertensive heart and chronic kidney disease with heart failure and stage 1 through stage 4 chronic kidney disease, or unspecified chronic kidney disease: Secondary | ICD-10-CM

## 2016-07-17 DIAGNOSIS — E43 Unspecified severe protein-calorie malnutrition: Secondary | ICD-10-CM | POA: Insufficient documentation

## 2016-07-17 LAB — BASIC METABOLIC PANEL
ANION GAP: 15 (ref 5–15)
BUN: 39 mg/dL — ABNORMAL HIGH (ref 6–20)
CHLORIDE: 98 mmol/L — AB (ref 101–111)
CO2: 33 mmol/L — AB (ref 22–32)
Calcium: 9.3 mg/dL (ref 8.9–10.3)
Creatinine, Ser: 1.19 mg/dL — ABNORMAL HIGH (ref 0.44–1.00)
GFR calc Af Amer: 42 mL/min — ABNORMAL LOW (ref >60)
GFR, EST NON AFRICAN AMERICAN: 36 mL/min — AB (ref >60)
GLUCOSE: 152 mg/dL — AB (ref 65–99)
POTASSIUM: 2.7 mmol/L — AB (ref 3.5–5.1)
Sodium: 146 mmol/L — ABNORMAL HIGH (ref 135–145)

## 2016-07-17 LAB — CBC
HEMATOCRIT: 37 % (ref 36.0–46.0)
HEMOGLOBIN: 11.8 g/dL — AB (ref 12.0–15.0)
MCH: 28.9 pg (ref 26.0–34.0)
MCHC: 31.9 g/dL (ref 30.0–36.0)
MCV: 90.5 fL (ref 78.0–100.0)
PLATELETS: 438 10*3/uL — AB (ref 150–400)
RBC: 4.09 MIL/uL (ref 3.87–5.11)
RDW: 13.6 % (ref 11.5–15.5)
WBC: 19.8 10*3/uL — AB (ref 4.0–10.5)

## 2016-07-17 MED ORDER — FUROSEMIDE 10 MG/ML IJ SOLN
20.0000 mg | Freq: Two times a day (BID) | INTRAMUSCULAR | Status: DC
  Administered 2016-07-17 – 2016-07-18 (×2): 20 mg via INTRAVENOUS
  Filled 2016-07-17 (×2): qty 2

## 2016-07-17 MED ORDER — SODIUM CHLORIDE 0.9 % IV SOLN
30.0000 meq | INTRAVENOUS | Status: AC
  Administered 2016-07-17 (×2): 30 meq via INTRAVENOUS
  Filled 2016-07-17 (×3): qty 15

## 2016-07-17 MED ORDER — FUROSEMIDE 20 MG PO TABS: 20.0000 mg | ORAL_TABLET | Freq: Every day | ORAL | Status: DC

## 2016-07-17 NOTE — Evaluation (Signed)
Clinical/Bedside Swallow Evaluation Patient Details  Name: Bianca Khan MRN: [id number] Date of Birth: October 22, 1914  Today's Date: 07/17/2016 Time: SLP Start Time (ACUTE ONLY): 94 SLP Stop Time (ACUTE ONLY): 1435 SLP Time Calculation (min) (ACUTE ONLY): 34 min  Past Medical History:  Past Medical History:  Diagnosis Date  . Abnormal involuntary movements(781.0)   . Abnormality of gait   . Acid reflux   . Acute bronchitis   . Adult failure to thrive   . Anemia   . Anxiety   . Anxiety state, unspecified   . Atrial fibrillation ([place])   . Carbuncle and furuncle of other specified sites   . Cardiomegaly   . CHF (congestive heart failure) ([place])   . Chronic airway obstruction, not elsewhere classified   . COPD (chronic obstructive pulmonary disease) ([place])   . Coronary atherosclerosis of native coronary artery   . Depressive disorder, not elsewhere classified   . Diaphragmatic hernia without mention of obstruction or gangrene   . Duodenal ulcer, unspecified as acute or chronic, without hemorrhage, perforation, or obstruction   . Edema   . Edema   . Esophagitis, unspecified   . Hypertension   . Impacted cerumen   . Insomnia, unspecified   . Insomnia, unspecified   . Leukocytosis   . Leukocytosis, unspecified   . Loss of weight   . Loss of weight   . Myocardial infarction   . Open wound of scalp, without mention of complication   . Osteoporosis   . Other abnormal blood chemistry   . Other dyspnea and respiratory abnormality   . Personal history of methicillin resistant Staphylococcus aureus   . Reflux esophagitis   . Senile osteoporosis   . Sinus drainage   . Solitary pulmonary nodule   . Type II or unspecified type diabetes mellitus without mention of complication, not stated as uncontrolled   . Unspecified constipation   . Unspecified essential hypertension   . Unspecified fall   . Unspecified intestinal obstruction   . Unspecified pleural effusion   . Unspecified  pleural effusion   . Weakness    Past Surgical History:  Past Surgical History:  Procedure Laterality Date  . ABDOMINAL HYSTERECTOMY  1991  . FRACTURE SURGERY  2011   hip  . HIP SURGERY    . LAPAROTOMY  04/04/2012   Procedure: EXPLORATORY LAPAROTOMY;  Surgeon: Adin Hector, MD;  Location: [place];  Service: General;  Laterality: N/A;   HPI:  Bianca Giller Fieldsis a 83 y.o.femalewith medical history significant of CHF, COPD, CAD, adult failure to thrive with ongoing weight loss, mild dementia. At baseline the patient lives in a SNF, walks with a walker, has been having difficulty with weight loss over recent months and now only weighs 81 lbs today. However, she is pretty "with it" despite her age, only having mild memory problems it appears and notes that before this past week her quality of life was "fairly decent". She has had mild DOE over the past 6-10 weeks. Over the past day or two however, she has had SOB, cough, congestion, worsening BLE swelling. Cough is productive. Nothing makes symptoms better or worse. No CP, no abd pain, no fevers. NH staff reports low heart rate. EKG shows complete heart block with ventricular rate in the low 40s. CXR shows mild pulmonary edema. BMP shows creat of 1.5 up from baseline of 1.0. WBC shows 31k; however, she does have chronic leukocytosis and has never had a WBC that was in the  normal range going back to 2011 on our records. Recent chest x-ray revealed vascular congestion and mild cardiomegaly. Increased interstitial markings raise concern for mild interstitial edema.    Assessment / Plan / Recommendation Clinical Impression  Pt appears at high aspiration d/t decreased respiratory status, swallow function and age. Pt currently on 6 liters O2 and is maintaining her O2 level with [place] placed at mouth (not in nose - d/t pt's mouth breathing). SLP moved [place] to nose with pt initially maintaining O2 at 95%. Pt with appearance of overall weak swallow response  to trials of ice chips c/b decreased visual hyolaryngeal excursion and audible rasp. Pt with delayed cough and destated to 87%, pt unable to recover O2 stats. Nursing moved [place] to front of mouth and pt's stats eventually went back to 90%. Nursing informs that pt destated during small sip of thin liquids via cup. Pt required Moderate cues for alertness during session. Given the above, ST recommends pt remain NPO. Nursing aware of recommendation. ST to follow during acute hospitalization.     Aspiration Risk  Severe aspiration risk;Risk for inadequate nutrition/hydration    Diet Recommendation NPO   Medication Administration: Via alternative means    Other  Recommendations Oral Care Recommendations: Oral care QID   Follow up Recommendations Skilled Nursing facility      Frequency and Duration min 2x/week  2 weeks       Prognosis Prognosis for Safe Diet Advancement: Guarded      Swallow Study   General Date of Onset: 07/03/2016 HPI: Bianca Khan a 83 y.o.femalewith medical history significant of CHF, COPD, CAD, adult failure to thrive with ongoing weight loss, mild dementia. At baseline the patient lives in a SNF, walks with a walker, has been having difficulty with weight loss over recent months and now only weighs 81 lbs today. However, she is pretty "with it" despite her age, only having mild memory problems it appears and notes that before this past week her quality of life was "fairly decent". She has had mild DOE over the past 6-10 weeks. Over the past day or two however, she has had SOB, cough, congestion, worsening BLE swelling. Cough is productive. Nothing makes symptoms better or worse. No CP, no abd pain, no fevers. NH staff reports low heart rate. EKG shows complete heart block with ventricular rate in the low 40s. CXR shows mild pulmonary edema. BMP shows creat of 1.5 up from baseline of 1.0. WBC shows 31k; however, she does have chronic leukocytosis and has never had a  WBC that was in the normal range going back to 2011 on our records. Recent chest x-ray revealed vascular congestion and mild cardiomegaly. Increased interstitial markings raise concern for mild interstitial edema.  Type of Study: Bedside Swallow Evaluation Previous Swallow Assessment:  (None in chart) Diet Prior to this Study: NPO Temperature Spikes Noted: No Respiratory Status: Nasal cannula (6 liters) History of Recent Intubation: No Behavior/Cognition: Alert;Cooperative;Pleasant mood Oral Cavity Assessment: Within Functional Limits Oral Care Completed by SLP: Recent completion by staff Oral Cavity - Dentition: Missing dentition Vision:  (N/A) Self-Feeding Abilities:  (N/A) Patient Positioning: Upright in bed Baseline Vocal Quality: Low vocal intensity Volitional Cough: Weak Volitional Swallow: Able to elicit    Oral/Motor/Sensory Function Overall Oral Motor/Sensory Function: Generalized oral weakness   Ice Chips Ice chips: Impaired Presentation: Spoon Oral Phase Impairments: Reduced lingual movement/coordination Pharyngeal Phase Impairments: Suspected delayed Swallow;Decreased hyoid-laryngeal movement;Wet Vocal Quality;Change in Vital Signs;Cough - Delayed (developed audible rasp while  consuming)   Thin Liquid Thin Liquid: Not tested    Nectar Thick Nectar Thick Liquid: Not tested   Honey Thick Honey Thick Liquid: Not tested   Puree Puree: Not tested   Solid   GO   Solid: Not tested       Mckinsey Keagle B. Rutherford Nail M.S., [place] 07/17/2016,3:00 PM

## 2016-07-17 NOTE — Progress Notes (Signed)
Palliative Medicine consult noted. Due to high referral volume, there may be a delay seeing this patient. Please call the Palliative Medicine Team office at [phone] if recommendations are needed in the interim.  Thank you for inviting [place] to see this patient.  Marjie Skiff Zabdi Mis, RN, BSN, [facility] 07/17/2016 9:17 AM Cell [phone] 8:00-4:00 Monday-Friday Office [phone]

## 2016-07-17 NOTE — Progress Notes (Signed)
CRITICAL VALUE ALERT  Critical value received:  POTASSIUM 2.7  Date of notification:  07/17/16  Time of notification:  0925  Critical value read back:Yes.    Nurse who received alert:  Harlan Stains RN  MD notified (1st page):  DR. Broadus John  Time of first page:  0925   Responding MD:  Broadus John  Time MD responded:  [phone]  SEE ORDERS.

## 2016-07-17 NOTE — Progress Notes (Signed)
Patient ID: EMREY BERO, female   DOB: 06-30-1915, 83 y.o.   MRN: [id number]     SUBJECTIVE: HR in 60s this morning.  BP stable to elevated.  Mild dyspnea.    Scheduled Meds: . aspirin  81 mg Oral Daily  . enoxaparin (LOVENOX) injection  20 mg Subcutaneous Q24H  . feeding supplement (ENSURE ENLIVE)  237 mL Oral TID BM  . feeding supplement (PRO-STAT SUGAR FREE 64)  30 mL Oral Daily  . furosemide  20 mg Intravenous Q12H  . mirtazapine  15 mg Oral QHS  . pantoprazole  40 mg Oral Daily  . piperacillin-tazobactam (ZOSYN)  IV  2.25 g Intravenous Q8H  . psyllium  1 packet Oral Daily  . sertraline  25 mg Oral Daily  . sodium chloride flush  3 mL Intravenous Q12H   Continuous Infusions: PRN Meds:.acetaminophen, albuterol, benzocaine, docusate sodium, guaiFENesin, hydrocortisone, levalbuterol, phenol, polyethylene glycol, senna-docusate  Vitals:   07/17/16 0000 07/17/16 0400 07/17/16 0455 07/17/16 0740  BP: (!) 177/52   (!) 182/54  Pulse: (!) 59   (!) 54  Resp: (!) 38   (!) 25  Temp:  97.6 F (36.4 C)  98.4 F (36.9 C)  TempSrc:  Oral  Axillary  SpO2: (!) 88%  (!) 86% 99%  Weight:      Height:        Intake/Output Summary (Last 24 hours) at 07/17/16 0859 Last data filed at 07/17/16 0600  Gross per 24 hour  Intake              150 ml  Output              300 ml  Net             -150 ml   LABS: Basic Metabolic Panel:  Recent Labs  07/25/2016 2332  NA 140  K 3.9  CL 99*  CO2 22  GLUCOSE 176*  BUN 45*  CREATININE 1.54*  CALCIUM 9.4    Recent Labs  07/10/2016 2332 07/17/16 0730  WBC 31.3* 19.8*  NEUTROABS 28.5*  --   HGB 12.7 11.8*  HCT 39.0 37.0  MCV 89.2 90.5  PLT 431* 438*   Dg Chest Port 1 View  Result Date: 07/16/2016 CLINICAL DATA:  Acute onset of productive cough and shortness of breath. Initial encounter. EXAM: PORTABLE CHEST 1 VIEW COMPARISON:  Chest radiograph from 08/09/2015 FINDINGS: The lungs are well-aerated. Vascular congestion is noted. Increased  interstitial markings raise concern for mild interstitial edema. There is no evidence of pleural effusion or pneumothorax. The cardiomediastinal silhouette is mildly enlarged. No acute osseous abnormalities are seen. External pacing pads are noted. IMPRESSION: Vascular congestion and mild cardiomegaly. Increased interstitial markings raise concern for mild interstitial edema. Electronically Signed   By: Garald Balding M.D.   On: 07/16/2016 00:16   PHYSICAL EXAM General: NAD, cachectic Neck: JVP 8-9 cm, no thyromegaly or thyroid nodule.  Lungs: Rhonchi bilaterally. CV: Nondisplaced PMI.  Heart regular S1/S2, no S3/S4, no murmur.  No peripheral edema.   Abdomen: Soft, nontender, no hepatosplenomegaly, no distention.  Neurologic: Alert and oriented x 3.  Psych: Normal affect. Extremities: No clubbing or cyanosis.   TELEMETRY: Reviewed telemetry pt in HR 50s, underlying rhythm difficult to tell with wavy baseline    ASSESSMENT AND PLAN:  83 yo with history of COPD, malnutrition with cachexia, HFpEF presented with dyspnea and was found to have complete heart block.  1. Complete heart block: HR 40s last  night with preserved BP.  Now up to 50s but hard to discern rhythm given wavy baseline on telemetry.  She has not been on beta blocker or CCB at home.  BP stable.  - ECG today for rhythm. - She does not want a pacemaker.  This is a reasonable decision given comorbities.  Plan for palliative care involvement, she is now DNR.  2. COPD: Seems quite significant by exam.  3. Acute on chronic presumed diastolic CHF: Mild JVD, mild elevation in BNP.  Given dyspnea, think trial of gentle diuresis is reasonable, I would switch to lasix 20 mg PO daily.  Follow creatinine closely.   Cardiology will sign off given plans for palliative care.  Please call with questions.   Ena Dawley 07/17/2016 8:59 AM

## 2016-07-17 NOTE — Progress Notes (Signed)
Patient removed Nonrebreather and refused to put it back on. Patient desat to the 76s. Patient allowed RN to place 6L [place] instead.

## 2016-07-17 NOTE — Progress Notes (Addendum)
PROGRESS NOTE    Bianca Khan  [id number] DOB: [birth year] DOA: 07/14/2016 PCP: Hollace Kinnier, DO Brief Narrative:89/F with COPD/CHF/Severe malnutrition, very poor PO intake and ongoing failure to thrive admitted with Complete heart block and CHF exacerbation, has chronic leukocytosis with acute worsening too and AKI, cards following  Assessment & Plan:    Complete heart block (HCC) -Not on AV blocking meds -suspect intrinsic conduction system issue,  Last TSH was normal  -Seen by Cards, Not a pacemaker candidate with advanced age ( will be 89 this year), severe malnutrition and ongoing failure to thrive, called and d/w son Happy Balian yesterday and today regarding poor prognosis and possible impending demise -Palliative consulted, DNR  Acute on chronic systolic and diastolic CHF -Continue IV lasix  -have not repeated ECHO   Acute on Chronic Leukocytosis -improving, on IV Zosyn, etiology not clear, CXR negative for infection,  -remains afebrile, UA pending -continue empiric Zosyn for now   AKi -improving, monitor  Severe Malnutrition -now 40kg and continuing to lose weight, very poor Po intake prior to admission  Hypokalemia -replace  DVT prophylaxis:Lovenox Code Status:DNR Family Communication:no family at bedside, called and discussed poor prognosis with son Natalye Reome 1/14 and 1/15 Disposition Plan:I think she needs residential hospice   Consultants:   Cards  Procedures:  Subjective: More confused and dyspneic today  Objective: Vitals:   07/17/16 0000 07/17/16 0400 07/17/16 0455 07/17/16 0740  BP: (!) 177/52   (!) 182/54  Pulse: (!) 59   (!) 54  Resp: (!) 38   (!) 25  Temp:  97.6 F (36.4 C)  98.4 F (36.9 C)  TempSrc:  Oral  Axillary  SpO2: (!) 88%  (!) 86% 99%  Weight:      Height:        Intake/Output Summary (Last 24 hours) at 07/17/16 1015 Last data filed at 07/17/16 0600  Gross per 24 hour  Intake              150 ml  Output               300 ml  Net             -150 ml   Filed Weights   07/16/16 0550  Weight: 40 kg (88 lb 3.2 oz)    Examination:  General exam: Frail, elderly, confused with increased work of breathing this am Respiratory system: decreased BS and few ronchi ar bases Cardiovascular system: S1 & S2 heard, RRR. No pedal edema. Gastrointestinal system: Abdomen is nondistended, soft and nontender. Normal bowel sounds heard. Central nervous system: Confused, no localizing signs Extremities: trace edema  Skin: No rashes, lesions or ulcers Psychiatry: confused    Data Reviewed: I have personally reviewed following labs and imaging studies  CBC:  Recent Labs Lab 07/10/2016 2332 07/17/16 0730  WBC 31.3* 19.8*  NEUTROABS 28.5*  --   HGB 12.7 11.8*  HCT 39.0 37.0  MCV 89.2 90.5  PLT 431* [id number]*   Basic Metabolic Panel:  Recent Labs Lab 07/12/2016 2332 07/17/16 0730  NA 140 146*  K 3.9 2.7*  CL 99* 98*  CO2 22 33*  GLUCOSE 176* 152*  BUN 45* 39*  CREATININE 1.54* 1.19*  CALCIUM 9.4 9.3   GFR: Estimated Creatinine Clearance: 16.3 mL/min (by C-G formula based on SCr of 1.19 mg/dL (H)). Liver Function Tests: No results for input(s): AST, ALT, ALKPHOS, BILITOT, PROT, ALBUMIN in the last 168 hours. No results for input(s): LIPASE, AMYLASE  in the last 168 hours. No results for input(s): AMMONIA in the last 168 hours. Coagulation Profile: No results for input(s): INR, PROTIME in the last 168 hours. Cardiac Enzymes: No results for input(s): CKTOTAL, CKMB, CKMBINDEX, TROPONINI in the last 168 hours. BNP (last 3 results)  Recent Labs  08/09/15 1516  PROBNP 284.0*   HbA1C: No results for input(s): HGBA1C in the last 72 hours. CBG: No results for input(s): GLUCAP in the last 168 hours. Lipid Profile: No results for input(s): CHOL, HDL, LDLCALC, TRIG, CHOLHDL, LDLDIRECT in the last 72 hours. Thyroid Function Tests: No results for input(s): TSH, T4TOTAL, FREET4, T3FREE, THYROIDAB in the last  72 hours. Anemia Panel: No results for input(s): VITAMINB12, FOLATE, FERRITIN, TIBC, IRON, RETICCTPCT in the last 72 hours. Urine analysis:    Component Value Date/Time   COLORURINE YELLOW 03/23/2012 0636   APPEARANCEUR CLOUDY (A) 03/23/2012 0636   LABSPEC 1.024 03/23/2012 0636   PHURINE 7.5 03/23/2012 0636   GLUCOSEU NEGATIVE 03/23/2012 0636   HGBUR NEGATIVE 03/23/2012 0636   BILIRUBINUR NEGATIVE 03/23/2012 0636   KETONESUR NEGATIVE 03/23/2012 0636   PROTEINUR NEGATIVE 03/23/2012 0636   UROBILINOGEN 1.0 03/23/2012 0636   NITRITE NEGATIVE 03/23/2012 0636   LEUKOCYTESUR TRACE (A) 03/23/2012 0636   Sepsis Labs: @LABRCNTIP (procalcitonin:4,lacticidven:4)  ) Recent Results (from the past 240 hour(s))  MRSA PCR Screening     Status: None   Collection Time: 07/16/16  5:07 AM  Result Value Ref Range Status   MRSA by PCR NEGATIVE NEGATIVE Final    Comment:        The GeneXpert MRSA Assay (FDA approved for NASAL specimens only), is one component of a comprehensive MRSA colonization surveillance program. It is not intended to diagnose MRSA infection nor to guide or monitor treatment for MRSA infections.          Radiology Studies: Dg Chest Port 1 View  Result Date: 07/16/2016 CLINICAL DATA:  Acute onset of productive cough and shortness of breath. Initial encounter. EXAM: PORTABLE CHEST 1 VIEW COMPARISON:  Chest radiograph from 08/09/2015 FINDINGS: The lungs are well-aerated. Vascular congestion is noted. Increased interstitial markings raise concern for mild interstitial edema. There is no evidence of pleural effusion or pneumothorax. The cardiomediastinal silhouette is mildly enlarged. No acute osseous abnormalities are seen. External pacing pads are noted. IMPRESSION: Vascular congestion and mild cardiomegaly. Increased interstitial markings raise concern for mild interstitial edema. Electronically Signed   By: Garald Balding M.D.   On: 07/16/2016 00:16        Scheduled  Meds: . aspirin  81 mg Oral Daily  . enoxaparin (LOVENOX) injection  20 mg Subcutaneous Q24H  . feeding supplement (ENSURE ENLIVE)  237 mL Oral TID BM  . feeding supplement (PRO-STAT SUGAR FREE 64)  30 mL Oral Daily  . furosemide  20 mg Intravenous Q12H  . mirtazapine  15 mg Oral QHS  . pantoprazole  40 mg Oral Daily  . piperacillin-tazobactam (ZOSYN)  IV  2.25 g Intravenous Q8H  . potassium chloride (KCL MULTIRUN) 30 mEq in 265 mL IVPB  30 mEq Intravenous Q4H  . psyllium  1 packet Oral Daily  . sertraline  25 mg Oral Daily  . sodium chloride flush  3 mL Intravenous Q12H   Continuous Infusions:   LOS: 1 day    Time spent: 85min    Domenic Polite, MD [facility] Pager [phone]  If 7PM-7AM, please contact night-coverage [email or web address] Password [email or web address] 07/17/2016, 10:15 AM

## 2016-07-17 NOTE — Progress Notes (Signed)
Initial Nutrition Assessment  DOCUMENTATION CODES:   Severe malnutrition in context of chronic illness, Underweight  INTERVENTION:   -Continue Ensure Enlive po TID, each supplement provides 350 kcal and 20 grams of protein -Continue 30 ml Prostat daily, each supplement provides 100 kcals and 15 grams of protein -Magic Cup TID with all meals  NUTRITION DIAGNOSIS:   Malnutrition related to chronic illness as evidenced by severe depletion of muscle mass, severe depletion of body fat, energy intake < 75% for > or equal to 1 month.  GOAL:   Patient will meet greater than or equal to 90% of their needs  MONITOR:   PO intake, Supplement acceptance, Labs, Weight trends, Skin, I & O's  REASON FOR ASSESSMENT:   Consult Assessment of nutrition requirement/status  ASSESSMENT:   Bianca Khan is a 83 y.o. female with medical history significant of CHF, COPD, CAD, adult failure to thrive with ongoing weight loss, mild dementia.  At baseline the patient lives in a SNF, walks with a walker, has been having difficulty with weight loss over recent months and now only weighs 81 lbs today.  However, she is pretty "with it" despite her age, only having mild memory problems it appears and notes that before this past week her quality of life was "fairly decent".'  Pt admitted with complete heart block.   Case discussed with RN, who reports pt is at a high aspiration risk. Pt too lethargic to consume Ensure prior to RD visit and RN had to deep suction secretions.   Hx obtained from pt son at bedside, who reports pt has a poor appetite at baseline. He shares that the majority of pt's nutrition comes from supplements (Ensure and [employer]). He acknowledges that pt has had a general decline in health over the past 6 months, including pt eating progressively less and losing wt. He is unable to provide further details regarding wt loss, however, pt has been stable over the past year per [facility]. Pt has  always been a small-framed person; per son, pt weighed 120# at her highest wt.   Nutrition-Focused physical exam completed. Findings are severe fat depletion, severe muscle depletion, and no edema.   Palliative care team consult pending. Pt son has already declined pacemaker. Per MD notes, pt may need residential hospice.   Medications received and include remeron.   Labs reviewed: Na: 146, K: 2.7 (on IV supplementation).   Diet Order:  Diet regular Room service appropriate? Yes; Fluid consistency: Thin  Skin:  Reviewed, no issues  Last BM:  07/16/16  Height:   Ht Readings from Last 1 Encounters:  07/16/16 5' (1.524 m)    Weight:   Wt Readings from Last 1 Encounters:  07/16/16 88 lb 3.2 oz (40 kg)    Ideal Body Weight:  45.5 kg  BMI:  Body mass index is 17.23 kg/m.  Estimated Nutritional Needs:   Kcal:  1200-1400  Protein:  55-65 grams  Fluid:  1.2-1.4 L  EDUCATION NEEDS:   No education needs identified at this time  Dominie Benedick A. Jimmye Norman, RD, LDN, CDE Pager: [phone] After hours Pager: [phone]

## 2016-07-18 DIAGNOSIS — R0602 Shortness of breath: Secondary | ICD-10-CM

## 2016-07-18 DIAGNOSIS — Z7189 Other specified counseling: Secondary | ICD-10-CM

## 2016-07-18 LAB — BASIC METABOLIC PANEL
Anion gap: 18 — ABNORMAL HIGH (ref 5–15)
BUN: 56 mg/dL — AB (ref 6–20)
CHLORIDE: 103 mmol/L (ref 101–111)
CO2: 31 mmol/L (ref 22–32)
Calcium: 9.5 mg/dL (ref 8.9–10.3)
Creatinine, Ser: 1.66 mg/dL — ABNORMAL HIGH (ref 0.44–1.00)
GFR calc Af Amer: 28 mL/min — ABNORMAL LOW (ref >60)
GFR calc non Af Amer: 24 mL/min — ABNORMAL LOW (ref >60)
Glucose, Bld: 149 mg/dL — ABNORMAL HIGH (ref 65–99)
POTASSIUM: 3.4 mmol/L — AB (ref 3.5–5.1)
Sodium: 152 mmol/L — ABNORMAL HIGH (ref 135–145)

## 2016-07-18 LAB — URINALYSIS, ROUTINE W REFLEX MICROSCOPIC
BILIRUBIN URINE: NEGATIVE
GLUCOSE, UA: NEGATIVE mg/dL
KETONES UR: NEGATIVE mg/dL
LEUKOCYTES UA: NEGATIVE
NITRITE: NEGATIVE
PH: 5 (ref 5.0–8.0)
PROTEIN: 100 mg/dL — AB
Specific Gravity, Urine: 1.013 (ref 1.005–1.030)

## 2016-07-18 LAB — CREATININE, URINE, RANDOM: Creatinine, Urine: 69.31 mg/dL

## 2016-07-18 LAB — CBC
HEMATOCRIT: 43.1 % (ref 36.0–46.0)
HEMOGLOBIN: 12.7 g/dL (ref 12.0–15.0)
MCH: 27.9 pg (ref 26.0–34.0)
MCHC: 29.5 g/dL — ABNORMAL LOW (ref 30.0–36.0)
MCV: 94.5 fL (ref 78.0–100.0)
Platelets: 487 10*3/uL — ABNORMAL HIGH (ref 150–400)
RBC: 4.56 MIL/uL (ref 3.87–5.11)
RDW: 13.9 % (ref 11.5–15.5)
WBC: 18.9 10*3/uL — ABNORMAL HIGH (ref 4.0–10.5)

## 2016-07-18 MED ORDER — POLYVINYL ALCOHOL 1.4 % OP SOLN
1.0000 [drp] | Freq: Four times a day (QID) | OPHTHALMIC | Status: DC | PRN
  Filled 2016-07-18: qty 15

## 2016-07-18 MED ORDER — SODIUM CHLORIDE 0.9% FLUSH
3.0000 mL | Freq: Two times a day (BID) | INTRAVENOUS | Status: DC
Start: 2016-07-18 — End: 2016-07-20
  Administered 2016-07-18: 3 mL via INTRAVENOUS
  Administered 2016-07-19: 10 mL via INTRAVENOUS

## 2016-07-18 MED ORDER — MORPHINE SULFATE (PF) 2 MG/ML IV SOLN
1.0000 mg | INTRAVENOUS | Status: DC | PRN
  Administered 2016-07-18: 1 mg via INTRAVENOUS
  Filled 2016-07-18: qty 1

## 2016-07-18 MED ORDER — GLYCOPYRROLATE 1 MG PO TABS
1.0000 mg | ORAL_TABLET | ORAL | Status: DC | PRN
  Filled 2016-07-18: qty 1

## 2016-07-18 MED ORDER — SODIUM CHLORIDE 0.9 % IV SOLN: 250.0000 mL | INTRAVENOUS | Status: DC | PRN

## 2016-07-18 MED ORDER — GLYCOPYRROLATE 0.2 MG/ML IJ SOLN: 0.2000 mg | INTRAMUSCULAR | Status: DC | PRN

## 2016-07-18 MED ORDER — MORPHINE SULFATE (PF) 2 MG/ML IV SOLN
1.0000 mg | INTRAVENOUS | Status: DC | PRN
  Administered 2016-07-18 (×2): 2 mg via INTRAVENOUS
  Administered 2016-07-19: 1 mg via INTRAVENOUS
  Administered 2016-07-19 (×2): 2 mg via INTRAVENOUS
  Filled 2016-07-18 (×5): qty 1

## 2016-07-18 MED ORDER — MORPHINE SULFATE (PF) 2 MG/ML IV SOLN: 1.0000 mg | INTRAVENOUS | Status: DC | PRN

## 2016-07-18 MED ORDER — SODIUM CHLORIDE 0.9% FLUSH: 3.0000 mL | INTRAVENOUS | Status: DC | PRN

## 2016-07-18 MED ORDER — LORAZEPAM 2 MG/ML IJ SOLN: 0.2500 mg | INTRAMUSCULAR | Status: DC | PRN

## 2016-07-18 MED ORDER — BIOTENE DRY MOUTH MT LIQD: 15.0000 mL | OROMUCOSAL | Status: DC | PRN

## 2016-07-18 NOTE — Progress Notes (Signed)
Visited with patient as a result of a spiritual Care consult.  Patient is palliative care.Patient was alert and said she felt better today than yesterday. Patient talked about her [place]. [employer].  I prayed with patient and listened to her.  Chaplain available as needed.   07/18/16 1639  Clinical Encounter Type  Visited With Patient;Health care provider  Visit Type Initial;Spiritual support  Consult/Referral To (spiritual care consult)  Spiritual Encounters  Spiritual Needs Prayer;Emotional  Jaclynn Major, [place]

## 2016-07-18 NOTE — Progress Notes (Signed)
PROGRESS NOTE    Bianca Khan  [id number] DOB: 1915-03-11 DOA: 07/16/2016 PCP: Hollace Kinnier, DO Brief Narrative:89/F with COPD/CHF/Severe malnutrition, very poor PO intake and ongoing failure to thrive admitted with Complete heart block and CHF exacerbation, has chronic leukocytosis with acute worsening too and AKI, cards following  Assessment & Plan:    Complete heart block (HCC) -Not on AV blocking meds -suspect intrinsic conduction system issue,  Last TSH was normal  -Seen by Cards, Not a pacemaker candidate with advanced age ( will be 89 this year), severe malnutrition and ongoing failure to thrive, called and d/w son Bianca Khan 1/14 and 1/16 regarding poor prognosis and possible impending demise, DNR -Palliative consulting  Acute on chronic systolic and diastolic CHF -hold IV lasix now since Na climbing -have not repeated ECHO   Acute on Chronic Leukocytosis -improving, on IV Zosyn, etiology not clear, CXR negative for infection,  -remains afebrile, UA pending -On empiric Zosyn`   AKI -Creatinine climbing, will hold lasix  Severe Malnutrition -now 40kg and continuing to lose weight, very poor Po intake prior to admission  Hypokalemia -replaced  DVT prophylaxis:Lovenox Code Status:DNR Family Communication:no family at bedside, called and discussed poor prognosis with son Bianca Khan 1/14 and 1/15 Disposition Plan:Dispo per Palliative   Consultants:   Cards  Procedures:  Subjective: -more alert today  Objective: Vitals:   07/17/16 2000 07/17/16 2334 07/18/16 0321 07/18/16 0826  BP: (!) 179/75 (!) 159/57 (!) 139/99 (!) 156/65  Pulse: 61 (!) 50 (!) 44 (!) 48  Resp: (!) 28 (!) 32 (!) 21 (!) 30  Temp:  97.4 F (36.3 C) 97.6 F (36.4 C) (!) 96.7 F (35.9 C)  TempSrc:  Oral Axillary Axillary  SpO2: (!) 78% 96% 98% 99%  Weight:      Height:        Intake/Output Summary (Last 24 hours) at 07/18/16 1231 Last data filed at 07/18/16 [id number]  Gross per 24  hour  Intake              415 ml  Output              100 ml  Net              315 ml   Filed Weights   07/16/16 0550  Weight: 40 kg (88 lb 3.2 oz)    Examination:  General exam: Frail, elderly, more alert this am Respiratory system: decreased BS and few ronchi ar bases Cardiovascular system: S1 & S2 heard, RRR. No pedal edema. Gastrointestinal system: Abdomen is nondistended, soft and nontender. Normal bowel sounds heard. Central nervous system: Confused, no localizing signs Extremities: trace edema  Skin: No rashes, lesions or ulcers Psychiatry: confused  Data Reviewed: I have personally reviewed following labs and imaging studies  CBC:  Recent Labs Lab 07/09/2016 2332 07/17/16 0730 07/18/16 1012  WBC 31.3* 19.8* 18.9*  NEUTROABS 28.5*  --   --   HGB 12.7 11.8* 12.7  HCT 39.0 37.0 43.1  MCV 89.2 90.5 94.5  PLT 431* 438* 0000000*   Basic Metabolic Panel:  Recent Labs Lab 07/29/2016 2332 07/17/16 0730 07/18/16 1012  NA 140 146* 152*  K 3.9 2.7* 3.4*  CL 99* 98* 103  CO2 22 33* 31  GLUCOSE 176* 152* 149*  BUN 45* 39* 56*  CREATININE 1.54* 1.19* 1.66*  CALCIUM 9.4 9.3 9.5   GFR: Estimated Creatinine Clearance: 11.7 mL/min (by C-G formula based on SCr of 1.66 mg/dL (H)). Liver Function  Tests: No results for input(s): AST, ALT, ALKPHOS, BILITOT, PROT, ALBUMIN in the last 168 hours. No results for input(s): LIPASE, AMYLASE in the last 168 hours. No results for input(s): AMMONIA in the last 168 hours. Coagulation Profile: No results for input(s): INR, PROTIME in the last 168 hours. Cardiac Enzymes: No results for input(s): CKTOTAL, CKMB, CKMBINDEX, TROPONINI in the last 168 hours. BNP (last 3 results)  Recent Labs  08/09/15 1516  PROBNP 284.0*   HbA1C: No results for input(s): HGBA1C in the last 72 hours. CBG: No results for input(s): GLUCAP in the last 168 hours. Lipid Profile: No results for input(s): CHOL, HDL, LDLCALC, TRIG, CHOLHDL, LDLDIRECT in the  last 72 hours. Thyroid Function Tests: No results for input(s): TSH, T4TOTAL, FREET4, T3FREE, THYROIDAB in the last 72 hours. Anemia Panel: No results for input(s): VITAMINB12, FOLATE, FERRITIN, TIBC, IRON, RETICCTPCT in the last 72 hours. Urine analysis:    Component Value Date/Time   COLORURINE YELLOW 07/16/2016 0320   APPEARANCEUR CLOUDY (A) 07/16/2016 0320   LABSPEC 1.013 07/16/2016 0320   PHURINE 5.0 07/16/2016 0320   GLUCOSEU NEGATIVE 07/16/2016 0320   HGBUR SMALL (A) 07/16/2016 0320   BILIRUBINUR NEGATIVE 07/16/2016 0320   KETONESUR NEGATIVE 07/16/2016 0320   PROTEINUR 100 (A) 07/16/2016 0320   UROBILINOGEN 1.0 03/23/2012 0636   NITRITE NEGATIVE 07/16/2016 0320   LEUKOCYTESUR NEGATIVE 07/16/2016 0320   Sepsis Labs: @LABRCNTIP (procalcitonin:4,lacticidven:4)  ) Recent Results (from the past 240 hour(s))  Blood culture (routine x 2)     Status: None (Preliminary result)   Collection Time: 07/16/16 12:30 AM  Result Value Ref Range Status   Specimen Description BLOOD RIGHT FOREARM  Final   Special Requests BOTTLES DRAWN AEROBIC ONLY 5CC  Final   Culture NO GROWTH 1 DAY  Final   Report Status PENDING  Incomplete  Blood culture (routine x 2)     Status: None (Preliminary result)   Collection Time: 07/16/16 12:47 AM  Result Value Ref Range Status   Specimen Description BLOOD RIGHT FOREARM  Final   Special Requests BOTTLES DRAWN AEROBIC ONLY 5CC  Final   Culture NO GROWTH 1 DAY  Final   Report Status PENDING  Incomplete  MRSA PCR Screening     Status: None   Collection Time: 07/16/16  5:07 AM  Result Value Ref Range Status   MRSA by PCR NEGATIVE NEGATIVE Final    Comment:        The GeneXpert MRSA Assay (FDA approved for NASAL specimens only), is one component of a comprehensive MRSA colonization surveillance program. It is not intended to diagnose MRSA infection nor to guide or monitor treatment for MRSA infections.          Radiology Studies: No results  found.      Scheduled Meds: . aspirin  81 mg Oral Daily  . enoxaparin (LOVENOX) injection  20 mg Subcutaneous Q24H  . feeding supplement (ENSURE ENLIVE)  237 mL Oral TID BM  . feeding supplement (PRO-STAT SUGAR FREE 64)  30 mL Oral Daily  . furosemide  20 mg Intravenous Q12H  . mirtazapine  15 mg Oral QHS  . pantoprazole  40 mg Oral Daily  . piperacillin-tazobactam (ZOSYN)  IV  2.25 g Intravenous Q8H  . psyllium  1 packet Oral Daily  . sertraline  25 mg Oral Daily  . sodium chloride flush  3 mL Intravenous Q12H   Continuous Infusions:   LOS: 2 days    Time spent: 66min  Domenic Polite, MD [facility] Pager [phone]  If 7PM-7AM, please contact night-coverage [email or web address] Password [facility] 07/18/2016, 12:31 PM

## 2016-07-18 NOTE — Consult Note (Signed)
Consultation Note Date: 07/18/2016   Patient Name: Bianca Khan  DOB: [birth year]  MRN: [id number]  Age / Sex: 83 y.o., female  PCP: Gayland Curry, DO Referring Physician: Domenic Polite, MD  Reason for Consultation: Establishing goals of care  HPI/Patient Profile: 82 y.o. female  with past medical history of DM II, coronary artery disease and MI, atrial fibrillation, chronic leukocytosis, and pleural effusion/with pulmonary nodule, and severe malnutrition, who was admitted on 07/07/2016 with shortness of breath.  She was found to be in complete heart block with a pulse rate 40.  Further, she had developed acute renal failure due to poor perfusion.  She has been evaluated by cardiology and found not to be a candidate for pace maker placement.   Clinical Assessment and Goals of Care:  I have reviewed medical records including notes, imaging, and labs; received report from the attending MD, assessed the patient and then met at the bedside along with her two sons Marlou Sa and [place]) to discuss diagnosis prognosis, GOC, EOL wishes, disposition and options.  I introduced Palliative Medicine as specialized medical care for people living with serious illness. It focuses on providing relief from the symptoms and stress of a serious illness. The goal is to improve quality of life for both the patient and the family.  We discussed a brief life review of the patient.  Then I assessed functional and nutritional status at home by gathering history. We discussed their current illness and what in means in the larger context of their on-going co-morbidities.  Natural disease trajectory and expectations at EOL were discussed.  The sons have a priority of comfort for their mother.  They understand that nothing can be done medically to significantly improve her functionality.  We discussed minimizing medications and interventions that are  not related to comfort.  Hospice and Palliative Care services were explained and offered.  The sons are accepting of residential hospice services if their mother can transport comfortably.   The family was encouraged to call with questions or concerns.  PMT will continue to support holistically.   Primary Decision Maker:  NEXT OF KIN son Marlou Sa    SUMMARY OF RECOMMENDATIONS    Code Status/Advance Care Planning:  DNR    Symptom Management:   Morphine q 15 prn. For pain or dyspnea  Ativan q 4 prn for agitation  Medications and interventions not related to comfort were discontinued.   Additional Recommendations (Limitations, Scope, Preferences):  Full Comfort Care   Will place order to transfer to 6N  Palliative Prophylaxis:   Aspiration, Frequent Pain Assessment and Oral Care  Psycho-social/Spiritual:   Desire for further Chaplaincy support:  yes  Prognosis:   < 2 weeks - secondary to complete heart block, respiratory failure and pneumonia.  Discharge Planning: Hospice facility      Primary Diagnoses: Present on Admission: . Complete heart block ([place]) . Acute combined systolic and diastolic congestive heart failure ([place]) . Chronic Leukocytosis . Cardiorenal syndrome   I have reviewed the medical record,  interviewed the patient and family, and examined the patient. The following aspects are pertinent.  Past Medical History:  Diagnosis Date  . Abnormal involuntary movements(781.0)   . Abnormality of gait   . Acid reflux   . Acute bronchitis   . Adult failure to thrive   . Anemia   . Anxiety   . Anxiety state, unspecified   . Atrial fibrillation ([place])   . Carbuncle and furuncle of other specified sites   . Cardiomegaly   . CHF (congestive heart failure) ([place])   . Chronic airway obstruction, not elsewhere classified   . COPD (chronic obstructive pulmonary disease) ([place])   . Coronary atherosclerosis of native coronary artery   . Depressive disorder,  not elsewhere classified   . Diaphragmatic hernia without mention of obstruction or gangrene   . Duodenal ulcer, unspecified as acute or chronic, without hemorrhage, perforation, or obstruction   . Edema   . Edema   . Esophagitis, unspecified   . Hypertension   . Impacted cerumen   . Insomnia, unspecified   . Insomnia, unspecified   . Leukocytosis   . Leukocytosis, unspecified   . Loss of weight   . Loss of weight   . Myocardial infarction   . Open wound of scalp, without mention of complication   . Osteoporosis   . Other abnormal blood chemistry   . Other dyspnea and respiratory abnormality   . Personal history of methicillin resistant Staphylococcus aureus   . Reflux esophagitis   . Senile osteoporosis   . Sinus drainage   . Solitary pulmonary nodule   . Type II or unspecified type diabetes mellitus without mention of complication, not stated as uncontrolled   . Unspecified constipation   . Unspecified essential hypertension   . Unspecified fall   . Unspecified intestinal obstruction   . Unspecified pleural effusion   . Unspecified pleural effusion   . Weakness    Social History   Social History  . Marital status: Widowed    Spouse name: N/A  . Number of children: N/A  . Years of education: N/A   Social History Main Topics  . Smoking status: Never Smoker  . Smokeless tobacco: Never Used  . Alcohol use No     Comment: rarely  . Drug use: No  . Sexual activity: No     Comment: unknown   Other Topics Concern  . None   Social History Narrative  . None   Family History  Problem Relation Age of Onset  . Stroke Father    Scheduled Meds: . aspirin  81 mg Oral Daily  . enoxaparin (LOVENOX) injection  20 mg Subcutaneous Q24H  . feeding supplement (ENSURE ENLIVE)  237 mL Oral TID BM  . feeding supplement (PRO-STAT SUGAR FREE 64)  30 mL Oral Daily  . furosemide  20 mg Intravenous Q12H  . mirtazapine  15 mg Oral QHS  . pantoprazole  40 mg Oral Daily  .  piperacillin-tazobactam (ZOSYN)  IV  2.25 g Intravenous Q8H  . psyllium  1 packet Oral Daily  . sertraline  25 mg Oral Daily  . sodium chloride flush  3 mL Intravenous Q12H   Continuous Infusions: PRN Meds:.acetaminophen, albuterol, benzocaine, docusate sodium, guaiFENesin, hydrocortisone, levalbuterol, morphine injection, phenol, polyethylene glycol, senna-docusate No Known Allergies Review of Systems patient SOB.  Extensive ROS not collected, but patient denied pain.  Physical Exam  Frail, elderly, cachectically thin, O2 at 6L placed in mouth Alert, orientated, mildly confused Resp:  Increased work of breathing with coarse bs and rals   Vital Signs: BP (!) 156/65 (BP Location: Right Arm)   Pulse (!) 48   Temp (!) 96.7 F (35.9 C) (Axillary)   Resp (!) 30   Ht 5' (1.524 m)   Wt 40 kg (88 lb 3.2 oz)   SpO2 99%   BMI 17.23 kg/m  Pain Assessment: No/denies pain POSS *See Group Information*: S-Acceptable,Sleep, easy to arouse Pain Score: 0-No pain   SpO2: SpO2: 99 % O2 Device:SpO2: 99 % O2 Flow Rate: .O2 Flow Rate (L/min): 6 L/min  IO: Intake/output summary:  Intake/Output Summary (Last 24 hours) at 07/18/16 1009 Last data filed at 07/18/16 2162  Gross per 24 hour  Intake              415 ml  Output              100 ml  Net              315 ml    LBM: Last BM Date: 07/16/16 Baseline Weight: Weight: 40 kg (88 lb 3.2 oz) Most recent weight: Weight: 40 kg (88 lb 3.2 oz)     Palliative Assessment/Data:   Flowsheet Rows   Flowsheet Row Most Recent Value  Intake Tab  Referral Department  Critical care  Unit at Time of Referral  Med/Surg Unit  Palliative Care Primary Diagnosis  Cardiac  Date Notified  07/16/16  Palliative Care Type  New Palliative care  Reason for referral  Clarify Goals of Care  Date of Admission  07/30/2016  Date first seen by Palliative Care  07/18/16  # of days Palliative referral response time  2 Day(s)  # of days IP prior to Palliative referral   1  Clinical Assessment  Palliative Performance Scale Score  30%  Dyspnea Max Last 24 Hours  7  Dyspnea Min Last 24 hours  4  Psychosocial & Spiritual Assessment  Palliative Care Outcomes  Patient/Family meeting held?  Yes  Who was at the meeting?  patient  Palliative Care Outcomes  Clarified goals of care      Total time spent 70 min. Greater than 50%  of this time was spent counseling and coordinating care related to the above assessment and plan.  Signed by: Imogene Burn, PA-C Palliative Medicine Pager: [phone]  Please contact Palliative Medicine Team phone at [phone] for questions and concerns.  For individual provider: See Shea Evans

## 2016-07-18 NOTE — Progress Notes (Signed)
Talked with Md about pt's work of breathing, o2 sats decreasing, and having to frequent turn and clean for incont. Of urine episodes.   Pt uncomfortable.  New orders received.  Will continue to monitor.  Saunders Revel T

## 2016-07-18 NOTE — Progress Notes (Signed)
Speech Language Pathology Treatment: Dysphagia  Patient Details Name: Bianca Khan MRN: [id number] DOB: [birth year] Today's Date: 07/18/2016 Time: [id number] SLP Time Calculation (min) (ACUTE ONLY): 25 min  Assessment / Plan / Recommendation Clinical Impression  Skilled treatment session focused on dysphagia goals. SLP facilitated session by providing trials of ice chips and thin liquids via spoon. Pt with improved ability this session. Pt able to maintain O2 stats during consumption and appeared to demonstrate more timely swallow initiation (as felt by palpation to neck). Pt didn't demonstrate any overt s/s of aspiration during consumption. Pt continues to be very weak and was not able to attempt food textures d/t fatigue. Therefore would recommend full thin liquid diet BY SPOON ONLY following strict aspiration precautions.    HPI HPI: [name] a 83 y.o.femalewith medical history significant of CHF, COPD, CAD, adult failure to thrive with ongoing weight loss, mild dementia. At baseline the patient lives in a SNF, walks with a walker, has been having difficulty with weight loss over recent months and now only weighs 81 lbs today. However, she is pretty "with it" despite her age, only having mild memory problems it appears and notes that before this past week her quality of life was "fairly decent". She has had mild DOE over the past 6-10 weeks. Over the past day or two however, she has had SOB, cough, congestion, worsening BLE swelling. Cough is productive. Nothing makes symptoms better or worse. No CP, no abd pain, no fevers. NH staff reports low heart rate. EKG shows complete heart block with ventricular rate in the low 40s. CXR shows mild pulmonary edema. BMP shows creat of 1.5 up from baseline of 1.0. WBC shows 31k; however, she does have chronic leukocytosis and has never had a WBC that was in the normal range going back to 2011 on our records. Recent chest x-ray revealed vascular  congestion and mild cardiomegaly. Increased interstitial markings raise concern for mild interstitial edema.       SLP Plan  Continue with current plan of care     Recommendations  Diet recommendations: Thin liquid (Full liquid diet) Liquids provided via: Teaspoon Medication Administration: Crushed with puree (As tolerated) Supervision: Full supervision/cueing for compensatory strategies;Staff to assist with self feeding Compensations: Minimize environmental distractions;Slow rate;Small sips/bites (BY SPOON ONLY) Postural Changes and/or Swallow Maneuvers: Seated upright 90 degrees                Oral Care Recommendations: Oral care QID Follow up Recommendations: Skilled Nursing facility Plan: Continue with current plan of care       GO             Travaughn Vue B. Rutherford Nail, M.S., CCC-SLP Speech-Language Pathologist [phone]   Erlean Mealor 07/18/2016, 9:15 AM

## 2016-07-18 NOTE — Progress Notes (Signed)
CSW received referral for residential hospice placement. CSW sent referral to [place], and [facility].  Bianca Khan LCSWA [phone]

## 2016-07-19 LAB — URINE CULTURE: CULTURE: NO GROWTH

## 2016-07-19 MED ORDER — GLYCOPYRROLATE 1 MG PO TABS: 1.0000 mg | ORAL_TABLET | ORAL | 0 refills | Status: AC | PRN

## 2016-07-19 MED ORDER — MORPHINE SULFATE (CONCENTRATE) 10 MG /0.5 ML PO SOLN: 5.0000 mg | ORAL | 0 refills | Status: AC | PRN

## 2016-07-19 MED ORDER — ACETAMINOPHEN 650 MG RE SUPP
650.0000 mg | Freq: Four times a day (QID) | RECTAL | Status: DC | PRN
  Administered 2016-07-19 – 2016-07-20 (×2): 650 mg via RECTAL
  Filled 2016-07-19 (×2): qty 1

## 2016-07-19 NOTE — Discharge Summary (Signed)
Physician Discharge Summary  Bianca Khan [id number] DOB: August 27, 1914 DOA: 08/02/2016  PCP: Bianca Kinnier, DO  Admit date: 07/03/2016 Discharge date: 07/19/2016  Time spent: 35 minutes  Recommendations for Outpatient Follow-up:  Residential Hospice for Comfort/End of life care  Discharge Diagnoses:  Principal Problem:   Complete heart block ([place]) Active Problems:   Chronic Leukocytosis   Acute combined systolic and diastolic congestive heart failure (HCC)   Cardiorenal syndrome   Protein-calorie malnutrition, severe   Goals of care, counseling/discussion   Encounter for hospice care discussion   Discharge Condition: poor  Diet recommendation: comfort feeds  Filed Weights   07/16/16 0550 07/18/16 2150  Weight: 40 kg (88 lb 3.2 oz) 35.4 kg (78 lb 1.6 oz)    History of present illness:  89/F with COPD/CHF/Severe malnutrition, very poor PO intake and ongoing failure to thrive admitted with Complete heart block and CHF exacerbation, has chronic leukocytosis with acute worsening too and AKI  Hospital Course:  Complete heart block (HCC) -Not on AV blocking meds -suspect intrinsic conduction system issue,  Last TSH was normal  -Seen by Cards, Not a pacemaker candidate with advanced age ( will be 89 this year), severe malnutrition and ongoing failure to thrive, called and d/w son Bianca Khan 1/14 and 1/16 regarding poor prognosis and possible impending demise, DNR -Palliative consulted, s/p Family meeting, decision made for Residential Hospice for End of life care  Acute on chronic systolic and diastolic CHF -holding IV lasix now since Na climbing -have not repeated ECHO -now comfort care   Acute on Chronic Leukocytosis -was on IV Zosyn, etiology not clear, CXR negative for infection,  -now comfort care   AKI -baseline was 1.0 -Creatinine climbing, held lasix yesterday  Severe Malnutrition -now 40kg and continuing to lose weight, very poor Po intake prior to  admission  Hypokalemia -replaced, now comfort care   Consultations:  Cardiology  Palliative care  Discharge Exam: Vitals:   07/18/16 2150 07/19/16 0424  BP: 131/62 (!) 126/47  Pulse: (!) 50 (!) 54  Resp: 18 (!) 22  Temp: 98.5 F (36.9 C) 97.5 F (36.4 C)    General: somnolent, drowsy, cachectic Cardiovascular: S1S2/tachycardic Respiratory: ronchi at bases  Discharge Instructions    Current Discharge Medication List    START taking these medications   Details  glycopyrrolate (ROBINUL) 1 MG tablet Take 1 tablet (1 mg total) by mouth every 4 (four) hours as needed (excessive secretions). Qty: 5 tablet, Refills: 0    Morphine Sulfate (MORPHINE CONCENTRATE) 10 mg / 0.5 ml concentrated solution Take 0.25 mLs (5 mg total) by mouth every 2 (two) hours as needed for severe pain. Qty: 15 mL, Refills: 0      CONTINUE these medications which have NOT CHANGED   Details  acetaminophen (TYLENOL) 325 MG tablet Take 650 mg by mouth every 6 (six) hours as needed for mild pain.     albuterol (PROVENTIL HFA;VENTOLIN HFA) 108 (90 Base) MCG/ACT inhaler Inhale 2 puffs into the lungs every 6 (six) hours as needed for wheezing or shortness of breath.    aspirin 81 MG chewable tablet Chew 81 mg by mouth daily.    benzocaine (ORAJEL) 10 % mucosal gel Use as directed 1 application in the mouth or throat 4 (four) times daily as needed for mouth pain.    docusate sodium (COLACE) 100 MG capsule Take 100 mg by mouth daily as needed for mild constipation.     hydrocortisone (ANUSOL-HC) 25 MG suppository Place 25  mg rectally daily as needed (for itching caused my hemorrhoids).     mirtazapine (REMERON) 15 MG tablet TAKE 1 TABLET AT BEDTIME Qty: 90 tablet, Refills: 0    Nutritional Supplements (NUTRITIONAL SHAKE) LIQD Take 1 Can by mouth 3 (three) times daily between meals. *[employer] Strawberry Shake*    omeprazole (PRILOSEC) 20 MG capsule TAKE 1 CAPSULE DAILY TO REDUCE STOMACH ACID  AND TO HELP PROTECT ESOPHAGUS Qty: 90 capsule, Refills: 0    polyethylene glycol (MIRALAX / GLYCOLAX) packet Take 17 g by mouth daily as needed (for constipation).     sertraline (ZOLOFT) 25 MG tablet Take 1 tablet (25 mg total) by mouth daily. Qty: 30 tablet, Refills: 3   Associated Diagnoses: Anxiety state    Skin Protectants, Misc. (BAZA PROTECT EX) Apply topically daily as needed (to red areas on buttocks).      STOP taking these medications     Cholecalciferol (VITAMIN D3) 2000 units capsule      guaiFENesin (MUCINEX) 600 MG 12 hr tablet      hydrochlorothiazide (HYDRODIURIL) 25 MG tablet      levalbuterol (XOPENEX) 0.63 MG/3ML nebulizer solution      phenol (CHLORASEPTIC) 1.4 % LIQD      potassium chloride 20 MEQ/15ML (10%) SOLN      protein supplement (RESOURCE BENEPROTEIN) POWD      psyllium (METAMUCIL) 58.6 % powder      senna-docusate (SENEXON-S) 8.6-50 MG tablet      trolamine salicylate (ASPERCREME) 10 % cream        No Known Allergies    The results of significant diagnostics from this hospitalization (including imaging, microbiology, ancillary and laboratory) are listed below for reference.    Significant Diagnostic Studies: Dg Chest Port 1 View  Result Date: 07/16/2016 CLINICAL DATA:  Acute onset of productive cough and shortness of breath. Initial encounter. EXAM: PORTABLE CHEST 1 VIEW COMPARISON:  Chest radiograph from 08/09/2015 FINDINGS: The lungs are well-aerated. Vascular congestion is noted. Increased interstitial markings raise concern for mild interstitial edema. There is no evidence of pleural effusion or pneumothorax. The cardiomediastinal silhouette is mildly enlarged. No acute osseous abnormalities are seen. External pacing pads are noted. IMPRESSION: Vascular congestion and mild cardiomegaly. Increased interstitial markings raise concern for mild interstitial edema. Electronically Signed   By: Garald Balding M.D.   On: 07/16/2016 00:16     Microbiology: Recent Results (from the past 240 hour(s))  Blood culture (routine x 2)     Status: None (Preliminary result)   Collection Time: 07/16/16 12:30 AM  Result Value Ref Range Status   Specimen Description BLOOD RIGHT FOREARM  Final   Special Requests BOTTLES DRAWN AEROBIC ONLY 5CC  Final   Culture NO GROWTH 2 DAYS  Final   Report Status PENDING  Incomplete  Blood culture (routine x 2)     Status: None (Preliminary result)   Collection Time: 07/16/16 12:47 AM  Result Value Ref Range Status   Specimen Description BLOOD RIGHT FOREARM  Final   Special Requests BOTTLES DRAWN AEROBIC ONLY 5CC  Final   Culture NO GROWTH 2 DAYS  Final   Report Status PENDING  Incomplete  Urine culture     Status: None   Collection Time: 07/16/16  3:20 AM  Result Value Ref Range Status   Specimen Description URINE, CATHETERIZED  Final   Special Requests NONE  Final   Culture NO GROWTH  Final   Report Status 07/19/2016 FINAL  Final  MRSA PCR Screening  Status: None   Collection Time: 07/16/16  5:07 AM  Result Value Ref Range Status   MRSA by PCR NEGATIVE NEGATIVE Final    Comment:        The GeneXpert MRSA Assay (FDA approved for NASAL specimens only), is one component of a comprehensive MRSA colonization surveillance program. It is not intended to diagnose MRSA infection nor to guide or monitor treatment for MRSA infections.      Labs: Basic Metabolic Panel:  Recent Labs Lab 07/30/2016 2332 07/17/16 0730 07/18/16 1012  NA 140 146* 152*  K 3.9 2.7* 3.4*  CL 99* 98* 103  CO2 22 33* 31  GLUCOSE 176* 152* 149*  BUN 45* 39* 56*  CREATININE 1.54* 1.19* 1.66*  CALCIUM 9.4 9.3 9.5   Liver Function Tests: No results for input(s): AST, ALT, ALKPHOS, BILITOT, PROT, ALBUMIN in the last 168 hours. No results for input(s): LIPASE, AMYLASE in the last 168 hours. No results for input(s): AMMONIA in the last 168 hours. CBC:  Recent Labs Lab 07/24/2016 2332 07/17/16 0730  07/18/16 1012  WBC 31.3* 19.8* 18.9*  NEUTROABS 28.5*  --   --   HGB 12.7 11.8* 12.7  HCT 39.0 37.0 43.1  MCV 89.2 90.5 94.5  PLT 431* 438* 487*   Cardiac Enzymes: No results for input(s): CKTOTAL, CKMB, CKMBINDEX, TROPONINI in the last 168 hours. BNP: BNP (last 3 results)  Recent Labs  07/09/2016 2332  BNP 464.0*    ProBNP (last 3 results)  Recent Labs  08/09/15 1516  PROBNP 284.0*    CBG: No results for input(s): GLUCAP in the last 168 hours.     SignedDomenic Polite MD.  [facility] 07/19/2016, 11:28 AM

## 2016-07-19 NOTE — Progress Notes (Signed)
Nutrition Brief Note  Chart reviewed. Pt now transitioning to comfort care.  No further nutrition interventions warranted at this time.  Please re-consult as needed.   Lataja Newland A. Lucette Kratz, RD, LDN, CDE Pager: [phone] After hours Pager: [phone]  

## 2016-07-19 NOTE — Consult Note (Signed)
[facility].  Received request from [place] for family interest in [facility]. Chart reviewed. Attempted to meet with patient and son twice yesterday but they were not available during my attempts. Have made CSW [place] aware no [employer] availability at this time. Will continue to follow/update CSW and family if availability changes.   Thank you,  Erling Conte, LCSW [phone]

## 2016-07-20 MED ORDER — WHITE PETROLATUM GEL
Status: AC
  Filled 2016-07-20: qty 1

## 2016-07-21 LAB — CULTURE, BLOOD (ROUTINE X 2)
CULTURE: NO GROWTH
CULTURE: NO GROWTH

## 2016-08-03 NOTE — Progress Notes (Signed)
[facility] notified of patient's death.Not suitable for donation. Referral number [id number]. Representative: Murlean Caller.

## 2016-08-03 NOTE — Progress Notes (Addendum)
Pt seen, minimally responsive, appears to be actively dying, continue comfort care, expect hospital demise today Son at bedside, appreciate Palliative input  Domenic Polite, MD

## 2016-08-03 NOTE — Progress Notes (Signed)
Patient DNR. Unresponsive, no pulse, no respirations. Next of kin present. Time of death - Jul 24, 2016 at 1215. Verified by Niger Jackson RN. Physician notified.    Family reports, they have the belongings.

## 2016-08-03 DEATH — deceased

## 2016-08-31 NOTE — Discharge Summary (Signed)
Death Summary  Bianca Khan [id number] DOB: 20-Jun-1915 DOA: 08-13-16  PCP: Hollace Kinnier, DO  Admit date: August 13, 2016 Date of Death: Aug 18, 2016  Final Diagnoses:  Principal Problem:   Complete heart block (HCC) Active Problems:   Chronic Leukocytosis   Acute combined systolic and diastolic congestive heart failure (HCC)   Cardiorenal syndrome   Protein-calorie malnutrition, severe   Goals of care, counseling/discussion   Encounter for hospice care discussion    History of present illness:  89/F with COPD/CHF/Severe malnutrition, very poor PO intake and ongoing failure to thrive admitted with Complete heart block and CHF exacerbation, has chronic leukocytosis with acute worsening too and AKI  Hospital Course:  Complete heart block (HCC) -Not on AV blocking meds -suspect intrinsic conduction system issue, Last TSH was normal  -Seen by Cards, Not a pacemaker candidate with advanced age ( will be 89 this year), severe malnutrition and ongoing failure to thrive, called and d/w son Kalissa Valazquez 1/14 and 1/16regarding poor prognosis  -Palliative consulted, s/p Family meeting, decision made for Residential Hospice for End of life care, but passed away on the August 18, 2022 in the hospital  Acute on chronic systolic and diastolic CHF - Acute on Chronic Leukocytosis  AKI  Severe Malnutrition -now 40kg and continuing to lose weight, very poor Po intake prior to admission  Hypokalemia  Time: 15min Signed:  Terrel Nesheiwat  [facility] 08/11/2016, 2:35 PM

## 2016-09-11 ENCOUNTER — Ambulatory Visit: Payer: Medicare Other | Admitting: Internal Medicine

## 2016-09-14 ENCOUNTER — Ambulatory Visit: Payer: Medicare Other | Admitting: Internal Medicine
